# Patient Record
Sex: Female | Born: 1945
Health system: Southern US, Community
[De-identification: ages and names within clinical notes are randomized; demographics above are authoritative.]

## PROBLEM LIST (undated history)

## (undated) DIAGNOSIS — M1990 Unspecified osteoarthritis, unspecified site: Secondary | ICD-10-CM

## (undated) DIAGNOSIS — R7302 Impaired glucose tolerance (oral): Secondary | ICD-10-CM

## (undated) DIAGNOSIS — I10 Essential (primary) hypertension: Secondary | ICD-10-CM

## (undated) DIAGNOSIS — Z800 Family history of malignant neoplasm of digestive organs: Secondary | ICD-10-CM

## (undated) DIAGNOSIS — M5416 Radiculopathy, lumbar region: Secondary | ICD-10-CM

## (undated) DIAGNOSIS — M533 Sacrococcygeal disorders, not elsewhere classified: Secondary | ICD-10-CM

## (undated) DIAGNOSIS — K8020 Calculus of gallbladder without cholecystitis without obstruction: Secondary | ICD-10-CM

## (undated) DIAGNOSIS — I6529 Occlusion and stenosis of unspecified carotid artery: Secondary | ICD-10-CM

## (undated) DIAGNOSIS — E785 Hyperlipidemia, unspecified: Secondary | ICD-10-CM

## (undated) DIAGNOSIS — E039 Hypothyroidism, unspecified: Secondary | ICD-10-CM

## (undated) DIAGNOSIS — K219 Gastro-esophageal reflux disease without esophagitis: Secondary | ICD-10-CM

## (undated) DIAGNOSIS — J309 Allergic rhinitis, unspecified: Secondary | ICD-10-CM

## (undated) HISTORY — DX: Hypothyroidism, unspecified: E03.9

## (undated) HISTORY — PX: CHOLECYSTECTOMY OPEN: SUR202

## (undated) HISTORY — DX: Essential (primary) hypertension: I10

## (undated) HISTORY — DX: Occlusion and stenosis of unspecified carotid artery: I65.29

## (undated) HISTORY — DX: Calculus of gallbladder without cholecystitis without obstruction: K80.20

## (undated) HISTORY — DX: Gastro-esophageal reflux disease without esophagitis: K21.9

## (undated) HISTORY — DX: Allergic rhinitis, unspecified: J30.9

## (undated) HISTORY — DX: Family history of malignant neoplasm of digestive organs: Z80.0

## (undated) HISTORY — DX: Radiculopathy, lumbar region: M54.16

## (undated) HISTORY — DX: Impaired glucose tolerance (oral): R73.02

## (undated) HISTORY — DX: Sacrococcygeal disorders, not elsewhere classified: M53.3

## (undated) HISTORY — PX: OTHER SURGICAL HISTORY: SHX169

## (undated) HISTORY — DX: Hyperlipidemia, unspecified: E78.5

---
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
## 1999-05-07 ENCOUNTER — Encounter: Payer: Self-pay | Admitting: Emergency Medicine

## 1999-05-07 ENCOUNTER — Encounter: Admission: RE | Admit: 1999-05-07 | Discharge: 1999-05-07 | Payer: Self-pay | Admitting: Emergency Medicine

## 1999-11-27 DIAGNOSIS — N951 Menopausal and female climacteric states: Secondary | ICD-10-CM | POA: Insufficient documentation

## 1999-12-15 DIAGNOSIS — Q446 Cystic disease of liver: Secondary | ICD-10-CM | POA: Insufficient documentation

## 2000-02-25 HISTORY — PX: CHOLECYSTECTOMY: SHX55

## 2000-09-21 DIAGNOSIS — E669 Obesity, unspecified: Secondary | ICD-10-CM | POA: Insufficient documentation

## 2002-04-17 ENCOUNTER — Encounter: Payer: Self-pay | Admitting: Emergency Medicine

## 2002-04-17 ENCOUNTER — Emergency Department (HOSPITAL_COMMUNITY): Admission: EM | Admit: 2002-04-17 | Discharge: 2002-04-17 | Payer: Self-pay | Admitting: Emergency Medicine

## 2009-04-18 ENCOUNTER — Encounter: Admission: RE | Admit: 2009-04-18 | Discharge: 2009-04-18 | Payer: Self-pay | Admitting: Internal Medicine

## 2009-04-27 ENCOUNTER — Encounter: Admission: RE | Admit: 2009-04-27 | Discharge: 2009-04-27 | Payer: Self-pay | Admitting: Internal Medicine

## 2009-07-20 DIAGNOSIS — Z9049 Acquired absence of other specified parts of digestive tract: Secondary | ICD-10-CM | POA: Insufficient documentation

## 2010-06-14 DIAGNOSIS — Z79899 Other long term (current) drug therapy: Secondary | ICD-10-CM | POA: Insufficient documentation

## 2010-08-03 ENCOUNTER — Emergency Department (HOSPITAL_COMMUNITY)
Admission: EM | Admit: 2010-08-03 | Discharge: 2010-08-03 | Disposition: A | Discharge status: Home / Self Care | Payer: Medicare Other | Attending: Emergency Medicine | Admitting: Emergency Medicine

## 2010-08-03 ENCOUNTER — Emergency Department (HOSPITAL_COMMUNITY): Payer: Medicare Other

## 2010-08-03 DIAGNOSIS — F329 Major depressive disorder, single episode, unspecified: Secondary | ICD-10-CM | POA: Insufficient documentation

## 2010-08-03 DIAGNOSIS — I10 Essential (primary) hypertension: Secondary | ICD-10-CM | POA: Insufficient documentation

## 2010-08-03 DIAGNOSIS — R109 Unspecified abdominal pain: Secondary | ICD-10-CM | POA: Insufficient documentation

## 2010-08-03 DIAGNOSIS — R110 Nausea: Secondary | ICD-10-CM | POA: Insufficient documentation

## 2010-08-03 DIAGNOSIS — F3289 Other specified depressive episodes: Secondary | ICD-10-CM | POA: Insufficient documentation

## 2010-08-03 DIAGNOSIS — T148XXA Other injury of unspecified body region, initial encounter: Secondary | ICD-10-CM | POA: Insufficient documentation

## 2010-08-03 DIAGNOSIS — E039 Hypothyroidism, unspecified: Secondary | ICD-10-CM | POA: Insufficient documentation

## 2010-08-03 DIAGNOSIS — X58XXXA Exposure to other specified factors, initial encounter: Secondary | ICD-10-CM | POA: Insufficient documentation

## 2010-08-03 DIAGNOSIS — R509 Fever, unspecified: Secondary | ICD-10-CM | POA: Insufficient documentation

## 2010-08-03 LAB — CBC
HCT: 40.6 % (ref 36.0–46.0)
Hemoglobin: 13.6 g/dL (ref 12.0–15.0)
MCH: 28.4 pg (ref 26.0–34.0)
MCHC: 33.5 g/dL (ref 30.0–36.0)
MCV: 84.8 fL (ref 78.0–100.0)
Platelets: 254 10*3/uL (ref 150–400)
RBC: 4.79 MIL/uL (ref 3.87–5.11)
RDW: 14.4 % (ref 11.5–15.5)
WBC: 9.6 10*3/uL (ref 4.0–10.5)

## 2010-08-03 LAB — BASIC METABOLIC PANEL
BUN: 12 mg/dL (ref 6–23)
CO2: 25 mEq/L (ref 19–32)
Calcium: 9.9 mg/dL (ref 8.4–10.5)
Chloride: 99 mEq/L (ref 96–112)
Creatinine, Ser: 0.72 mg/dL (ref 0.4–1.2)
GFR calc Af Amer: >60 mL/min (ref >60)
GFR calc non Af Amer: >60 mL/min (ref >60)
Glucose, Bld: 122 mg/dL — ABNORMAL HIGH (ref 70–99)
Potassium: 3.8 mEq/L (ref 3.5–5.1)
Sodium: 135 mEq/L (ref 135–145)

## 2010-08-03 LAB — URINE MICROSCOPIC-ADD ON

## 2010-08-03 LAB — DIFFERENTIAL
Basophils Absolute: 0 10*3/uL (ref 0.0–0.1)
Basophils Relative: 0 % (ref 0–1)
Eosinophils Absolute: 0.1 10*3/uL (ref 0.0–0.7)
Eosinophils Relative: 1 % (ref 0–5)
Lymphocytes Relative: 28 % (ref 12–46)
Lymphs Abs: 2.7 10*3/uL (ref 0.7–4.0)
Monocytes Absolute: 1.1 10*3/uL — ABNORMAL HIGH (ref 0.1–1.0)
Monocytes Relative: 12 % (ref 3–12)
Neutro Abs: 5.7 10*3/uL (ref 1.7–7.7)
Neutrophils Relative %: 59 % (ref 43–77)

## 2010-08-03 LAB — URINALYSIS, ROUTINE W REFLEX MICROSCOPIC
Bilirubin Urine: NEGATIVE
Glucose, UA: NEGATIVE mg/dL
Hgb urine dipstick: NEGATIVE
Ketones, ur: NEGATIVE mg/dL
Nitrite: NEGATIVE
Protein, ur: NEGATIVE mg/dL
Specific Gravity, Urine: 1.013 (ref 1.005–1.030)
Urobilinogen, UA: 0.2 mg/dL (ref 0.0–1.0)
pH: 6 (ref 5.0–8.0)

## 2010-08-03 MED ORDER — IOHEXOL 300 MG/ML  SOLN
100.0000 mL | Freq: Once | INTRAMUSCULAR | Status: AC | PRN
  Administered 2010-08-03: 100 mL via INTRAVENOUS

## 2010-08-05 LAB — URINE CULTURE
Colony Count: NO GROWTH
Culture  Setup Time: 201206100139
Culture: NO GROWTH

## 2010-08-09 ENCOUNTER — Inpatient Hospital Stay (INDEPENDENT_AMBULATORY_CARE_PROVIDER_SITE_OTHER)
Admission: RE | Admit: 2010-08-09 | Discharge: 2010-08-09 | Disposition: A | Discharge status: Home / Self Care | Payer: Medicare Other | Source: Ambulatory Visit | Attending: Family Medicine | Admitting: Family Medicine

## 2010-08-09 ENCOUNTER — Emergency Department (HOSPITAL_COMMUNITY)
Admission: EM | Admit: 2010-08-09 | Discharge: 2010-08-09 | Disposition: A | Discharge status: Home / Self Care | Payer: Medicare Other | Attending: Emergency Medicine | Admitting: Emergency Medicine

## 2010-08-09 DIAGNOSIS — R609 Edema, unspecified: Secondary | ICD-10-CM | POA: Insufficient documentation

## 2010-08-09 DIAGNOSIS — I10 Essential (primary) hypertension: Secondary | ICD-10-CM | POA: Insufficient documentation

## 2010-08-09 DIAGNOSIS — R509 Fever, unspecified: Secondary | ICD-10-CM | POA: Insufficient documentation

## 2010-08-09 DIAGNOSIS — R109 Unspecified abdominal pain: Secondary | ICD-10-CM

## 2010-08-09 DIAGNOSIS — E039 Hypothyroidism, unspecified: Secondary | ICD-10-CM | POA: Insufficient documentation

## 2010-08-09 DIAGNOSIS — R209 Unspecified disturbances of skin sensation: Secondary | ICD-10-CM | POA: Insufficient documentation

## 2010-08-09 LAB — URINALYSIS, ROUTINE W REFLEX MICROSCOPIC
Bilirubin Urine: NEGATIVE
Glucose, UA: NEGATIVE mg/dL
Hgb urine dipstick: NEGATIVE
Ketones, ur: 15 mg/dL — AB
Nitrite: NEGATIVE
Protein, ur: NEGATIVE mg/dL
Specific Gravity, Urine: 1.027 (ref 1.005–1.030)
Urobilinogen, UA: 0.2 mg/dL (ref 0.0–1.0)
pH: 6 (ref 5.0–8.0)

## 2010-08-09 LAB — URINE MICROSCOPIC-ADD ON

## 2010-08-10 ENCOUNTER — Emergency Department (HOSPITAL_COMMUNITY)
Admission: EM | Admit: 2010-08-10 | Disposition: A | Discharge status: Home / Self Care | Payer: Medicare Other | Source: Home / Self Care

## 2010-08-13 ENCOUNTER — Ambulatory Visit (HOSPITAL_COMMUNITY)
Admission: RE | Admit: 2010-08-13 | Discharge: 2010-08-13 | Disposition: A | Discharge status: Home / Self Care | Payer: Medicare Other | Source: Ambulatory Visit | Attending: Emergency Medicine | Admitting: Emergency Medicine

## 2010-08-13 DIAGNOSIS — M7989 Other specified soft tissue disorders: Secondary | ICD-10-CM | POA: Insufficient documentation

## 2010-08-13 DIAGNOSIS — M79609 Pain in unspecified limb: Secondary | ICD-10-CM

## 2010-08-30 ENCOUNTER — Other Ambulatory Visit: Payer: Self-pay | Admitting: Internal Medicine

## 2010-08-30 ENCOUNTER — Other Ambulatory Visit (INDEPENDENT_AMBULATORY_CARE_PROVIDER_SITE_OTHER): Payer: Medicare Other

## 2010-08-30 ENCOUNTER — Ambulatory Visit (INDEPENDENT_AMBULATORY_CARE_PROVIDER_SITE_OTHER): Payer: Medicare Other | Admitting: Internal Medicine

## 2010-08-30 ENCOUNTER — Encounter: Payer: Self-pay | Admitting: Internal Medicine

## 2010-08-30 VITALS — BP 110/80 | HR 92 | Temp 97.2°F | Ht 65.0 in | Wt 226.0 lb

## 2010-08-30 DIAGNOSIS — Z0000 Encounter for general adult medical examination without abnormal findings: Secondary | ICD-10-CM

## 2010-08-30 DIAGNOSIS — K219 Gastro-esophageal reflux disease without esophagitis: Secondary | ICD-10-CM | POA: Insufficient documentation

## 2010-08-30 DIAGNOSIS — I10 Essential (primary) hypertension: Secondary | ICD-10-CM | POA: Insufficient documentation

## 2010-08-30 DIAGNOSIS — Z0001 Encounter for general adult medical examination with abnormal findings: Secondary | ICD-10-CM | POA: Insufficient documentation

## 2010-08-30 DIAGNOSIS — E785 Hyperlipidemia, unspecified: Secondary | ICD-10-CM

## 2010-08-30 DIAGNOSIS — K8020 Calculus of gallbladder without cholecystitis without obstruction: Secondary | ICD-10-CM | POA: Insufficient documentation

## 2010-08-30 DIAGNOSIS — Z23 Encounter for immunization: Secondary | ICD-10-CM

## 2010-08-30 DIAGNOSIS — M5416 Radiculopathy, lumbar region: Secondary | ICD-10-CM

## 2010-08-30 DIAGNOSIS — R7309 Other abnormal glucose: Secondary | ICD-10-CM

## 2010-08-30 DIAGNOSIS — I6529 Occlusion and stenosis of unspecified carotid artery: Secondary | ICD-10-CM | POA: Insufficient documentation

## 2010-08-30 DIAGNOSIS — E079 Disorder of thyroid, unspecified: Secondary | ICD-10-CM | POA: Insufficient documentation

## 2010-08-30 DIAGNOSIS — F419 Anxiety disorder, unspecified: Secondary | ICD-10-CM | POA: Insufficient documentation

## 2010-08-30 DIAGNOSIS — R5381 Other malaise: Secondary | ICD-10-CM

## 2010-08-30 DIAGNOSIS — IMO0002 Reserved for concepts with insufficient information to code with codable children: Secondary | ICD-10-CM

## 2010-08-30 DIAGNOSIS — R531 Weakness: Secondary | ICD-10-CM | POA: Insufficient documentation

## 2010-08-30 DIAGNOSIS — R5383 Other fatigue: Secondary | ICD-10-CM | POA: Insufficient documentation

## 2010-08-30 DIAGNOSIS — E039 Hypothyroidism, unspecified: Secondary | ICD-10-CM | POA: Insufficient documentation

## 2010-08-30 DIAGNOSIS — T7840XA Allergy, unspecified, initial encounter: Secondary | ICD-10-CM | POA: Insufficient documentation

## 2010-08-30 DIAGNOSIS — J309 Allergic rhinitis, unspecified: Secondary | ICD-10-CM | POA: Insufficient documentation

## 2010-08-30 DIAGNOSIS — G43109 Migraine with aura, not intractable, without status migrainosus: Secondary | ICD-10-CM | POA: Insufficient documentation

## 2010-08-30 DIAGNOSIS — R7302 Impaired glucose tolerance (oral): Secondary | ICD-10-CM | POA: Insufficient documentation

## 2010-08-30 HISTORY — DX: Hyperlipidemia, unspecified: E78.5

## 2010-08-30 HISTORY — DX: Radiculopathy, lumbar region: M54.16

## 2010-08-30 LAB — CBC WITH DIFFERENTIAL/PLATELET
Basophils Absolute: 0 10*3/uL (ref 0.0–0.1)
Basophils Relative: 0.3 % (ref 0.0–3.0)
Eosinophils Absolute: 0.2 10*3/uL (ref 0.0–0.7)
Eosinophils Relative: 2.2 % (ref 0.0–5.0)
HCT: 40 % (ref 36.0–46.0)
Hemoglobin: 13.7 g/dL (ref 12.0–15.0)
Lymphocytes Relative: 34.9 % (ref 12.0–46.0)
Lymphs Abs: 2.7 10*3/uL (ref 0.7–4.0)
MCHC: 34.2 g/dL (ref 30.0–36.0)
MCV: 85 fl (ref 78.0–100.0)
Monocytes Absolute: 0.4 10*3/uL (ref 0.1–1.0)
Monocytes Relative: 5.7 % (ref 3.0–12.0)
Neutro Abs: 4.4 10*3/uL (ref 1.4–7.7)
Neutrophils Relative %: 56.9 % (ref 43.0–77.0)
Platelets: 259 10*3/uL (ref 150.0–400.0)
RBC: 4.71 Mil/uL (ref 3.87–5.11)
RDW: 14.6 % (ref 11.5–14.6)
WBC: 7.7 10*3/uL (ref 4.5–10.5)

## 2010-08-30 LAB — HEPATIC FUNCTION PANEL
ALT: 18 U/L (ref 0–35)
AST: 17 U/L (ref 0–37)
Albumin: 4.4 g/dL (ref 3.5–5.2)
Alkaline Phosphatase: 104 U/L (ref 39–117)
Bilirubin, Direct: 0.1 mg/dL (ref 0.0–0.3)
Total Bilirubin: 0.4 mg/dL (ref 0.3–1.2)
Total Protein: 7.7 g/dL (ref 6.0–8.3)

## 2010-08-30 LAB — BASIC METABOLIC PANEL
BUN: 13 mg/dL (ref 6–23)
CO2: 29 mEq/L (ref 19–32)
Calcium: 9.1 mg/dL (ref 8.4–10.5)
Chloride: 108 mEq/L (ref 96–112)
Creatinine, Ser: 0.7 mg/dL (ref 0.4–1.2)
GFR: 92.17 mL/min (ref >60.00)
Glucose, Bld: 105 mg/dL — ABNORMAL HIGH (ref 70–99)
Potassium: 4.4 mEq/L (ref 3.5–5.1)
Sodium: 143 mEq/L (ref 135–145)

## 2010-08-30 LAB — LIPID PANEL
Cholesterol: 264 mg/dL — ABNORMAL HIGH (ref 0–200)
HDL: 51.8 mg/dL (ref >39.00)
Total CHOL/HDL Ratio: 5
Triglycerides: 275 mg/dL — ABNORMAL HIGH (ref 0.0–149.0)
VLDL: 55 mg/dL — ABNORMAL HIGH (ref 0.0–40.0)

## 2010-08-30 LAB — LDL CHOLESTEROL, DIRECT: Direct LDL: 189.9 mg/dL

## 2010-08-30 MED ORDER — PNEUMOCOCCAL VAC POLYVALENT 25 MCG/0.5ML IJ INJ
0.5000 mL | INJECTION | Freq: Once | INTRAMUSCULAR | Status: AC
  Administered 2010-08-30: 0.5 mL via INTRAMUSCULAR

## 2010-08-30 MED ORDER — TRAMADOL HCL 50 MG PO TABS: 50.0000 mg | ORAL_TABLET | Freq: Four times a day (QID) | ORAL | Status: AC | PRN

## 2010-08-30 NOTE — Assessment & Plan Note (Signed)
asympt - for a1c check today,  to f/u any worsening symptoms or concerns , cont diet and wt loss efforts

## 2010-08-30 NOTE — Assessment & Plan Note (Signed)
Not charged today, but due for pneumovax, and colonscopy - will order

## 2010-08-30 NOTE — Progress Notes (Signed)
Subjective:    Patient ID: Jenny Acosta, female    DOB: 03-21-1945, 65 y.o.   MRN: [id number]  HPI  Here to establish, was seen recently at the ER with constipation, now improved, as she is back on her thyroid med.  Has gained 75 lbs on paxil over the yrs.  Pt denies chest pain, increased sob or doe, wheezing, orthopnea, PND, increased LE swelling, palpitations, dizziness or syncope.  Pt denies new neurological symptoms such as new headache, or facial or extremity weakness or numbness   Pt denies polydipsia, polyuria. Pt continues to have recurring mild for 4 yrs now worse mod to severe right LBP/coccyx pain without recnet change in severity, bowel or bladder change, fever, wt loss,  worsening LE pain/numbness/weakness except for mild RLE numb, but no gait change or falls.  Does have some ? Gastric irritation with the ibuprofen (but no hx of PUD).  Has not seen her prev PCP recently, robaxin, valium and percocet taken in the past, but robaxin caused hallucinations and valium not really helpful except for anxiety.  Does have sense of ongoing fatigue, but denies signficant hypersomnolence.  Past Medical History  Diagnosis Date  . Pain, coccyx   . GERD (gastroesophageal reflux disease)   . Hypertension   . Hypothyroidism   . Gallstone   . Carotid stenosis   . Impaired glucose tolerance   . Allergic rhinitis, cause unspecified    Past Surgical History  Procedure Date  . Cholecystectomy 2002  . Appendectomy 1953  . Tonsillectomy 1959  . Scarlet fever     age 84    reports that she has never smoked. She does not have any smokeless tobacco history on file. She reports that she does not drink alcohol or use illicit drugs. family history includes Arthritis in her other; Cancer in her other; Diabetes in her others; Hyperlipidemia in her other; and Stroke in her other. Allergies  Allergen Reactions  . Robaxin Other (See Comments)    hallucinations  . Sulfa Antibiotics    No current outpatient  prescriptions on file prior to visit.   Review of Systems Review of Systems  Constitutional: Negative for diaphoresis and unexpected weight change.  HENT: Negative for drooling and tinnitus.   Eyes: Negative for photophobia and visual disturbance.  Respiratory: Negative for choking and stridor.   Gastrointestinal: Negative for vomiting and blood in stool.  Genitourinary: Negative for hematuria and decreased urine volume.  Musculoskeletal: Negative for gait problem.  Skin: Negative for color change and wound.  Neurological: Negative for tremors and numbness.  Psychiatric/Behavioral: Negative for decreased concentration. The patient is not hyperactive.       Objective:   Physical Exam BP 110/80  Pulse 92  Temp(Src) 97.2 F (36.2 C) (Oral)  Ht 5\' 5"  (1.651 m)  Wt 226 lb (102.513 kg)  BMI 37.61 kg/m2  SpO2 94% Physical Exam  VS noted Constitutional: Pt is oriented to person, place, and time. Appears well-developed and well-nourished.  HENT:  Head: Normocephalic and atraumatic.  Right Ear: External ear normal.  Left Ear: External ear normal.  Nose: Nose normal.  Mouth/Throat: Oropharynx is clear and moist.  Eyes: Conjunctivae and EOM are normal. Pupils are equal, round, and reactive to light.  Neck: Normal range of motion. Neck supple. No JVD present. No tracheal deviation present.  Cardiovascular: Normal rate, regular rhythm, normal heart sounds and intact distal pulses.   Pulmonary/Chest: Effort normal and breath sounds normal.  Abdominal: Soft. Bowel sounds are normal. There  is no tenderness.  Musculoskeletal: Normal range of motion. Exhibits no edema.  Lymphadenopathy:  Has no cervical adenopathy.  Neurological: Pt is alert and oriented to person, place, and time. Pt has normal reflexes. No cranial nerve deficit. Motor/dtr/gait intact; sens decreased to anterior right thigh and medial calf Skin: Skin is warm and dry. No rash noted.  Psychiatric:  Has  normal mood and affect.  Behavior is normal.  2+ nervous       Assessment & Plan:

## 2010-08-30 NOTE — Assessment & Plan Note (Signed)
stable overall by hx and exam, most recent data reviewed with pt, and pt to continue medical treatment as before  BP Readings from Last 3 Encounters:  08/30/10 110/80

## 2010-08-30 NOTE — Patient Instructions (Addendum)
Please stop the ibuprofen Take all new medications as prescribed  - the ultram You will be contacted regarding the referral for: MRI for the lower back, and orthopedic Please remember to followup with your GYN for the yearly pap smear and/or mammogram (consider calling [facility]) Please go to LAB in the Basement for the blood and/or urine tests to be done today Please call the phone number [phone] (the PhoneTree System) for results of testing in 2-3 days;  When calling, simply dial the number, and when prompted enter the MRN number above (the Medical Record Number) and the # key, then the message should start. You will be contacted regarding the referral for: colonoscopy You had the pneumonia shot today Please return in 6 months, or sooner if needed

## 2010-08-30 NOTE — Assessment & Plan Note (Signed)
stable overall by hx and exam, most recent data reviewed with pt, and pt to continue medical treatment as before, to check labs

## 2010-08-30 NOTE — Assessment & Plan Note (Signed)
Etiology unclear, Exam otherwise benign, to check labs as documented, follow with expectant management  

## 2010-08-30 NOTE — Assessment & Plan Note (Signed)
Mild to mod, ongoing for 4 yrs, mild worse with RLE numbness now, for MRI, and ortho evaluation

## 2010-09-02 ENCOUNTER — Other Ambulatory Visit: Payer: Self-pay

## 2010-09-02 ENCOUNTER — Other Ambulatory Visit: Payer: Self-pay | Admitting: Internal Medicine

## 2010-09-02 LAB — TSH: TSH: 0.7 u[IU]/mL (ref 0.35–5.50)

## 2010-09-02 MED ORDER — ATORVASTATIN CALCIUM 20 MG PO TABS: 20.0000 mg | ORAL_TABLET | Freq: Every day | ORAL | Status: DC

## 2010-09-02 MED ORDER — ESCITALOPRAM OXALATE 10 MG PO TABS: 10.0000 mg | ORAL_TABLET | Freq: Every day | ORAL | Status: DC

## 2010-09-02 MED ORDER — LEVOTHYROXINE SODIUM 75 MCG PO TABS: 75.0000 ug | ORAL_TABLET | Freq: Every day | ORAL | Status: DC

## 2010-09-10 ENCOUNTER — Ambulatory Visit
Admission: RE | Admit: 2010-09-10 | Discharge: 2010-09-10 | Disposition: A | Discharge status: Home / Self Care | Payer: Medicare Other | Source: Ambulatory Visit | Attending: Internal Medicine | Admitting: Internal Medicine

## 2010-09-10 DIAGNOSIS — M5416 Radiculopathy, lumbar region: Secondary | ICD-10-CM

## 2010-10-03 ENCOUNTER — Ambulatory Visit: Payer: Medicare Other | Admitting: Internal Medicine

## 2010-11-08 ENCOUNTER — Encounter: Payer: Self-pay | Admitting: Gastroenterology

## 2010-12-05 ENCOUNTER — Other Ambulatory Visit: Payer: Self-pay | Admitting: Internal Medicine

## 2010-12-05 NOTE — Telephone Encounter (Signed)
Done per emr 

## 2010-12-16 ENCOUNTER — Telehealth: Payer: Self-pay

## 2010-12-16 MED ORDER — FLUOXETINE HCL 20 MG PO CAPS: 20.0000 mg | ORAL_CAPSULE | Freq: Every day | ORAL | Status: DC

## 2010-12-16 NOTE — Telephone Encounter (Signed)
Change to prozac ok - done per emr

## 2010-12-16 NOTE — Telephone Encounter (Signed)
Called the patient left message that prescription request had been taken care of and sent to pharmacy.

## 2010-12-16 NOTE — Telephone Encounter (Signed)
The patient called requesting an alternative to Lexapro as is too expensive. She stated that prozac she could afford and would it be a good alternative for lexapro. Please advise, phone number [phone]

## 2011-02-27 ENCOUNTER — Other Ambulatory Visit: Payer: Self-pay | Admitting: Internal Medicine

## 2011-02-27 ENCOUNTER — Other Ambulatory Visit (INDEPENDENT_AMBULATORY_CARE_PROVIDER_SITE_OTHER): Payer: Medicare Other

## 2011-02-27 ENCOUNTER — Encounter: Payer: Self-pay | Admitting: Internal Medicine

## 2011-02-27 ENCOUNTER — Ambulatory Visit (INDEPENDENT_AMBULATORY_CARE_PROVIDER_SITE_OTHER): Payer: Medicare Other | Admitting: Internal Medicine

## 2011-02-27 VITALS — BP 108/72 | HR 90 | Temp 98.9°F | Ht 66.0 in | Wt 222.1 lb

## 2011-02-27 DIAGNOSIS — K219 Gastro-esophageal reflux disease without esophagitis: Secondary | ICD-10-CM

## 2011-02-27 DIAGNOSIS — M549 Dorsalgia, unspecified: Secondary | ICD-10-CM

## 2011-02-27 DIAGNOSIS — F411 Generalized anxiety disorder: Secondary | ICD-10-CM | POA: Diagnosis not present

## 2011-02-27 DIAGNOSIS — Z23 Encounter for immunization: Secondary | ICD-10-CM | POA: Diagnosis not present

## 2011-02-27 DIAGNOSIS — E785 Hyperlipidemia, unspecified: Secondary | ICD-10-CM | POA: Diagnosis not present

## 2011-02-27 DIAGNOSIS — F419 Anxiety disorder, unspecified: Secondary | ICD-10-CM

## 2011-02-27 MED ORDER — TRAMADOL HCL 50 MG PO TABS: 100.0000 mg | ORAL_TABLET | Freq: Four times a day (QID) | ORAL | Status: DC | PRN

## 2011-02-27 MED ORDER — CYCLOBENZAPRINE HCL 5 MG PO TABS: 10.0000 mg | ORAL_TABLET | Freq: Every evening | ORAL | Status: AC | PRN

## 2011-02-27 MED ORDER — OMEPRAZOLE 20 MG PO CPDR: 20.0000 mg | DELAYED_RELEASE_CAPSULE | Freq: Every day | ORAL | Status: DC

## 2011-02-27 MED ORDER — PAROXETINE HCL 20 MG PO TABS: 20.0000 mg | ORAL_TABLET | ORAL | Status: DC

## 2011-02-27 NOTE — Assessment & Plan Note (Signed)
stable overall by hx and exam, most recent data reviewed with pt, not taking statin currently - for lipids, consider statin for ldl > 70

## 2011-02-27 NOTE — Assessment & Plan Note (Signed)
Not controlled with zantac, for PPI tx,  to f/u any worsening symptoms or concerns, consider GI for worsening or persistent s/s such as dysphagia, decliens GI for now

## 2011-02-27 NOTE — Assessment & Plan Note (Signed)
stable overall by hx and exam, most recent data reviewed with pt, and pt to continue medical treatment as before  Lab Results  Component Value Date   WBC 7.7 08/30/2010   HGB 13.7 08/30/2010   HCT 40.0 08/30/2010   PLT 259.0 08/30/2010   GLUCOSE 105* 08/30/2010   CHOL 264* 08/30/2010   TRIG 275.0* 08/30/2010   HDL 51.80 08/30/2010   LDLDIRECT 189.9 08/30/2010   ALT 18 08/30/2010   AST 17 08/30/2010   NA 143 08/30/2010   K 4.4 08/30/2010   CL 108 08/30/2010   CREATININE 0.7 08/30/2010   BUN 13 08/30/2010   CO2 29 08/30/2010   TSH 0.70 08/30/2010   To cont the paxil for now

## 2011-02-27 NOTE — Patient Instructions (Addendum)
You had the flu shot today OK to increase the ultram to 100 mg as needed (new prescription sent to the pharmacy) Ok to stop the lipitor and the lexapro as you have Take all new medications as prescribed - the flexeril muscle relaxer as needed, and the prilosec Please go to LAB in the Basement for the blood and/or urine tests to be done today - the cholesterol only Please call the phone number [phone] (the PhoneTree System) for results of testing in 2-3 days;  When calling, simply dial the number, and when prompted enter the MRN number above (the Medical Record Number) and the # key, then the message should start. Please return in 6 months, or sooner if needed

## 2011-02-27 NOTE — Assessment & Plan Note (Signed)
No neuro change apprec today - for tylenol prn, cont tramadol - consider incr to 100 mg, add flexeril 10 qhs

## 2011-02-27 NOTE — Progress Notes (Signed)
Subjective:    Patient ID: Jenny Acosta, female    DOB: [birth year], 66 y.o.   MRN: [id number]  HPI  Here to f/u; overall doing ok, does have some ongoing left rotater cuff pain, and lately with left post neck stiffness as well, without radiation or increased pain, worse in the am, as well as recurring lower back stiffness as well, worse in the am, but also overnight, hard to find a good position to sleep at night.  Has worsening reflux with very mild sense of trouble swallowing in the last month, where zantac no longer seems to be controlling.  No vomiting, wt loss, abd pain, blood.  Did not start the statin after last visit - trying to work on diet first but admits she has not been able to make signficiant change so far.  Would like to check lipids though.  Also was not able to tolerate wean off paxil and change to prozac as it seemed to make her anxiety worse, so is back on the paxil, though she worries about wt gain, and feels more anxious even to miss the paxil dosing daily by even a few hours.  Pt denies chest pain, increased sob or doe, wheezing, orthopnea, PND, increased LE swelling, palpitations, dizziness or syncope. Pt denies new neurological symptoms such as new headache, or facial or extremity weakness or numbness  Pt denies polydipsia, polyuria Past Medical History  Diagnosis Date  . Pain, coccyx   . GERD (gastroesophageal reflux disease)   . Hypertension   . Hypothyroidism   . Gallstone   . Carotid stenosis   . Impaired glucose tolerance   . Allergic rhinitis, cause unspecified   . Right lumbar radiculopathy 08/30/2010  . Hyperlipidemia 08/30/2010   Past Surgical History  Procedure Date  . Cholecystectomy 2002  . Appendectomy 1953  . Tonsillectomy 1959  . Scarlet fever     age 57    reports that she has never smoked. She does not have any smokeless tobacco history on file. She reports that she does not drink alcohol or use illicit drugs. family history includes Arthritis in her  other; Cancer in her other; Diabetes in her others; Hyperlipidemia in her other; and Stroke in her other. Allergies  Allergen Reactions  . Robaxin Other (See Comments)    hallucinations  . Sulfa Antibiotics    Current Outpatient Prescriptions on File Prior to Visit  Medication Sig Dispense Refill  . Glucosamine-Chondroitin (MOVE FREE PO) Take by mouth daily.        Marland Kitchen levothyroxine (SYNTHROID) 75 MCG tablet Take 1 tablet (75 mcg total) by mouth daily.  30 tablet  11  . Lutein 20 MG CAPS Take by mouth daily.        . ranitidine (ACID REDUCER MAXIMUM STRENGTH) 150 MG tablet Take 150 mg by mouth daily.         aReview of Systems Review of Systems  Constitutional: Negative for diaphoresis and unexpected weight change.  HENT: Negative for drooling and tinnitus.   Eyes: Negative for photophobia and visual disturbance.  Respiratory: Negative for choking and stridor.   Gastrointestinal: Negative for vomiting and blood in stool.  Genitourinary: Negative for hematuria and decreased urine volume.    Objective:   Physical Exam BP 108/72  Pulse 90  Temp(Src) 98.9 F (37.2 C) (Oral)  Ht 5\' 6"  (1.676 m)  Wt 222 lb 2 oz (100.755 kg)  BMI 35.85 kg/m2  SpO2 97% Physical Exam  VS noted Constitutional: Pt appears well-developed  and well-nourished.  HENT: Head: Normocephalic.  Right Ear: External ear normal.  Left Ear: External ear normal.  Eyes: Conjunctivae and EOM are normal. Pupils are equal, round, and reactive to light.  Neck: Normal range of motion. Neck supple.  Cardiovascular: Normal rate and regular rhythm.   Pulmonary/Chest: Effort normal and breath sounds normal.  Abd:  Soft, NT, non-distended, + BS Neurological: Pt is alert. No cranial nerve deficit. motor/sens/dtr intact, spine nontender Skin: Skin is warm. No erythema.  Psychiatric: Pt behavior is normal. Thought content normal. 2+ nervous    Assessment & Plan:

## 2011-02-28 LAB — LIPID PANEL
Cholesterol: 283 mg/dL — ABNORMAL HIGH (ref 0–200)
HDL: 52.6 mg/dL (ref >39.00)
Total CHOL/HDL Ratio: 5
Triglycerides: 225 mg/dL — ABNORMAL HIGH (ref 0.0–149.0)
VLDL: 45 mg/dL — ABNORMAL HIGH (ref 0.0–40.0)

## 2011-02-28 LAB — LDL CHOLESTEROL, DIRECT: Direct LDL: 191.5 mg/dL

## 2011-03-01 ENCOUNTER — Encounter: Payer: Self-pay | Admitting: Internal Medicine

## 2011-03-01 ENCOUNTER — Other Ambulatory Visit: Payer: Self-pay | Admitting: Internal Medicine

## 2011-03-01 MED ORDER — ATORVASTATIN CALCIUM 20 MG PO TABS: 20.0000 mg | ORAL_TABLET | Freq: Every day | ORAL | Status: DC

## 2011-03-06 ENCOUNTER — Telehealth: Payer: Self-pay

## 2011-03-06 MED ORDER — LOVASTATIN 40 MG PO TABS: 40.0000 mg | ORAL_TABLET | Freq: Every day | ORAL | Status: DC

## 2011-03-06 NOTE — Telephone Encounter (Signed)
Ok for lovastatin 40

## 2011-03-06 NOTE — Telephone Encounter (Signed)
Called the patient no answer 

## 2011-03-06 NOTE — Telephone Encounter (Signed)
Patient called requesting less expensive alternative to Lipitor to start on, please advise

## 2011-03-07 NOTE — Telephone Encounter (Signed)
Called the patient no answer and no voice mail 

## 2011-05-15 ENCOUNTER — Telehealth: Payer: Self-pay

## 2011-05-15 NOTE — Telephone Encounter (Signed)
I think ok to cont both meds at this point, as the only medications stronger than tramadol are controlled substances like vicodin and percocet, which I dont think are approp for her case, and I would very much doubt the flexeril is causing the facial swelling

## 2011-05-15 NOTE — Telephone Encounter (Signed)
Patient has taken flexeril and is having facial swelling, please advise on alternative. Also stated the Tramadol 100 mg has helped with the pain,  But needs something stronger. Please advisement on both medications.

## 2011-05-16 NOTE — Telephone Encounter (Signed)
Called left message to call back 

## 2011-05-16 NOTE — Telephone Encounter (Signed)
Patient informed of MD's instructions

## 2011-06-16 ENCOUNTER — Ambulatory Visit: Payer: Medicare Other | Admitting: Internal Medicine

## 2011-07-24 ENCOUNTER — Encounter: Payer: Self-pay | Admitting: Internal Medicine

## 2011-07-24 ENCOUNTER — Ambulatory Visit (INDEPENDENT_AMBULATORY_CARE_PROVIDER_SITE_OTHER): Payer: Medicare Other | Admitting: Internal Medicine

## 2011-07-24 VITALS — BP 124/82 | HR 96 | Temp 99.1°F | Ht 65.0 in | Wt 216.5 lb

## 2011-07-24 DIAGNOSIS — R42 Dizziness and giddiness: Secondary | ICD-10-CM | POA: Diagnosis not present

## 2011-07-24 DIAGNOSIS — E785 Hyperlipidemia, unspecified: Secondary | ICD-10-CM | POA: Diagnosis not present

## 2011-07-24 DIAGNOSIS — J019 Acute sinusitis, unspecified: Secondary | ICD-10-CM | POA: Diagnosis not present

## 2011-07-24 DIAGNOSIS — J069 Acute upper respiratory infection, unspecified: Secondary | ICD-10-CM | POA: Diagnosis not present

## 2011-07-24 DIAGNOSIS — W19XXXA Unspecified fall, initial encounter: Secondary | ICD-10-CM

## 2011-07-24 MED ORDER — MECLIZINE HCL 12.5 MG PO TABS: 12.5000 mg | ORAL_TABLET | Freq: Three times a day (TID) | ORAL | Status: DC | PRN

## 2011-07-24 MED ORDER — TRAMADOL HCL 50 MG PO TABS: 100.0000 mg | ORAL_TABLET | Freq: Four times a day (QID) | ORAL | Status: DC | PRN

## 2011-07-24 MED ORDER — LEVOFLOXACIN 250 MG PO TABS: 250.0000 mg | ORAL_TABLET | Freq: Every day | ORAL | Status: AC

## 2011-07-24 NOTE — Patient Instructions (Signed)
Take all new medications as prescribed  - the antibiotic, and the meclizine for dizziness if needed Continue all other medications as before, including the tramadol OK to stay off the lovastatin for now Please return in 6 months, or sooner if needed

## 2011-07-27 ENCOUNTER — Encounter: Payer: Self-pay | Admitting: Internal Medicine

## 2011-07-27 DIAGNOSIS — Y92009 Unspecified place in unspecified non-institutional (private) residence as the place of occurrence of the external cause: Secondary | ICD-10-CM | POA: Insufficient documentation

## 2011-07-27 DIAGNOSIS — J069 Acute upper respiratory infection, unspecified: Secondary | ICD-10-CM | POA: Insufficient documentation

## 2011-07-27 DIAGNOSIS — W19XXXA Unspecified fall, initial encounter: Secondary | ICD-10-CM | POA: Insufficient documentation

## 2011-07-27 NOTE — Assessment & Plan Note (Signed)
Mild to mod, for antibx course,  to f/u any worsening symptoms or concerns 

## 2011-07-27 NOTE — Assessment & Plan Note (Signed)
Mild to mod, for meclizien prn,  to f/u any worsening symptoms or concerns

## 2011-07-27 NOTE — Progress Notes (Signed)
Subjective:    Patient ID: Jenny Acosta, female    DOB: Jun 10, 1945, 66 y.o.   MRN: [id number]  HPI  Here to f/u;  Overall not taking the lovastatin as we asked after last visit as she was "wary" of potential side effect.  Has had vertigo recurrent intermittent for 3 wks and some fullness to the ears, left > right, but improving, and no vertigo for the last 3 days.  Did accidentrly trip yesterday with a fall forward, today mentions an abrasion to the right palm and contusion to the right knee, with stiffness and discomfort about the knee today.  Is trying to lose wt, now down from 222 to 216.  Pt denies chest pain, increased sob or doe, wheezing, orthopnea, PND, increased LE swelling, palpitations, dizziness or syncope.  Pt denies new neurological symptoms such as new headache, or facial or extremity weakness or numbness   Pt denies polydipsia, polyuria. Past Medical History  Diagnosis Date  . Pain, coccyx   . GERD (gastroesophageal reflux disease)   . Hypertension   . Hypothyroidism   . Gallstone   . Carotid stenosis   . Impaired glucose tolerance   . Allergic rhinitis, cause unspecified   . Right lumbar radiculopathy 08/30/2010  . Hyperlipidemia 08/30/2010   Past Surgical History  Procedure Date  . Cholecystectomy 2002  . Appendectomy 1953  . Tonsillectomy 1959  . Scarlet fever     age 46    reports that she has never smoked. She does not have any smokeless tobacco history on file. She reports that she does not drink alcohol or use illicit drugs. family history includes Arthritis in her other; Cancer in her other; Diabetes in her others; Hyperlipidemia in her other; and Stroke in her other. Allergies  Allergen Reactions  . Methocarbamol Other (See Comments)    hallucinations  . Sulfa Antibiotics    Current Outpatient Prescriptions on File Prior to Visit  Medication Sig Dispense Refill  . Glucosamine-Chondroitin (MOVE FREE PO) Take by mouth daily.        Marland Kitchen levothyroxine (SYNTHROID)  75 MCG tablet Take 1 tablet (75 mcg total) by mouth daily.  30 tablet  11  . Lutein 20 MG CAPS Take by mouth daily.        Marland Kitchen PARoxetine (PAXIL) 20 MG tablet Take 1 tablet (20 mg total) by mouth every morning.  90 tablet  3  . lovastatin (MEVACOR) 40 MG tablet Take 1 tablet (40 mg total) by mouth at bedtime.  90 tablet  3  . omeprazole (PRILOSEC) 20 MG capsule Take 1 capsule (20 mg total) by mouth daily.  30 capsule  11  . ranitidine (ACID REDUCER MAXIMUM STRENGTH) 150 MG tablet Take 150 mg by mouth daily.         Review of Systems Review of Systems  Constitutional: Negative for diaphoresis and unexpected weight change.  HENT: Negative for drooling and tinnitus.   Eyes: Negative for photophobia and visual disturbance.  Respiratory: Negative for choking and stridor.   Gastrointestinal: Negative for vomiting and blood in stool.  Genitourinary: Negative for hematuria and decreased urine volume.  Skin: Negative for color change and wound.  Neurological: Negative for tremors and numbness.     Objective:   Physical Exam BP 124/82  Pulse 96  Temp(Src) 99.1 F (37.3 C) (Oral)  Ht 5\' 5"  (1.651 m)  Wt 216 lb 8 oz (98.204 kg)  BMI 36.03 kg/m2  SpO2 97% Physical Exam  VS noted Constitutional: Pt  appears well-developed and well-nourished.  HENT: Head: Normocephalic.  Right Ear: External ear normal.  Left Ear: External ear normal.  Bilat tm's mild erythema left > right.  Sinus nontender.  Pharynx mild erythema Eyes: Conjunctivae and EOM are normal. Pupils are equal, round, and reactive to light.  Neck: Normal range of motion. Neck supple.  Cardiovascular: Normal rate and regular rhythm.   Pulmonary/Chest: Effort normal and breath sounds normal.  Neurological: Pt is alert. Not confused Skin: Skin is warm. No erythema. no rash but has 1-2 cm abrasion without cellulitis to the right wrist/palm, also 2 cm right knee contusion with mild tender, swelling Psychiatric: Pt behavior is normal. Thought  content normal.     Assessment & Plan:

## 2011-07-27 NOTE — Assessment & Plan Note (Signed)
Yesterday, accidental, with right wrist abrasion/right knee contusion, benign exam, no films needed, for tylenol prn, pt reassured,  to f/u any worsening symptoms or concerns

## 2011-07-27 NOTE — Assessment & Plan Note (Signed)
Given her carotid dz , goal LDL < 70, but declines statin at this time, cont low chol diet,  to f/u any worsening symptoms or concerns

## 2011-08-11 ENCOUNTER — Telehealth: Payer: Self-pay | Admitting: *Deleted

## 2011-08-11 MED ORDER — AZITHROMYCIN 250 MG PO TABS: ORAL_TABLET | ORAL | Status: AC

## 2011-08-11 NOTE — Telephone Encounter (Signed)
Done per emr 

## 2011-08-11 NOTE — Telephone Encounter (Signed)
Pt left vm stating she finished her abx last week. She still c/o earache and sinus issues. She is requesting a different abx.

## 2011-08-12 NOTE — Telephone Encounter (Signed)
Called left detailed message antibiotic sent to pharmacy

## 2011-09-22 ENCOUNTER — Other Ambulatory Visit: Payer: Self-pay | Admitting: Internal Medicine

## 2011-10-02 ENCOUNTER — Other Ambulatory Visit: Payer: Self-pay | Admitting: Internal Medicine

## 2011-10-10 ENCOUNTER — Telehealth: Payer: Self-pay

## 2011-10-10 MED ORDER — TIZANIDINE HCL 4 MG PO TABS: 4.0000 mg | ORAL_TABLET | Freq: Four times a day (QID) | ORAL | Status: AC | PRN

## 2011-10-10 NOTE — Telephone Encounter (Signed)
Patient is taking tramadol (100mg  ) and tylenol every 6 hours for arthritis pain. Her pain has increased and having muscle spasms.  Please advise on muscle relaxer or more meds. Please advise.  Call back number is [phone], [facility].

## 2011-10-10 NOTE — Telephone Encounter (Signed)
Pt has had rxn to robaxin, but should do ok with tizanidine prn - done erx

## 2011-10-13 ENCOUNTER — Telehealth: Payer: Self-pay

## 2011-10-13 NOTE — Telephone Encounter (Signed)
Called the patient left detailed message that prescription requested has been sent in.

## 2011-10-13 NOTE — Telephone Encounter (Signed)
Received PA approval for cyclobenzaprine through February 24, 2012. ID# [id number].

## 2011-11-27 ENCOUNTER — Encounter (HOSPITAL_COMMUNITY): Payer: Self-pay | Admitting: Emergency Medicine

## 2011-11-27 ENCOUNTER — Emergency Department (HOSPITAL_COMMUNITY): Payer: Medicare Other

## 2011-11-27 ENCOUNTER — Emergency Department (HOSPITAL_COMMUNITY)
Admission: EM | Admit: 2011-11-27 | Discharge: 2011-11-27 | Disposition: A | Discharge status: Home / Self Care | Payer: Medicare Other | Attending: Emergency Medicine | Admitting: Emergency Medicine

## 2011-11-27 DIAGNOSIS — I10 Essential (primary) hypertension: Secondary | ICD-10-CM | POA: Diagnosis not present

## 2011-11-27 DIAGNOSIS — M542 Cervicalgia: Secondary | ICD-10-CM | POA: Insufficient documentation

## 2011-11-27 DIAGNOSIS — R112 Nausea with vomiting, unspecified: Secondary | ICD-10-CM | POA: Diagnosis not present

## 2011-11-27 DIAGNOSIS — M129 Arthropathy, unspecified: Secondary | ICD-10-CM | POA: Diagnosis not present

## 2011-11-27 DIAGNOSIS — K219 Gastro-esophageal reflux disease without esophagitis: Secondary | ICD-10-CM | POA: Insufficient documentation

## 2011-11-27 DIAGNOSIS — R0602 Shortness of breath: Secondary | ICD-10-CM | POA: Insufficient documentation

## 2011-11-27 DIAGNOSIS — M79609 Pain in unspecified limb: Secondary | ICD-10-CM | POA: Insufficient documentation

## 2011-11-27 DIAGNOSIS — G319 Degenerative disease of nervous system, unspecified: Secondary | ICD-10-CM | POA: Diagnosis not present

## 2011-11-27 DIAGNOSIS — E785 Hyperlipidemia, unspecified: Secondary | ICD-10-CM | POA: Insufficient documentation

## 2011-11-27 DIAGNOSIS — I6789 Other cerebrovascular disease: Secondary | ICD-10-CM | POA: Diagnosis not present

## 2011-11-27 DIAGNOSIS — Z79899 Other long term (current) drug therapy: Secondary | ICD-10-CM | POA: Insufficient documentation

## 2011-11-27 DIAGNOSIS — R079 Chest pain, unspecified: Secondary | ICD-10-CM | POA: Insufficient documentation

## 2011-11-27 DIAGNOSIS — R42 Dizziness and giddiness: Secondary | ICD-10-CM | POA: Diagnosis not present

## 2011-11-27 DIAGNOSIS — E039 Hypothyroidism, unspecified: Secondary | ICD-10-CM | POA: Diagnosis not present

## 2011-11-27 HISTORY — DX: Unspecified osteoarthritis, unspecified site: M19.90

## 2011-11-27 LAB — COMPREHENSIVE METABOLIC PANEL
ALT: 18 U/L (ref 0–35)
AST: 17 U/L (ref 0–37)
Albumin: 3.7 g/dL (ref 3.5–5.2)
Alkaline Phosphatase: 134 U/L — ABNORMAL HIGH (ref 39–117)
BUN: 7 mg/dL (ref 6–23)
CO2: 24 mEq/L (ref 19–32)
Calcium: 9.7 mg/dL (ref 8.4–10.5)
Chloride: 102 mEq/L (ref 96–112)
Creatinine, Ser: 0.57 mg/dL (ref 0.50–1.10)
GFR calc Af Amer: >90 mL/min (ref >90)
GFR calc non Af Amer: >90 mL/min (ref >90)
Glucose, Bld: 136 mg/dL — ABNORMAL HIGH (ref 70–99)
Potassium: 3.5 mEq/L (ref 3.5–5.1)
Sodium: 138 mEq/L (ref 135–145)
Total Bilirubin: 0.2 mg/dL — ABNORMAL LOW (ref 0.3–1.2)
Total Protein: 7.3 g/dL (ref 6.0–8.3)

## 2011-11-27 LAB — CBC WITH DIFFERENTIAL/PLATELET
Basophils Absolute: 0 10*3/uL (ref 0.0–0.1)
Basophils Relative: 0 % (ref 0–1)
Eosinophils Absolute: 0.1 10*3/uL (ref 0.0–0.7)
Eosinophils Relative: 2 % (ref 0–5)
HCT: 43.5 % (ref 36.0–46.0)
Hemoglobin: 14.3 g/dL (ref 12.0–15.0)
Lymphocytes Relative: 44 % (ref 12–46)
Lymphs Abs: 2.7 10*3/uL (ref 0.7–4.0)
MCH: 27.9 pg (ref 26.0–34.0)
MCHC: 32.9 g/dL (ref 30.0–36.0)
MCV: 85 fL (ref 78.0–100.0)
Monocytes Absolute: 0.5 10*3/uL (ref 0.1–1.0)
Monocytes Relative: 8 % (ref 3–12)
Neutro Abs: 2.8 10*3/uL (ref 1.7–7.7)
Neutrophils Relative %: 46 % (ref 43–77)
Platelets: 241 10*3/uL (ref 150–400)
RBC: 5.12 MIL/uL — ABNORMAL HIGH (ref 3.87–5.11)
RDW: 15 % (ref 11.5–15.5)
WBC: 6.1 10*3/uL (ref 4.0–10.5)

## 2011-11-27 LAB — POCT I-STAT TROPONIN I: Troponin i, poc: 0 ng/mL (ref 0.00–0.08)

## 2011-11-27 MED ORDER — MECLIZINE HCL 25 MG PO TABS
25.0000 mg | ORAL_TABLET | Freq: Once | ORAL | Status: AC
  Administered 2011-11-27: 25 mg via ORAL
  Filled 2011-11-27: qty 1

## 2011-11-27 MED ORDER — MECLIZINE HCL 25 MG PO TABS: 25.0000 mg | ORAL_TABLET | Freq: Four times a day (QID) | ORAL | Status: DC

## 2011-11-27 NOTE — ED Notes (Signed)
Pt reports that she has been having dizziness, L arm pain, cp since yesterday; but reports long standing hx of same with tendonitis in L shoulder, and has been on meclizine in the past for dizziness; pt a&ox4; neuro intact

## 2011-11-27 NOTE — ED Provider Notes (Addendum)
History     CSN: [id number]  Arrival date & time 11/27/11  1630   First MD Initiated Contact with Patient 11/27/11 1813      Chief Complaint  Patient presents with  . Dizziness  . Shortness of Breath  . Chest Pain    (Consider location/radiation/quality/duration/timing/severity/associated sxs/prior treatment) Patient is a 66 y.o. female presenting with shortness of breath and chest pain. The history is provided by the patient and a relative.  Shortness of Breath  Associated symptoms include chest pain and shortness of breath. Pertinent negatives include no fever.  Chest Pain Primary symptoms include shortness of breath, nausea, vomiting and dizziness. Pertinent negatives for primary symptoms include no fever and no abdominal pain.  Dizziness also occurs with nausea and vomiting. Dizziness does not occur with weakness.   Pertinent negatives for associated symptoms include no weakness.    patient's main complaint is room spinning and vertigo that started yesterday she is also had some left arm pain for the past 2 days she's had some pain in that arm for a long period time but worse in the past 2 days. No real anterior chest pain. With the dizziness there has been nausea and vomiting. Patient's had a history dizziness in the past treated by her primary care Dr. with Lezlie Octave but never really had true room spinning. Also 4 months ago had a left-sided ear infection that required several treatments to clear up. The left arm pain is more of an ache he just in the left arm. Scribed at about 2/10. Nursing note noted some middle portion chest pain now patient didn't relate any of that to [place].  Past Medical History  Diagnosis Date  . Pain, coccyx   . GERD (gastroesophageal reflux disease)   . Hypertension   . Hypothyroidism   . Gallstone   . Carotid stenosis   . Impaired glucose tolerance   . Allergic rhinitis, cause unspecified   . Right lumbar radiculopathy 08/30/2010  . Hyperlipidemia  08/30/2010  . Arthritis     Past Surgical History  Procedure Date  . Cholecystectomy 2002  . Appendectomy 1953  . Tonsillectomy 1959  . Scarlet fever     age 45  . Cholecystectomy open 10 years ago    Family History  Problem Relation Age of Onset  . Cancer Other     colon cancer  . Stroke Other   . Diabetes Other   . Arthritis Other   . Hyperlipidemia Other   . Diabetes Other     History  Substance Use Topics  . Smoking status: Never Smoker   . Smokeless tobacco: Not on file  . Alcohol Use: No    OB History    Grav Para Term Preterm Abortions TAB SAB Ect Mult Living                  Review of Systems  Constitutional: Negative for fever.  HENT: Positive for neck pain. Negative for ear pain and congestion.   Eyes: Negative for visual disturbance.  Respiratory: Positive for shortness of breath.   Cardiovascular: Positive for chest pain.  Gastrointestinal: Positive for nausea and vomiting. Negative for abdominal pain.  Genitourinary: Negative for dysuria.  Musculoskeletal: Negative for back pain.  Skin: Negative for rash.  Neurological: Positive for dizziness. Negative for speech difficulty, weakness and headaches.  Hematological: Does not bruise/bleed easily.    Allergies  Methocarbamol and Sulfa antibiotics  Home Medications   Current Outpatient Rx  Name Route Sig Dispense  Refill  . ACETAMINOPHEN 500 MG PO TABS Oral Take 500 mg by mouth every 6 (six) hours as needed. For pain    . LEVOTHYROXINE SODIUM 75 MCG PO TABS  TAKE 1 TABLET (75 MCG TOTAL) BY MOUTH DAILY. 30 tablet 10  . LORATADINE 10 MG PO TABS Oral Take 10 mg by mouth daily.    Marland Kitchen LOVASTATIN 40 MG PO TABS Oral Take 1 tablet (40 mg total) by mouth at bedtime. 90 tablet 3  . PAROXETINE HCL 20 MG PO TABS Oral Take 1 tablet (20 mg total) by mouth every morning. 90 tablet 3  . TRAMADOL HCL 50 MG PO TABS Oral Take 100 mg by mouth every 6 (six) hours as needed.    Marland Kitchen MOVE FREE PO Oral Take by mouth daily.       Marland Kitchen LEVOTHYROXINE SODIUM 75 MCG PO TABS Oral Take 1 tablet (75 mcg total) by mouth daily. 30 tablet 11  . LUTEIN 20 MG PO CAPS Oral Take by mouth daily.      Marland Kitchen MECLIZINE HCL 25 MG PO TABS Oral Take 1 tablet (25 mg total) by mouth 4 (four) times daily. 28 tablet 0  . OMEPRAZOLE 20 MG PO CPDR Oral Take 1 capsule (20 mg total) by mouth daily. 30 capsule 11  . RANITIDINE HCL 150 MG PO TABS Oral Take 150 mg by mouth daily.        BP 126/80  Pulse 79  Temp 98.1 F (36.7 C) (Oral)  Resp 15  SpO2 98%  Physical Exam  Nursing note and vitals reviewed. Constitutional: She is oriented to person, place, and time. She appears well-developed and well-nourished.  HENT:  Head: Normocephalic and atraumatic.  Right Ear: External ear normal.  Left Ear: External ear normal.  Mouth/Throat: Oropharynx is clear and moist.  Eyes: Conjunctivae normal and EOM are normal. Pupils are equal, round, and reactive to light.  Neck: Normal range of motion. Neck supple.  Cardiovascular: Normal rate, regular rhythm and normal heart sounds.   Pulmonary/Chest: Effort normal and breath sounds normal. No respiratory distress.  Abdominal: Soft. Bowel sounds are normal. There is no tenderness.  Musculoskeletal: Normal range of motion. She exhibits no edema.  Neurological: She is alert and oriented to person, place, and time. No cranial nerve deficit. She exhibits normal muscle tone. Coordination normal.  Skin: Skin is warm. No rash noted.    ED Course  Procedures (including critical care time)  Labs Reviewed  CBC WITH DIFFERENTIAL - Abnormal; Notable for the following:    RBC 5.12 (*)     All other components within normal limits  COMPREHENSIVE METABOLIC PANEL - Abnormal; Notable for the following:    Glucose, Bld 136 (*)     Alkaline Phosphatase 134 (*)     Total Bilirubin 0.2 (*)     All other components within normal limits  POCT I-STAT TROPONIN I   Dg Chest 2 View  11/27/2011  *RADIOLOGY REPORT*  Clinical  Data: Dizziness and shortness of breath.  CHEST - 2 VIEW  Comparison: None  Findings: The cardiac silhouette, mediastinal and hilar contours are normal.  Slightly prominent paratracheal soft tissue density is likely ectatic vasculature or thyroid gland.  No infiltrates, edema or effusions.  There is a button artifact noted over the left upper chest.  The bony thorax is intact.  IMPRESSION: No acute cardiopulmonary findings.   Original Report Authenticated By: P. Loralie Champagne, M.D.    Ct Head Wo Contrast  11/27/2011  *  RADIOLOGY REPORT*  Clinical Data: Dizziness and short of breath  CT HEAD WITHOUT CONTRAST  Technique:  Contiguous axial images were obtained from the base of the skull through the vertex without contrast.  Comparison: MRI 04/27/2009  Findings: Mild to moderate cerebral atrophy.  Negative for hydrocephalus.  Patchy hypodensity in the cerebral white matter bilaterally is unchanged.  No acute infarct, hemorrhage, or mass lesion.  Calvarium intact.  Mild   sinusitis.  IMPRESSION: Atrophy and chronic microvascular ischemic changes in the white matter.  No acute abnormality.   Original Report Authenticated By: Camelia Phenes, M.D.    Results for orders placed during the hospital encounter of 11/27/11  CBC WITH DIFFERENTIAL      Component Value Range   WBC 6.1  4.0 - 10.5 K/uL   RBC 5.12 (*) 3.87 - 5.11 MIL/uL   Hemoglobin 14.3  12.0 - 15.0 g/dL   HCT 40.9  81.1 - 91.4 %   MCV 85.0  78.0 - 100.0 fL   MCH 27.9  26.0 - 34.0 pg   MCHC 32.9  30.0 - 36.0 g/dL   RDW 78.2  95.6 - 21.3 %   Platelets 241  150 - 400 K/uL   Neutrophils Relative 46  43 - 77 %   Neutro Abs 2.8  1.7 - 7.7 K/uL   Lymphocytes Relative 44  12 - 46 %   Lymphs Abs 2.7  0.7 - 4.0 K/uL   Monocytes Relative 8  3 - 12 %   Monocytes Absolute 0.5  0.1 - 1.0 K/uL   Eosinophils Relative 2  0 - 5 %   Eosinophils Absolute 0.1  0.0 - 0.7 K/uL   Basophils Relative 0  0 - 1 %   Basophils Absolute 0.0  0.0 - 0.1 K/uL    COMPREHENSIVE METABOLIC PANEL      Component Value Range   Sodium 138  135 - 145 mEq/L   Potassium 3.5  3.5 - 5.1 mEq/L   Chloride 102  96 - 112 mEq/L   CO2 24  19 - 32 mEq/L   Glucose, Bld 136 (*) 70 - 99 mg/dL   BUN 7  6 - 23 mg/dL   Creatinine, Ser 0.86  0.50 - 1.10 mg/dL   Calcium 9.7  8.4 - 57.8 mg/dL   Total Protein 7.3  6.0 - 8.3 g/dL   Albumin 3.7  3.5 - 5.2 g/dL   AST 17  0 - 37 U/L   ALT 18  0 - 35 U/L   Alkaline Phosphatase 134 (*) 39 - 117 U/L   Total Bilirubin 0.2 (*) 0.3 - 1.2 mg/dL   GFR calc non Af Amer >90  >90 mL/min   GFR calc Af Amer >90  >90 mL/min  POCT I-STAT TROPONIN I      Component Value Range   Troponin i, poc 0.00  0.00 - 0.08 ng/mL   Comment 3             Date: 11/27/2011  Rate: 89  Rhythm: normal sinus rhythm  QRS Axis: normal  Intervals: normal  ST/T Wave abnormalities: nonspecific ST/T changes  Conduction Disutrbances:none  Narrative Interpretation:   Old EKG Reviewed: none available     1. Vertigo       MDM  Patient clinically seems to be having a positional vertigo certainly reproducible by moving her head left to right. CT without significant findings patient also had some left arm pain that's been ongoing for a number  of days the troponin is negative EKG had no acute changes chest x-ray is negative. Patient improved with Antivert here in the emergency department we'll continue that at home and followup with her primary care Dr. No evidence of acute or tightness at this point in time.        Shelda Jakes, MD 11/27/11  Shelda Jakes, MD 11/27/11 2141

## 2011-11-27 NOTE — ED Notes (Signed)
Pt. States that this episode began yesterday.  She has has dizziness, SOB and pain in the middle portion of her chest and toward her left arm; however, she states that she has had rotator cuff issues in her left arm so she is not sure if that left arm pain may be that.  Patient appears stable at this time.;

## 2011-12-05 ENCOUNTER — Telehealth: Payer: Self-pay

## 2011-12-05 MED ORDER — TRAMADOL HCL 50 MG PO TABS: 50.0000 mg | ORAL_TABLET | Freq: Four times a day (QID) | ORAL | Status: DC | PRN

## 2011-12-05 NOTE — Telephone Encounter (Signed)
The patient called requesting her tramadol to be filled as is almost out.  Please send to [facility].

## 2011-12-05 NOTE — Telephone Encounter (Signed)
Done erx 

## 2011-12-05 NOTE — Telephone Encounter (Signed)
Called the pt. Left detailed msg. That rx requested has been sent in.

## 2011-12-08 ENCOUNTER — Ambulatory Visit (INDEPENDENT_AMBULATORY_CARE_PROVIDER_SITE_OTHER): Payer: Medicare Other | Admitting: Internal Medicine

## 2011-12-08 ENCOUNTER — Encounter: Payer: Self-pay | Admitting: Internal Medicine

## 2011-12-08 VITALS — BP 114/80 | HR 94 | Temp 96.6°F | Ht 65.5 in | Wt 202.4 lb

## 2011-12-08 DIAGNOSIS — R221 Localized swelling, mass and lump, neck: Secondary | ICD-10-CM

## 2011-12-08 DIAGNOSIS — Z0000 Encounter for general adult medical examination without abnormal findings: Secondary | ICD-10-CM | POA: Diagnosis not present

## 2011-12-08 DIAGNOSIS — H9319 Tinnitus, unspecified ear: Secondary | ICD-10-CM | POA: Diagnosis not present

## 2011-12-08 DIAGNOSIS — R1011 Right upper quadrant pain: Secondary | ICD-10-CM | POA: Insufficient documentation

## 2011-12-08 DIAGNOSIS — F411 Generalized anxiety disorder: Secondary | ICD-10-CM

## 2011-12-08 DIAGNOSIS — R220 Localized swelling, mass and lump, head: Secondary | ICD-10-CM

## 2011-12-08 DIAGNOSIS — H9190 Unspecified hearing loss, unspecified ear: Secondary | ICD-10-CM | POA: Diagnosis not present

## 2011-12-08 DIAGNOSIS — R42 Dizziness and giddiness: Secondary | ICD-10-CM

## 2011-12-08 DIAGNOSIS — F419 Anxiety disorder, unspecified: Secondary | ICD-10-CM

## 2011-12-08 DIAGNOSIS — H9192 Unspecified hearing loss, left ear: Secondary | ICD-10-CM

## 2011-12-08 MED ORDER — PAROXETINE HCL 20 MG PO TABS: 30.0000 mg | ORAL_TABLET | ORAL | Status: DC

## 2011-12-08 NOTE — Patient Instructions (Addendum)
Continue all other medications as before You will be contacted regarding the referral for: ENT for the left ear hearing, tinnitus, vertigo and the ? Of lump of the right neck You will be contacted regarding the referral for: abdomen ultrasound due to the ongoing pain and weight loss You will be contacted regarding the referral for: colonoscopy OK to increase the Paxil to 30 mg per day No need for further blood work today Please return in 3 months, or sooner if needed

## 2011-12-08 NOTE — Assessment & Plan Note (Addendum)
?   MSK , but with recent wt loss and persistent pain will need abd u/s; also for screening colonoscopy Lab Results  Component Value Date   WBC 6.1 11/27/2011   HGB 14.3 11/27/2011   HCT 43.5 11/27/2011   PLT 241 11/27/2011   GLUCOSE 136* 11/27/2011   CHOL 283* 02/27/2011   TRIG 225.0* 02/27/2011   HDL 52.60 02/27/2011   LDLDIRECT 191.5 02/27/2011   ALT 18 11/27/2011   AST 17 11/27/2011   NA 138 11/27/2011   K 3.5 11/27/2011   CL 102 11/27/2011   CREATININE 0.57 11/27/2011   BUN 7 11/27/2011   CO2 24 11/27/2011   TSH 0.70 08/30/2010

## 2011-12-08 NOTE — Assessment & Plan Note (Signed)
With hearing loss, tinnitus - for ENT referral - ? menieres

## 2011-12-08 NOTE — Assessment & Plan Note (Signed)
Not charged, for colonscopy as she is due, now willing to do

## 2011-12-08 NOTE — Assessment & Plan Note (Signed)
Ok for increased paxil to 30 mg,  to f/u any worsening symptoms or concerns

## 2011-12-08 NOTE — Progress Notes (Signed)
Subjective:    Patient ID: Jenny Acosta, female    DOB: 1946/02/06, 66 y.o.   MRN: [id number]  HPI  Here to f/u, has ongoing depression with low mood, decreased appetite and noted 14 lbs wt loss seeming unintentional since may 2013, and still persistent RUQ pain as per that visit as well, no real change.  Has not yet had her colonoscopy for screening, but now willing to have done.  Also has several months left hearing loss, tinnitus like odd sounds and recurrent vertigo without HA, sinus symtpoms, ST, cough and Pt denies chest pain, increased sob or doe, wheezing, orthopnea, PND, increased LE swelling, palpitations, dizziness or syncope.   Pt denies polydipsia, polyuria.  Pt denies new neurological symptoms such as new headache, or facial or extremity weakness or numbness. Also with c/o lump to right neck near mid jaw line, some improved in size last few days Past Medical History  Diagnosis Date  . Pain, coccyx   . GERD (gastroesophageal reflux disease)   . Hypertension   . Hypothyroidism   . Gallstone   . Carotid stenosis   . Impaired glucose tolerance   . Allergic rhinitis, cause unspecified   . Right lumbar radiculopathy 08/30/2010  . Hyperlipidemia 08/30/2010  . Arthritis    Past Surgical History  Procedure Date  . Cholecystectomy 2002  . Appendectomy 1953  . Tonsillectomy 1959  . Scarlet fever     age 57  . Cholecystectomy open 10 years ago    reports that she has never smoked. She does not have any smokeless tobacco history on file. She reports that she does not drink alcohol or use illicit drugs. family history includes Arthritis in her other; Cancer in her other; Diabetes in her others; Hyperlipidemia in her other; and Stroke in her other. Allergies  Allergen Reactions  . Methocarbamol Other (See Comments)    hallucinations  . Sulfa Antibiotics     unknown   Current Outpatient Prescriptions on File Prior to Visit  Medication Sig Dispense Refill  . acetaminophen (TYLENOL)  500 MG tablet Take 500 mg by mouth every 6 (six) hours as needed. For pain      . levothyroxine (SYNTHROID, LEVOTHROID) 75 MCG tablet TAKE 1 TABLET (75 MCG TOTAL) BY MOUTH DAILY.  30 tablet  10  . loratadine (CLARITIN) 10 MG tablet Take 10 mg by mouth daily.      . meclizine (ANTIVERT) 25 MG tablet Take 1 tablet (25 mg total) by mouth 4 (four) times daily.  28 tablet  0  . traMADol (ULTRAM) 50 MG tablet Take 1 tablet (50 mg total) by mouth every 6 (six) hours as needed.  120 tablet  1  . DISCONTD: PARoxetine (PAXIL) 20 MG tablet Take 1 tablet (20 mg total) by mouth every morning.  90 tablet  3  . Glucosamine-Chondroitin (MOVE FREE PO) Take by mouth daily.        Marland Kitchen levothyroxine (SYNTHROID) 75 MCG tablet Take 1 tablet (75 mcg total) by mouth daily.  30 tablet  11  . lovastatin (MEVACOR) 40 MG tablet Take 1 tablet (40 mg total) by mouth at bedtime.  90 tablet  3  . Lutein 20 MG CAPS Take by mouth daily.        Marland Kitchen omeprazole (PRILOSEC) 20 MG capsule Take 1 capsule (20 mg total) by mouth daily.  30 capsule  11  . ranitidine (ACID REDUCER MAXIMUM STRENGTH) 150 MG tablet Take 150 mg by mouth daily.  Review of Systems  Constitutional: Negative for diaphoresis and unexpected weight change.  Eyes: Negative for photophobia and visual disturbance.  Respiratory: Negative for choking and stridor.   Gastrointestinal: Negative for vomiting and blood in stool.  Genitourinary: Negative for hematuria and decreased urine volume.  Musculoskeletal: Negative for gait problem.  Skin: Negative for color change and wound.  Neurological: Negative for tremors and numbness.  Psychiatric/Behavioral: Negative for decreased concentration. The patient is not hyperactive.       Objective:   Physical Exam BP 114/80  Pulse 94  Temp 96.6 F (35.9 C) (Oral)  Ht 5' 5.5" (1.664 m)  Wt 202 lb 6 oz (91.797 kg)  BMI 33.16 kg/m2  SpO2 95% Physical Exam  VS noted Constitutional: Pt appears well-developed and  well-nourished.  HENT: Head: Normocephalic. With < 1 cm subq nodular mass to right neck near mid jaw line Right Ear: External ear normal.  Left Ear: External ear normal.  Bilat TM's clear, canals clear without red, tender Eyes: Conjunctivae and EOM are normal. Pupils are equal, round, and reactive to light.  Neck: Normal range of motion. Neck supple.  Cardiovascular: Normal rate and regular rhythm.   Pulmonary/Chest: Effort normal and breath sounds normal.  Abd:  Soft, NT, non-distended, + BS except for persistent right costal margin tender without guarding or rebound Neurological: Pt is alert. Not confused  Skin: Skin is warm. No erythema.  Psychiatric: Pt behavior is normal. Thought content normal.     Assessment & Plan:

## 2011-12-08 NOTE — Assessment & Plan Note (Signed)
Right side, small, also for ent opinion

## 2011-12-09 ENCOUNTER — Telehealth: Payer: Self-pay

## 2011-12-09 NOTE — Telephone Encounter (Signed)
Message copied by Pincus Sanes on Tue Dec 09, 2011  3:14 PM ------      Message from: [name]      Created: Mon Dec 08, 2011  5:22 PM       Sorry, this would not be appropriate            I would say though the radiation involved is about what you get with a CXR, not like what you would get with a CT scan;  I would encourage her to call for a yearly mammogram, such as [facility]      ----- Message -----         From: Vladimir Crofts Ewing         Sent: 12/08/2011   4:41 PM           To: [name], MD            This patient stated she has never had a mammogram due to the radiation.  She wondered if while they do the abdominal US could they as well check her Breast??

## 2011-12-09 NOTE — Telephone Encounter (Signed)
Called the patient informed of MD instructions.  She stated she would schedule her mammogram

## 2011-12-12 ENCOUNTER — Other Ambulatory Visit: Payer: Self-pay | Admitting: *Deleted

## 2011-12-12 MED ORDER — MECLIZINE HCL 25 MG PO TABS: 25.0000 mg | ORAL_TABLET | Freq: Four times a day (QID) | ORAL | Status: DC

## 2011-12-12 MED ORDER — TIZANIDINE HCL 2 MG PO CAPS: 2.0000 mg | ORAL_CAPSULE | Freq: Three times a day (TID) | ORAL | Status: DC

## 2011-12-12 NOTE — Telephone Encounter (Signed)
medication refilled and sent to [facility] as patient requested.

## 2011-12-15 ENCOUNTER — Encounter: Payer: Self-pay | Admitting: Gastroenterology

## 2011-12-15 ENCOUNTER — Other Ambulatory Visit: Payer: Self-pay | Admitting: Internal Medicine

## 2011-12-15 DIAGNOSIS — Z1231 Encounter for screening mammogram for malignant neoplasm of breast: Secondary | ICD-10-CM

## 2011-12-16 DIAGNOSIS — H814 Vertigo of central origin: Secondary | ICD-10-CM | POA: Diagnosis not present

## 2011-12-16 DIAGNOSIS — H9190 Unspecified hearing loss, unspecified ear: Secondary | ICD-10-CM | POA: Diagnosis not present

## 2011-12-16 DIAGNOSIS — H9319 Tinnitus, unspecified ear: Secondary | ICD-10-CM | POA: Diagnosis not present

## 2011-12-17 ENCOUNTER — Other Ambulatory Visit: Payer: Medicare Other

## 2012-01-14 ENCOUNTER — Ambulatory Visit
Admission: RE | Admit: 2012-01-14 | Discharge: 2012-01-14 | Disposition: A | Discharge status: Home / Self Care | Payer: Medicare Other | Source: Ambulatory Visit | Attending: Internal Medicine | Admitting: Internal Medicine

## 2012-01-14 DIAGNOSIS — R1011 Right upper quadrant pain: Secondary | ICD-10-CM | POA: Diagnosis not present

## 2012-01-14 DIAGNOSIS — R634 Abnormal weight loss: Secondary | ICD-10-CM | POA: Diagnosis not present

## 2012-01-16 ENCOUNTER — Ambulatory Visit: Payer: Medicare Other

## 2012-01-26 ENCOUNTER — Other Ambulatory Visit: Payer: Self-pay | Admitting: Internal Medicine

## 2012-02-11 ENCOUNTER — Other Ambulatory Visit: Payer: Medicare Other | Admitting: Gastroenterology

## 2012-02-16 ENCOUNTER — Other Ambulatory Visit: Payer: Self-pay | Admitting: Internal Medicine

## 2012-02-16 NOTE — Telephone Encounter (Signed)
Refill tramadol done erx  Robin to let pt know since the rx is early;  Try not to use more than 4 per day

## 2012-02-17 ENCOUNTER — Telehealth: Payer: Self-pay

## 2012-02-17 NOTE — Telephone Encounter (Signed)
Patient called to inform she has been taking tramadol 2 every 6 hours and her rx filled on 01/27/12 lasted only 20 days.  Per MD ok to fill tramadol sent in on 02/16/12 early (informed pharmacist who stated she would have to pay cash as insurance will not pay). Please advise

## 2012-02-17 NOTE — Telephone Encounter (Signed)
Pharmacy informed.

## 2012-02-17 NOTE — Telephone Encounter (Signed)
Ok to fill early  I dont have any other suggestions at this time

## 2012-03-16 ENCOUNTER — Telehealth: Payer: Self-pay

## 2012-03-16 ENCOUNTER — Other Ambulatory Visit: Payer: Self-pay | Admitting: Internal Medicine

## 2012-03-16 NOTE — Telephone Encounter (Signed)
The patient called to inform Tramadol is not helping with her pain. She does need advise as to increase the medication, do an injection or refer to pain clinic?  She is having problems sleeping as well.  Call back number for this patient is [phone]

## 2012-03-16 NOTE — Telephone Encounter (Signed)
Her pain is from her broken tail bone (from many yrs. Ago), severe arthritis in spine and pain radiates to hip and legs.

## 2012-03-16 NOTE — Telephone Encounter (Signed)
Does she mean her right side abd pain, or back pain or other pain?  If abdominal, we should consider refer to GI

## 2012-03-16 NOTE — Telephone Encounter (Signed)
i think would be good to see orthopedic for the back pain - is this OK?

## 2012-03-16 NOTE — Telephone Encounter (Signed)
The patient has seen an orthopedic in the past.  She will call them to schedule appointment as they stated they could do a cortisone injection.

## 2012-03-16 NOTE — Telephone Encounter (Signed)
Ok   I will close note

## 2012-03-17 NOTE — Telephone Encounter (Signed)
Done erx 

## 2012-03-30 ENCOUNTER — Other Ambulatory Visit: Payer: Self-pay | Admitting: Internal Medicine

## 2012-04-05 ENCOUNTER — Telehealth: Payer: Self-pay

## 2012-04-05 DIAGNOSIS — M5416 Radiculopathy, lumbar region: Secondary | ICD-10-CM

## 2012-04-05 NOTE — Telephone Encounter (Signed)
Done per emr 

## 2012-04-05 NOTE — Telephone Encounter (Signed)
The patient would like a referral to Dr. Aliene Beams.  She has transportation only M/W/F please schedule one of those days.

## 2012-04-10 ENCOUNTER — Other Ambulatory Visit: Payer: Self-pay

## 2012-04-13 DIAGNOSIS — M545 Low back pain, unspecified: Secondary | ICD-10-CM | POA: Diagnosis not present

## 2012-04-13 DIAGNOSIS — IMO0002 Reserved for concepts with insufficient information to code with codable children: Secondary | ICD-10-CM | POA: Diagnosis not present

## 2012-04-14 ENCOUNTER — Other Ambulatory Visit: Payer: Self-pay | Admitting: Neurosurgery

## 2012-04-14 DIAGNOSIS — M545 Low back pain, unspecified: Secondary | ICD-10-CM

## 2012-04-15 ENCOUNTER — Other Ambulatory Visit: Payer: Self-pay | Admitting: Internal Medicine

## 2012-04-15 NOTE — Telephone Encounter (Signed)
Done erx 

## 2012-04-20 ENCOUNTER — Other Ambulatory Visit: Payer: Medicare Other

## 2012-05-03 ENCOUNTER — Other Ambulatory Visit: Payer: Self-pay | Admitting: Internal Medicine

## 2012-06-11 ENCOUNTER — Other Ambulatory Visit: Payer: Self-pay | Admitting: Internal Medicine

## 2012-07-21 ENCOUNTER — Other Ambulatory Visit: Payer: Self-pay | Admitting: Internal Medicine

## 2012-07-26 ENCOUNTER — Other Ambulatory Visit: Payer: Self-pay | Admitting: Internal Medicine

## 2012-08-10 ENCOUNTER — Other Ambulatory Visit: Payer: Self-pay | Admitting: Internal Medicine

## 2012-08-24 ENCOUNTER — Other Ambulatory Visit: Payer: Self-pay | Admitting: Internal Medicine

## 2012-09-16 ENCOUNTER — Other Ambulatory Visit: Payer: Self-pay | Admitting: Internal Medicine

## 2012-09-23 ENCOUNTER — Other Ambulatory Visit: Payer: Self-pay | Admitting: Internal Medicine

## 2012-09-24 ENCOUNTER — Other Ambulatory Visit: Payer: Self-pay | Admitting: Internal Medicine

## 2012-09-27 NOTE — Telephone Encounter (Signed)
paxil done erx  Due for yearly visit oct 2014

## 2012-10-01 ENCOUNTER — Other Ambulatory Visit: Payer: Self-pay | Admitting: Internal Medicine

## 2012-10-06 ENCOUNTER — Other Ambulatory Visit: Payer: Self-pay | Admitting: Internal Medicine

## 2012-10-07 NOTE — Telephone Encounter (Signed)
Faxed hardcopy to [facility] [place] [place]

## 2012-10-07 NOTE — Telephone Encounter (Signed)
Done hardcopy to robin  

## 2012-10-11 ENCOUNTER — Other Ambulatory Visit: Payer: Self-pay | Admitting: Internal Medicine

## 2012-10-11 NOTE — Telephone Encounter (Signed)
Faxed hardcopy to [facility] 

## 2012-10-21 ENCOUNTER — Other Ambulatory Visit: Payer: Self-pay | Admitting: Internal Medicine

## 2012-10-22 ENCOUNTER — Other Ambulatory Visit: Payer: Self-pay | Admitting: Internal Medicine

## 2012-11-23 ENCOUNTER — Other Ambulatory Visit: Payer: Self-pay | Admitting: Internal Medicine

## 2012-11-26 ENCOUNTER — Ambulatory Visit: Payer: Medicare Other | Admitting: Internal Medicine

## 2012-12-01 ENCOUNTER — Ambulatory Visit: Payer: Medicare Other | Admitting: Internal Medicine

## 2012-12-08 ENCOUNTER — Other Ambulatory Visit: Payer: Self-pay | Admitting: Internal Medicine

## 2012-12-08 ENCOUNTER — Ambulatory Visit: Payer: Medicare Other | Admitting: Internal Medicine

## 2012-12-08 DIAGNOSIS — Z0289 Encounter for other administrative examinations: Secondary | ICD-10-CM

## 2012-12-08 NOTE — Telephone Encounter (Signed)
Done hardcopy to robin  

## 2012-12-09 NOTE — Telephone Encounter (Signed)
Faxed hardcopy to [facility] [place] 

## 2012-12-17 ENCOUNTER — Ambulatory Visit: Payer: Medicare Other | Admitting: Internal Medicine

## 2012-12-20 ENCOUNTER — Other Ambulatory Visit: Payer: Self-pay | Admitting: Internal Medicine

## 2012-12-21 ENCOUNTER — Other Ambulatory Visit: Payer: Self-pay | Admitting: Internal Medicine

## 2012-12-22 ENCOUNTER — Ambulatory Visit: Payer: Medicare Other | Admitting: Internal Medicine

## 2012-12-22 DIAGNOSIS — Z0289 Encounter for other administrative examinations: Secondary | ICD-10-CM

## 2012-12-30 ENCOUNTER — Other Ambulatory Visit: Payer: Self-pay

## 2013-01-13 DIAGNOSIS — IMO0002 Reserved for concepts with insufficient information to code with codable children: Secondary | ICD-10-CM | POA: Diagnosis not present

## 2013-01-13 DIAGNOSIS — M545 Low back pain, unspecified: Secondary | ICD-10-CM | POA: Diagnosis not present

## 2013-01-18 ENCOUNTER — Telehealth: Payer: Self-pay | Admitting: Internal Medicine

## 2013-01-18 ENCOUNTER — Ambulatory Visit (INDEPENDENT_AMBULATORY_CARE_PROVIDER_SITE_OTHER): Payer: Medicare Other

## 2013-01-18 ENCOUNTER — Encounter: Payer: Self-pay | Admitting: Internal Medicine

## 2013-01-18 ENCOUNTER — Ambulatory Visit (INDEPENDENT_AMBULATORY_CARE_PROVIDER_SITE_OTHER): Payer: Medicare Other | Admitting: Internal Medicine

## 2013-01-18 VITALS — BP 132/80 | HR 87 | Temp 98.1°F | Ht 65.0 in | Wt 193.2 lb

## 2013-01-18 DIAGNOSIS — M25519 Pain in unspecified shoulder: Secondary | ICD-10-CM

## 2013-01-18 DIAGNOSIS — I658 Occlusion and stenosis of other precerebral arteries: Secondary | ICD-10-CM | POA: Diagnosis not present

## 2013-01-18 DIAGNOSIS — R7302 Impaired glucose tolerance (oral): Secondary | ICD-10-CM

## 2013-01-18 DIAGNOSIS — I6529 Occlusion and stenosis of unspecified carotid artery: Secondary | ICD-10-CM

## 2013-01-18 DIAGNOSIS — M2550 Pain in unspecified joint: Secondary | ICD-10-CM

## 2013-01-18 DIAGNOSIS — M79641 Pain in right hand: Secondary | ICD-10-CM

## 2013-01-18 DIAGNOSIS — E785 Hyperlipidemia, unspecified: Secondary | ICD-10-CM

## 2013-01-18 DIAGNOSIS — F411 Generalized anxiety disorder: Secondary | ICD-10-CM

## 2013-01-18 DIAGNOSIS — M79642 Pain in left hand: Secondary | ICD-10-CM | POA: Insufficient documentation

## 2013-01-18 DIAGNOSIS — R7309 Other abnormal glucose: Secondary | ICD-10-CM | POA: Diagnosis not present

## 2013-01-18 DIAGNOSIS — M25511 Pain in right shoulder: Secondary | ICD-10-CM

## 2013-01-18 DIAGNOSIS — M79609 Pain in unspecified limb: Secondary | ICD-10-CM

## 2013-01-18 DIAGNOSIS — I6523 Occlusion and stenosis of bilateral carotid arteries: Secondary | ICD-10-CM

## 2013-01-18 DIAGNOSIS — I10 Essential (primary) hypertension: Secondary | ICD-10-CM

## 2013-01-18 DIAGNOSIS — F419 Anxiety disorder, unspecified: Secondary | ICD-10-CM

## 2013-01-18 DIAGNOSIS — H9190 Unspecified hearing loss, unspecified ear: Secondary | ICD-10-CM

## 2013-01-18 LAB — CBC WITH DIFFERENTIAL/PLATELET
Basophils Absolute: 0 10*3/uL (ref 0.0–0.1)
Basophils Relative: 0.3 % (ref 0.0–3.0)
Eosinophils Absolute: 0 10*3/uL (ref 0.0–0.7)
Eosinophils Relative: 0.2 % (ref 0.0–5.0)
HCT: 42.4 % (ref 36.0–46.0)
Hemoglobin: 14.2 g/dL (ref 12.0–15.0)
Lymphocytes Relative: 34.6 % (ref 12.0–46.0)
Lymphs Abs: 3.9 10*3/uL (ref 0.7–4.0)
MCHC: 33.5 g/dL (ref 30.0–36.0)
MCV: 84.5 fl (ref 78.0–100.0)
Monocytes Absolute: 1 10*3/uL (ref 0.1–1.0)
Monocytes Relative: 8.6 % (ref 3.0–12.0)
Neutro Abs: 6.3 10*3/uL (ref 1.4–7.7)
Neutrophils Relative %: 56.3 % (ref 43.0–77.0)
Platelets: 289 10*3/uL (ref 150.0–400.0)
RBC: 5.02 Mil/uL (ref 3.87–5.11)
RDW: 14.6 % (ref 11.5–14.6)
WBC: 11.3 10*3/uL — ABNORMAL HIGH (ref 4.5–10.5)

## 2013-01-18 LAB — HEMOGLOBIN A1C: Hgb A1c MFr Bld: 5.5 % (ref 4.6–6.5)

## 2013-01-18 MED ORDER — PAROXETINE HCL 30 MG PO TABS: 30.0000 mg | ORAL_TABLET | Freq: Every day | ORAL | Status: DC

## 2013-01-18 MED ORDER — TRAMADOL HCL 50 MG PO TABS: ORAL_TABLET | ORAL | Status: DC

## 2013-01-18 NOTE — Telephone Encounter (Signed)
Referral done per emr 

## 2013-01-18 NOTE — Progress Notes (Signed)
Pre-visit discussion using our clinic review tool. No additional management support is needed unless otherwise documented below in the visit note.  

## 2013-01-18 NOTE — Patient Instructions (Addendum)
Please take all new medication as prescribed - to finish the prednisone OK to increase the paxil to 30 mg per day Please continue all other medications as before, and refills have been done if requested Please have the pharmacy call with any other refills you may need.  Please keep your appointments with your specialists as you may have planned - orthopedic for the lower back., and the MRI as you mentioned for next wk  You will be contacted regarding the referral for: hand surgury, carotid dopplers, Dr Katrinka Blazing in this office for the shoulders, and Rheumatology for "polyarthralgia" to rule out inflammatory arthritis  Please go to the LAB in the Basement (turn left off the elevator) for the tests to be done today, but not the B12 and Vit D, as the computer will not allow this to be done (says has already been done this year)  You will be contacted by phone if any changes need to be made immediately.  Otherwise, you will receive a letter about your results with an explanation, but please check with MyChart first.  Please remember to sign up for My Chart if you have not done so, as this will be important to you in the future with finding out test results, communicating by private email, and scheduling acute appointments online when needed.

## 2013-01-18 NOTE — Assessment & Plan Note (Signed)
stable overall by history and exam, recent data reviewed with pt, and pt to continue medical treatment as before,  to f/u any worsening symptoms or concerns, for f/u labs today Lab Results  Component Value Date   WBC 6.1 11/27/2011   HGB 14.3 11/27/2011   HCT 43.5 11/27/2011   PLT 241 11/27/2011   GLUCOSE 136* 11/27/2011   CHOL 283* 02/27/2011   TRIG 225.0* 02/27/2011   HDL 52.60 02/27/2011   LDLDIRECT 191.5 02/27/2011   ALT 18 11/27/2011   AST 17 11/27/2011   NA 138 11/27/2011   K 3.5 11/27/2011   CL 102 11/27/2011   CREATININE 0.57 11/27/2011   BUN 7 11/27/2011   CO2 24 11/27/2011   TSH 0.70 08/30/2010

## 2013-01-18 NOTE — Telephone Encounter (Signed)
Message copied by [name] on Tue Jan 18, 2013  6:43 PM ------      Message from: Pincus Sanes      Created: Tue Jan 18, 2013  5:26 PM       The patient is due to have another hearing test and was not sure if a referral was necessary.   ------

## 2013-01-18 NOTE — Assessment & Plan Note (Signed)
With palmar tendonitis, for hand surgury referral

## 2013-01-18 NOTE — Progress Notes (Signed)
Subjective:    Patient ID: Jenny Acosta, female    DOB: [birth year], 67 y.o.   MRN: [id number]  HPI  Here to f/u after recently seen per DO physician with mult arthritic complaints including neck, shoulders, hips, knees, hands and feet with some swelling(?) overall improved with predpack.  Denies worsening depressive symptoms, suicidal ideation, or panic; has ongoing anxiety, not increased recently, needs paxil refilled.  Pt continues to have recurring LBP without change in severity, bowel or bladder change, fever, wt loss,  worsening LE pain/numbness/weakness, gait change or falls, has been seeing ortho - has MRI sched for next wk.  Also with specific right hand third finger tendon pain, nodularity.  Also recalls some plaque seen on  Results of carotid u/s on Mychart.  Had mild right shoudler rot cuff issue last yr but now both shoulders worse than before with pain Past Medical History  Diagnosis Date  . Pain, coccyx   . GERD (gastroesophageal reflux disease)   . Hypertension   . Hypothyroidism   . Gallstone   . Carotid stenosis   . Impaired glucose tolerance   . Allergic rhinitis, cause unspecified   . Right lumbar radiculopathy 08/30/2010  . Hyperlipidemia 08/30/2010  . Arthritis    Past Surgical History  Procedure Laterality Date  . Cholecystectomy  2002  . Appendectomy  1953  . Tonsillectomy  1959  . Scarlet fever      age 37  . Cholecystectomy open  10 years ago    reports that she has never smoked. She does not have any smokeless tobacco history on file. She reports that she does not drink alcohol or use illicit drugs. family history includes Arthritis in her other; Cancer in her other; Diabetes in her other and other; Hyperlipidemia in her other; Stroke in her other. Allergies  Allergen Reactions  . Methocarbamol Other (See Comments)    hallucinations  . Sulfa Antibiotics     unknown   Current Outpatient Prescriptions on File Prior to Visit  Medication Sig Dispense Refill   . acetaminophen (TYLENOL) 500 MG tablet Take 500 mg by mouth every 6 (six) hours as needed. For pain      . levothyroxine (SYNTHROID, LEVOTHROID) 75 MCG tablet TAKE 1 TABLET (75 MCG TOTAL) BY MOUTH DAILY.  30 tablet  10  . loratadine (CLARITIN) 10 MG tablet Take 10 mg by mouth daily.      . meclizine (ANTIVERT) 25 MG tablet TAKE 1 TABLET BY MOUTH 4 TIMES DAILY.  120 tablet  0  . tiZANidine (ZANAFLEX) 2 MG tablet TAKE 1 TABLET BY MOUTH 3 TIMES A DAY  90 tablet  0  . levothyroxine (SYNTHROID) 75 MCG tablet Take 1 tablet (75 mcg total) by mouth daily.  30 tablet  11  . lovastatin (MEVACOR) 40 MG tablet Take 1 tablet (40 mg total) by mouth at bedtime.  90 tablet  3  . omeprazole (PRILOSEC) 20 MG capsule Take 1 capsule (20 mg total) by mouth daily.  30 capsule  11   No current facility-administered medications on file prior to visit.    Review of Systems  Constitutional: Negative for unexpected weight change, or unusual diaphoresis  HENT: Negative for tinnitus.   Eyes: Negative for photophobia and visual disturbance.  Respiratory: Negative for choking and stridor.   Gastrointestinal: Negative for vomiting and blood in stool.  Genitourinary: Negative for hematuria and decreased urine volume.  Musculoskeletal: Negative for acute joint swelling Skin: Negative for color change and wound.  Neurological: Negative for tremors and numbness other than noted  Psychiatric/Behavioral: Negative for decreased concentration or  hyperactivity.       Objective:   Physical Exam BP 132/80  Pulse 87  Temp(Src) 98.1 F (36.7 C) (Oral)  Ht 5\' 5"  (1.651 m)  Wt 193 lb 4 oz (87.658 kg)  BMI 32.16 kg/m2  SpO2 96% VS noted, non toxic Constitutional: Pt appears well-developed and well-nourished.  HENT: Head: NCAT.  Right Ear: External ear normal.  Left Ear: External ear normal.  Eyes: Conjunctivae and EOM are normal. Pupils are equal, round, and reactive to light.  Neck: Normal range of motion. Neck  supple.  Cardiovascular: Normal rate and regular rhythm.   Pulmonary/Chest: Effort normal and breath sounds normal.  Abd:  Soft, NT, non-distended, + BS No acute synovitis Right palmar tendinosis noted, tender Bilat shoudlers with reduced ROM, tender No carotid bruit noted bilat Neurological: Pt is alert. Not confused  Skin: Skin is warm. No erythema.  Psychiatric: Pt behavior is normal. Thought content normal.     Assessment & Plan:

## 2013-01-18 NOTE — Assessment & Plan Note (Signed)
stable overall by history and exam, recent data reviewed with pt, and pt to continue medical treatment as before,  to f/u any worsening symptoms or concerns Lab Results  Component Value Date   CHOL 283* 02/27/2011   HDL 52.60 02/27/2011   LDLDIRECT 191.5 02/27/2011   TRIG 225.0* 02/27/2011   CHOLHDL 5 02/27/2011   For f/u lab, goal ldl < 100

## 2013-01-18 NOTE — Assessment & Plan Note (Signed)
stable overall by history and exam, recent data reviewed with pt, and pt to continue medical treatment as before,  to f/u any worsening symptoms or concerns BP Readings from Last 3 Encounters:  01/18/13 132/80  12/08/11 114/80  11/27/11 133/43

## 2013-01-18 NOTE — Assessment & Plan Note (Signed)
Asympt, cont asa, for f/u carotid dopplers

## 2013-01-18 NOTE — Assessment & Plan Note (Signed)
Ok for paxil refill at 30 mg per day, has done with this recently

## 2013-01-18 NOTE — Assessment & Plan Note (Addendum)
?   Inflammatory arthrisi, for labs, finish predpack, refer rheumatology  Note:  Total time for pt hx, exam, review of record with pt in the room, determination of diagnoses and plan for further eval and tx is > 40 min, with over 50% spent in coordination and counseling of patient

## 2013-01-18 NOTE — Assessment & Plan Note (Signed)
prob recurrent rot cuff dz - for sport med referral

## 2013-01-19 ENCOUNTER — Other Ambulatory Visit: Payer: Self-pay | Admitting: Internal Medicine

## 2013-01-19 LAB — BASIC METABOLIC PANEL
BUN: 13 mg/dL (ref 6–23)
CO2: 25 mEq/L (ref 19–32)
Calcium: 9.1 mg/dL (ref 8.4–10.5)
Chloride: 105 mEq/L (ref 96–112)
Creatinine, Ser: 0.6 mg/dL (ref 0.4–1.2)
GFR: 98.13 mL/min (ref >60.00)
Glucose, Bld: 95 mg/dL (ref 70–99)
Potassium: 3.5 mEq/L (ref 3.5–5.1)
Sodium: 139 mEq/L (ref 135–145)

## 2013-01-19 LAB — URINALYSIS, ROUTINE W REFLEX MICROSCOPIC
Bilirubin Urine: NEGATIVE
Hgb urine dipstick: NEGATIVE
Leukocytes, UA: NEGATIVE
Nitrite: NEGATIVE
Specific Gravity, Urine: >=1.03 (ref 1.000–1.030)
Total Protein, Urine: NEGATIVE
Urine Glucose: NEGATIVE
Urobilinogen, UA: 0.2 (ref 0.0–1.0)
pH: 6 (ref 5.0–8.0)

## 2013-01-19 LAB — LIPID PANEL
Cholesterol: 306 mg/dL — ABNORMAL HIGH (ref 0–200)
HDL: 64.3 mg/dL (ref >39.00)
Total CHOL/HDL Ratio: 5
Triglycerides: 106 mg/dL (ref 0.0–149.0)
VLDL: 21.2 mg/dL (ref 0.0–40.0)

## 2013-01-19 LAB — RHEUMATOID FACTOR: Rhuematoid fact SerPl-aCnc: <10 IU/mL (ref <=14)

## 2013-01-19 LAB — HEPATIC FUNCTION PANEL
ALT: 14 U/L (ref 0–35)
AST: 15 U/L (ref 0–37)
Albumin: 4 g/dL (ref 3.5–5.2)
Alkaline Phosphatase: 86 U/L (ref 39–117)
Bilirubin, Direct: 0 mg/dL (ref 0.0–0.3)
Total Bilirubin: 0.5 mg/dL (ref 0.3–1.2)
Total Protein: 7.5 g/dL (ref 6.0–8.3)

## 2013-01-19 LAB — TSH: TSH: 1.5 u[IU]/mL (ref 0.35–5.50)

## 2013-01-19 LAB — SEDIMENTATION RATE: Sed Rate: 15 mm/hr (ref 0–22)

## 2013-01-19 LAB — LDL CHOLESTEROL, DIRECT: Direct LDL: 231.6 mg/dL

## 2013-01-19 MED ORDER — ATORVASTATIN CALCIUM 20 MG PO TABS: 20.0000 mg | ORAL_TABLET | Freq: Every day | ORAL | Status: DC

## 2013-01-19 NOTE — Telephone Encounter (Signed)
Called left message that referral is done.

## 2013-01-21 LAB — ANA: Anti Nuclear Antibody(ANA): NEGATIVE

## 2013-02-01 ENCOUNTER — Other Ambulatory Visit: Payer: Self-pay | Admitting: Internal Medicine

## 2013-02-01 ENCOUNTER — Ambulatory Visit: Payer: Medicare Other | Admitting: Family Medicine

## 2013-02-03 ENCOUNTER — Telehealth: Payer: Self-pay | Admitting: *Deleted

## 2013-02-03 NOTE — Telephone Encounter (Signed)
Patient informed of MD instructions.  The patient would like to know if she could have anything else for pain during the times the tramadol does not work.

## 2013-02-03 NOTE — Telephone Encounter (Signed)
Sorry, I no longer prescribe the hydrocodone or oxycodone meds due to recent change in [place] law and [facility] board regulations, she can take otc alleve with the tramadol if needed

## 2013-02-03 NOTE — Telephone Encounter (Signed)
Pt called requesting whether Dr Jonny Ruiz has made a Dx from the tests.  Please advise

## 2013-02-03 NOTE — Telephone Encounter (Signed)
Not specifically, although I still hope she sees Rheumatology as referred.

## 2013-02-03 NOTE — Telephone Encounter (Signed)
Called the patient left a detailed message on phone number on phone note as well as number listed in chart.

## 2013-02-04 ENCOUNTER — Ambulatory Visit: Payer: Medicare Other | Admitting: Family Medicine

## 2013-02-09 ENCOUNTER — Encounter: Payer: Self-pay | Admitting: Family Medicine

## 2013-02-09 ENCOUNTER — Ambulatory Visit (INDEPENDENT_AMBULATORY_CARE_PROVIDER_SITE_OTHER): Payer: Medicare Other | Admitting: Family Medicine

## 2013-02-09 ENCOUNTER — Other Ambulatory Visit (INDEPENDENT_AMBULATORY_CARE_PROVIDER_SITE_OTHER): Payer: Medicare Other

## 2013-02-09 ENCOUNTER — Telehealth: Payer: Self-pay | Admitting: Internal Medicine

## 2013-02-09 VITALS — BP 134/82 | HR 103 | Wt 195.0 lb

## 2013-02-09 DIAGNOSIS — M7550 Bursitis of unspecified shoulder: Secondary | ICD-10-CM

## 2013-02-09 DIAGNOSIS — M25512 Pain in left shoulder: Secondary | ICD-10-CM

## 2013-02-09 DIAGNOSIS — M75100 Unspecified rotator cuff tear or rupture of unspecified shoulder, not specified as traumatic: Secondary | ICD-10-CM | POA: Diagnosis not present

## 2013-02-09 DIAGNOSIS — M25519 Pain in unspecified shoulder: Secondary | ICD-10-CM | POA: Diagnosis not present

## 2013-02-09 DIAGNOSIS — M5416 Radiculopathy, lumbar region: Secondary | ICD-10-CM

## 2013-02-09 DIAGNOSIS — IMO0002 Reserved for concepts with insufficient information to code with codable children: Secondary | ICD-10-CM

## 2013-02-09 DIAGNOSIS — M25511 Pain in right shoulder: Secondary | ICD-10-CM

## 2013-02-09 NOTE — Assessment & Plan Note (Signed)
Patient did give history of back pain. Patient was given home exercise program try first. This was not fully evaluated today.

## 2013-02-09 NOTE — Assessment & Plan Note (Signed)
Patient had injections as described above. Patient tolerated the procedures well. Patient and home exercise program to try to do a regular basis. Patient also given different over-the-counter medications that can be beneficial. We did discuss icing protocol. If patient continues to have pain over to get x-rays of the shoulder as well as neck at followup. Patient does have some mild atrophy of the infraspinatus bilaterally but does not have any weakness. That does make one concern for a quadrilateral space first herniated disc. I think this is unlikely but we'll evaluate if necessary.

## 2013-02-09 NOTE — Progress Notes (Signed)
Pre-visit discussion using our clinic review tool. No additional management support is needed unless otherwise documented below in the visit note.  

## 2013-02-09 NOTE — Telephone Encounter (Signed)
Recd records from [facility].,Forwarding 3pgs to Dr.John

## 2013-02-09 NOTE — Progress Notes (Signed)
I'm seeing this patient by the request  of:  Oliver Barre, MD  CC: Bilateral shoulder pain  HPI: Patient is a pleasant 67 year old female coming in with bilateral shoulder pain. Patient states that she's had intermittent left shoulder pain for multiple years. Patient states this time and seems to be lasting longer in because she is compensating her right arm is starting to hurt. Patient describes the pain in both shoulders is a dull aching sensation with mild radiation down the arm. Patient denies any neck pain no. Patient states that she does not have weakness but feels weak when she is doing repetitive motions. Denies any numbness. Patient states that she can have pain at night as well. He does respond to some anti-inflammatories. Patient rates the pain approximately 7/10.  Past medical, surgical, family and social history reviewed. Medications reviewed all in the electronic medical record.   Review of Systems: No headache, visual changes, nausea, vomiting, diarrhea, constipation, dizziness, abdominal pain, skin rash, fevers, chills, night sweats, weight loss, swollen lymph nodes, body aches, joint swelling, muscle aches, chest pain, shortness of breath, mood changes.   Objective:    Blood pressure 134/82, pulse 103, weight 195 lb (88.451 kg), SpO2 97.00%.   General: No apparent distress alert and oriented x3 mood and affect normal, dressed appropriately.  HEENT: Pupils equal, extraocular movements intact Respiratory: Patient's speak in full sentences and does not appear short of breath Cardiovascular: No lower extremity edema, non tender, no erythema Skin: Warm dry intact with no signs of infection or rash on extremities or on axial skeleton. Abdomen: Soft nontender Neuro: Cranial nerves II through XII are intact, neurovascularly intact in all extremities with 2+ DTRs and 2+ pulses. Lymph: No lymphadenopathy of posterior or anterior cervical chain or axillae bilaterally.  Gait normal with  good balance and coordination.  MSK: Non tender with full range of motion and good stability and symmetric strength and tone of elbows, wrist, hip, knee and ankles bilaterally.  Shoulder: Bilateral Inspection reveals no abnormalities, patient does have mild atrophy of the infraspinatus bilaterally Palpation is normal with no tenderness over AC joint or bicipital groove. ROM is full in all planes. Rotator cuff strength normal throughout. Positive signs of impingement with negative Neer and Hawkin's tests, negative empty can sign. Speeds and Yergason's tests normal. No labral pathology noted with negative Obrien's, negative clunk and good stability. Normal scapular function observed. No painful arc and no drop arm sign. No apprehension sign  MSK US performed of: Bilateral shoulder This study was ordered, performed, and interpreted by Terrilee Files D.O.  Shoulder:   Supraspinatus:  Appears normal on long and transverse views, no bursal bulge seen with shoulder abduction on impingement view. Patient does have a subacromial bursitis Infraspinatus:  Appears normal on long and transverse views. Subscapularis:  Appears normal on long and transverse views. Teres Minor:  Appears normal on long and transverse views. AC joint:  Capsule undistended, no geyser sign. Mild osteoarthritic changes Glenohumeral Joint:  Appears normal without effusion. Glenoid Labrum:  Intact without visualized tears. Biceps Tendon:  Appears normal on long and transverse views, no fraying of tendon, tendon located in intertubercular groove, no subluxation with shoulder internal or external rotation. No increased power doppler signal.  Impression: Subacromial bursitis bilaterally  Procedure: Real-time Ultrasound Guided Injection of right glenohumeral joint Device: GE Logiq E  Ultrasound guided injection is preferred based studies that show increased duration, increased effect, greater accuracy, decreased procedural pain,  increased response rate with ultrasound guided  versus blind injection.  Verbal informed consent obtained.  Time-out conducted.  Noted no overlying erythema, induration, or other signs of local infection.  Skin prepped in a sterile fashion.  Local anesthesia: Topical Ethyl chloride.  With sterile technique and under real time ultrasound guidance:  Joint visualized.  23g 1  inch needle inserted posterior approach. Pictures taken for needle placement. Patient did have injection of 2 cc of 1% lidocaine, 2 cc of 0.5% Marcaine, and 1.0 cc of Kenalog 40 mg/dL. Completed without difficulty  Pain immediately resolved suggesting accurate placement of the medication.  Advised to call if fevers/chills, erythema, induration, drainage, or persistent bleeding.  Images permanently stored and available for review in the ultrasound unit.  Impression: Technically successful ultrasound guided injection.  Procedure: Real-time Ultrasound Guided Injection of left glenohumeral joint Device: GE Logiq E  Ultrasound guided injection is preferred based studies that show increased duration, increased effect, greater accuracy, decreased procedural pain, increased response rate with ultrasound guided versus blind injection.  Verbal informed consent obtained.  Time-out conducted.  Noted no overlying erythema, induration, or other signs of local infection.  Skin prepped in a sterile fashion.  Local anesthesia: Topical Ethyl chloride.  With sterile technique and under real time ultrasound guidance:  Joint visualized.  23g 1  inch needle inserted posterior approach. Pictures taken for needle placement. Patient did have injection of 2 cc of 1% lidocaine, 2 cc of 0.5% Marcaine, and 1cc of Kenalog 40 mg/dL. Completed without difficulty  Pain immediately resolved suggesting accurate placement of the medication.  Advised to call if fevers/chills, erythema, induration, drainage, or persistent bleeding.  Images permanently stored  and available for review in the ultrasound unit.  Impression: Technically successful ultrasound guided injection.    Impression and Recommendations:     This case required medical decision making of moderate complexity.

## 2013-02-09 NOTE — Patient Instructions (Signed)
Very nice to meet you Try the exercises I am giving you for your back and shoulder most days of the week.  Posture exercise.  Stand on wall 5 minutes a day.  Ice 20 minutes 2 times daily can help Take tylenol 650 mg three times a day is the best evidence based medicine we have for arthritis.  Glucosamine sulfate 750mg  twice a day is a supplement that has been shown to help moderate to severe arthritis. Vitamin D 1000 IU daily Tumeric 500mg  twice daily.  Capsaicin topically up to four times a day may also help with pain. Come back in 4 weeks.

## 2013-02-21 ENCOUNTER — Telehealth: Payer: Self-pay | Admitting: *Deleted

## 2013-02-21 DIAGNOSIS — M25519 Pain in unspecified shoulder: Secondary | ICD-10-CM

## 2013-02-21 NOTE — Telephone Encounter (Signed)
Done per emr 

## 2013-02-21 NOTE — Telephone Encounter (Signed)
Pt called requesting a referral to Dr Lurena Nida.  Pt has appointment scheduled for 1.12.15 @ 9am however she needs a new referral.  The current referral is for a PA-C not the MD.  Please advise

## 2013-03-01 ENCOUNTER — Other Ambulatory Visit: Payer: Self-pay | Admitting: Internal Medicine

## 2013-03-07 DIAGNOSIS — M479 Spondylosis, unspecified: Secondary | ICD-10-CM | POA: Diagnosis not present

## 2013-03-07 DIAGNOSIS — IMO0001 Reserved for inherently not codable concepts without codable children: Secondary | ICD-10-CM | POA: Diagnosis not present

## 2013-03-09 ENCOUNTER — Ambulatory Visit: Payer: Medicare Other | Admitting: Family Medicine

## 2013-03-15 ENCOUNTER — Ambulatory Visit (INDEPENDENT_AMBULATORY_CARE_PROVIDER_SITE_OTHER): Payer: Medicare Other | Admitting: Family Medicine

## 2013-03-15 ENCOUNTER — Encounter: Payer: Self-pay | Admitting: Family Medicine

## 2013-03-15 VITALS — BP 136/70 | HR 108 | Temp 97.0°F | Resp 16 | Wt 191.1 lb

## 2013-03-15 DIAGNOSIS — M7550 Bursitis of unspecified shoulder: Secondary | ICD-10-CM

## 2013-03-15 DIAGNOSIS — IMO0002 Reserved for concepts with insufficient information to code with codable children: Secondary | ICD-10-CM | POA: Diagnosis not present

## 2013-03-15 DIAGNOSIS — M75100 Unspecified rotator cuff tear or rupture of unspecified shoulder, not specified as traumatic: Secondary | ICD-10-CM | POA: Diagnosis not present

## 2013-03-15 DIAGNOSIS — M533 Sacrococcygeal disorders, not elsewhere classified: Secondary | ICD-10-CM | POA: Diagnosis not present

## 2013-03-15 NOTE — Progress Notes (Signed)
Pre-visit discussion using our clinic review tool. No additional management support is needed unless otherwise documented below in the visit note.  

## 2013-03-15 NOTE — Patient Instructions (Signed)
It is wonderful to see you doing better  Try exercises most days of the week.  Sacroiliac Joint Mobilization and Rehab 1. Work on pretzel stretching, shoulder back and leg draped in front. 3-5 sets, 30 sec.. 2. hip abductor rotations. standing, hip flexion and rotation outward then inward. 3 sets, 15 reps. when can do comfortably, add ankle weights starting at 2 pounds.  3. cross over stretching - shoulder back to ground, same side leg crossover. 3-5 sets for 30 min..  4. rolling up and back knees to chest and rocking. 5. sacral tilt - 5 sets, hold for 5-10 seconds Look at exercises for your knee as well Come back again in 3 weeks.

## 2013-03-15 NOTE — Assessment & Plan Note (Signed)
Patient did have injection as described above. Patient tolerated the procedure well and was given home exercise program to try. The discussed course strength. Patient will followup again in 3 weeks for further evaluation.

## 2013-03-15 NOTE — Progress Notes (Signed)
CC: Bilateral shoulder pain follow up.   HPI: Patient is a pleasant 68 year old female coming in with bilateral shoulder pain. As exam did have some bilateral subacromial bursitis and did have ultrasound-guided injections. Patient was given home exercise program and states she is 100% better. Patient is having no pain with her shoulders and states that her activities of daily living and quality of life has improved tremendously.  Patient like to discuss more about pain of a problem. Patient is having more lower back pain and left-sided. She's had this pain for quite some time and has been diagnosed with fibromyalgia and a bad back. Patient has had an MRI back in 2012 was found to have severe bilateral facet arthritis at L4-5 and minimal degenerative disc disease at L5-S1.  There was a concern for potential surgical neck disease.  Past medical, surgical, family and social history reviewed. Medications reviewed all in the electronic medical record.   Review of Systems: No headache, visual changes, nausea, vomiting, diarrhea, constipation, dizziness, abdominal pain, skin rash, fevers, chills, night sweats, weight loss, swollen lymph nodes, body aches, joint swelling, muscle aches, chest pain, shortness of breath, mood changes.   Objective:    Blood pressure 136/70, pulse 108, temperature 97 F (36.1 C), temperature source Oral, resp. rate 16, weight 191 lb 1.9 oz (86.691 kg), SpO2 97.00%.   General: No apparent distress alert and oriented x3 mood and affect normal, dressed appropriately.  HEENT: Pupils equal, extraocular movements intact Respiratory: Patient's speak in full sentences and does not appear short of breath Cardiovascular: No lower extremity edema, non tender, no erythema Skin: Warm dry intact with no signs of infection or rash on extremities or on axial skeleton. Abdomen: Soft nontender Neuro: Cranial nerves II through XII are intact, neurovascularly intact in all extremities with  2+ DTRs and 2+ pulses. Lymph: No lymphadenopathy of posterior or anterior cervical chain or axillae bilaterally.  Gait normal with good balance and coordination.  MSK: Non tender with full range of motion and good stability and symmetric strength and tone of elbows, wrist, hip, knee and ankles bilaterally.  Shoulder: Bilateral Inspection reveals no abnormalities, patient does have mild atrophy of the infraspinatus bilaterally Palpation is normal with no tenderness over AC joint or bicipital groove. ROM is full in all planes. Rotator cuff strength normal throughout. Negative signs of impingement Speeds and Yergason's tests normal. No labral pathology noted with negative Obrien's, negative clunk and good stability. Normal scapular function observed. No painful arc and no drop arm sign. No apprehension sign  Back Exam:  Inspection: Unremarkable poor course strength Motion: Flexion 35 deg, Extension 45 deg, Side Bending to 45 deg bilaterally,  Rotation to 45 deg bilaterally  SLR laying: Negative  XSLR laying: Negative  Palpable tenderness: Tenderness over the left SI joint FABER: Positive on left. Sensory change: Gross sensation intact to all lumbar and sacral dermatomes.  Reflexes: 2+ at both patellar tendons, 2+ at achilles tendons, Babinski's downgoing.  Strength at foot  Plantar-flexion: 5/5 Dorsi-flexion: 5/5 Eversion: 5/5 Inversion: 5/5  Leg strength  Quad: 5/5 Hamstring: 5/5 Hip flexor: 5/5 Hip abductors: 5/5  Gait unremarkable.  After verbal consent patient was prepped with alcohol pads and then with a 27-gauge 1-1/2 inch needle was injected with 1 cc of 0.5% Marcaine and 1 cc of 40 mg/dL Kenalog without any bleeding. Patient did have improvement immediately. Post injection instructions given.   Impression and Recommendations:     This case required medical decision making of moderate  complexity.

## 2013-03-15 NOTE — Assessment & Plan Note (Signed)
Patient did have extreme benefit from the injections previously. Patient will continue the home exercises 3 times a week. Patient will come back on an as-needed basis for this problem.

## 2013-04-06 ENCOUNTER — Ambulatory Visit: Payer: Medicare Other | Admitting: Family Medicine

## 2013-04-12 ENCOUNTER — Ambulatory Visit: Payer: Medicare Other | Admitting: Family Medicine

## 2013-04-16 ENCOUNTER — Other Ambulatory Visit: Payer: Self-pay | Admitting: Internal Medicine

## 2013-04-18 NOTE — Telephone Encounter (Signed)
Patient phoned requesting refill on tramadol.  Last OV with PCP 03/15/13 and last ordered 01/18/13.  Please advise.  CB# [phone]

## 2013-04-19 NOTE — Telephone Encounter (Signed)
Faxed hardcopy to [facility] [address] [place]

## 2013-04-19 NOTE — Telephone Encounter (Signed)
Done hardcopy to robin  

## 2013-04-24 ENCOUNTER — Other Ambulatory Visit: Payer: Self-pay | Admitting: Internal Medicine

## 2013-06-09 ENCOUNTER — Other Ambulatory Visit: Payer: Self-pay | Admitting: Internal Medicine

## 2013-06-28 ENCOUNTER — Telehealth: Payer: Self-pay | Admitting: Internal Medicine

## 2013-06-28 NOTE — Telephone Encounter (Signed)
Pt has had a migraine for nine days.  She wants something called in for it.  No appts with Dr. Jonny RuizJohn until Friday.  What could she do.  She made an appt for Friday.

## 2013-06-28 NOTE — Telephone Encounter (Signed)
I would go to UC to assess for any other cause of pain now. I cant really prescribe without evaluation

## 2013-06-28 NOTE — Telephone Encounter (Signed)
Patient informed and agreed to do so.

## 2013-06-29 ENCOUNTER — Other Ambulatory Visit: Payer: Self-pay | Admitting: Internal Medicine

## 2013-07-01 ENCOUNTER — Ambulatory Visit: Payer: Medicare Other | Admitting: Internal Medicine

## 2013-07-06 ENCOUNTER — Other Ambulatory Visit: Payer: Self-pay | Admitting: Internal Medicine

## 2013-07-07 ENCOUNTER — Ambulatory Visit: Payer: Medicare Other | Admitting: Internal Medicine

## 2013-07-07 ENCOUNTER — Other Ambulatory Visit: Payer: Self-pay | Admitting: Internal Medicine

## 2013-07-07 DIAGNOSIS — Z0289 Encounter for other administrative examinations: Secondary | ICD-10-CM

## 2013-07-21 ENCOUNTER — Ambulatory Visit (INDEPENDENT_AMBULATORY_CARE_PROVIDER_SITE_OTHER): Payer: Medicare Other | Admitting: Internal Medicine

## 2013-07-21 ENCOUNTER — Other Ambulatory Visit (INDEPENDENT_AMBULATORY_CARE_PROVIDER_SITE_OTHER): Payer: Medicare Other

## 2013-07-21 ENCOUNTER — Encounter: Payer: Self-pay | Admitting: Internal Medicine

## 2013-07-21 VITALS — BP 134/90 | HR 108 | Temp 97.9°F | Ht 64.0 in | Wt 192.0 lb

## 2013-07-21 DIAGNOSIS — M797 Fibromyalgia: Secondary | ICD-10-CM

## 2013-07-21 DIAGNOSIS — Z23 Encounter for immunization: Secondary | ICD-10-CM | POA: Diagnosis not present

## 2013-07-21 DIAGNOSIS — R7302 Impaired glucose tolerance (oral): Secondary | ICD-10-CM

## 2013-07-21 DIAGNOSIS — E785 Hyperlipidemia, unspecified: Secondary | ICD-10-CM | POA: Diagnosis not present

## 2013-07-21 DIAGNOSIS — Z79899 Other long term (current) drug therapy: Secondary | ICD-10-CM | POA: Diagnosis not present

## 2013-07-21 DIAGNOSIS — R7309 Other abnormal glucose: Secondary | ICD-10-CM

## 2013-07-21 DIAGNOSIS — R51 Headache: Secondary | ICD-10-CM | POA: Diagnosis not present

## 2013-07-21 DIAGNOSIS — Z800 Family history of malignant neoplasm of digestive organs: Secondary | ICD-10-CM | POA: Insufficient documentation

## 2013-07-21 DIAGNOSIS — J019 Acute sinusitis, unspecified: Secondary | ICD-10-CM

## 2013-07-21 DIAGNOSIS — IMO0001 Reserved for inherently not codable concepts without codable children: Secondary | ICD-10-CM

## 2013-07-21 DIAGNOSIS — I6529 Occlusion and stenosis of unspecified carotid artery: Secondary | ICD-10-CM | POA: Diagnosis not present

## 2013-07-21 DIAGNOSIS — R519 Headache, unspecified: Secondary | ICD-10-CM | POA: Insufficient documentation

## 2013-07-21 HISTORY — DX: Family history of malignant neoplasm of digestive organs: Z80.0

## 2013-07-21 LAB — BASIC METABOLIC PANEL
BUN: 12 mg/dL (ref 6–23)
CO2: 25 mEq/L (ref 19–32)
Calcium: 9.4 mg/dL (ref 8.4–10.5)
Chloride: 104 mEq/L (ref 96–112)
Creatinine, Ser: 0.8 mg/dL (ref 0.4–1.2)
GFR: 80.36 mL/min (ref >60.00)
Glucose, Bld: 104 mg/dL — ABNORMAL HIGH (ref 70–99)
Potassium: 3.6 mEq/L (ref 3.5–5.1)
Sodium: 140 mEq/L (ref 135–145)

## 2013-07-21 LAB — SEDIMENTATION RATE: Sed Rate: 25 mm/hr — ABNORMAL HIGH (ref 0–22)

## 2013-07-21 LAB — CBC WITH DIFFERENTIAL/PLATELET
Basophils Absolute: 0 10*3/uL (ref 0.0–0.1)
Basophils Relative: 0.2 % (ref 0.0–3.0)
Eosinophils Absolute: 0.1 10*3/uL (ref 0.0–0.7)
Eosinophils Relative: 1 % (ref 0.0–5.0)
HCT: 43.5 % (ref 36.0–46.0)
Hemoglobin: 14.8 g/dL (ref 12.0–15.0)
Lymphocytes Relative: 44 % (ref 12.0–46.0)
Lymphs Abs: 5.7 10*3/uL — ABNORMAL HIGH (ref 0.7–4.0)
MCHC: 33.9 g/dL (ref 30.0–36.0)
MCV: 85.6 fl (ref 78.0–100.0)
Monocytes Absolute: 0.9 10*3/uL (ref 0.1–1.0)
Monocytes Relative: 6.8 % (ref 3.0–12.0)
Neutro Abs: 6.2 10*3/uL (ref 1.4–7.7)
Neutrophils Relative %: 48 % (ref 43.0–77.0)
Platelets: 312 10*3/uL (ref 150.0–400.0)
RBC: 5.09 Mil/uL (ref 3.87–5.11)
RDW: 13.7 % (ref 11.5–15.5)
WBC: 12.9 10*3/uL — ABNORMAL HIGH (ref 4.0–10.5)

## 2013-07-21 LAB — LIPID PANEL
Cholesterol: 299 mg/dL — ABNORMAL HIGH (ref 0–200)
HDL: 52.8 mg/dL (ref >39.00)
LDL Cholesterol: 193 mg/dL — ABNORMAL HIGH (ref 0–99)
Total CHOL/HDL Ratio: 6
Triglycerides: 267 mg/dL — ABNORMAL HIGH (ref 0.0–149.0)
VLDL: 53.4 mg/dL — ABNORMAL HIGH (ref 0.0–40.0)

## 2013-07-21 LAB — HEPATIC FUNCTION PANEL
ALT: 15 U/L (ref 0–35)
AST: 20 U/L (ref 0–37)
Albumin: 4.1 g/dL (ref 3.5–5.2)
Alkaline Phosphatase: 89 U/L (ref 39–117)
Bilirubin, Direct: 0.1 mg/dL (ref 0.0–0.3)
Total Bilirubin: 0.5 mg/dL (ref 0.2–1.2)
Total Protein: 7.4 g/dL (ref 6.0–8.3)

## 2013-07-21 LAB — TSH: TSH: 2.18 u[IU]/mL (ref 0.35–4.50)

## 2013-07-21 LAB — HEMOGLOBIN A1C: Hgb A1c MFr Bld: 5.4 % (ref 4.6–6.5)

## 2013-07-21 MED ORDER — PREDNISONE 10 MG PO TABS: 10.0000 mg | ORAL_TABLET | Freq: Every day | ORAL | Status: DC

## 2013-07-21 MED ORDER — METHYLPREDNISOLONE ACETATE 80 MG/ML IJ SUSP
80.0000 mg | Freq: Once | INTRAMUSCULAR | Status: AC
  Administered 2013-07-21: 80 mg via INTRAMUSCULAR

## 2013-07-21 MED ORDER — PREDNISONE 10 MG PO TABS: ORAL_TABLET | ORAL | Status: DC

## 2013-07-21 MED ORDER — AZITHROMYCIN 250 MG PO TABS: ORAL_TABLET | ORAL | Status: DC

## 2013-07-21 NOTE — Progress Notes (Signed)
Pre visit review using our clinic review tool, if applicable. No additional management support is needed unless otherwise documented below in the visit note. 

## 2013-07-21 NOTE — Progress Notes (Signed)
Subjective:    Patient ID: Jenny Acosta, female    DOB: 11-11-1945, 68 y.o.   MRN: [id number]  HPI  Here with 4 wks persistent left sided HA, unusual for her, only occas throbbing, mostly dull and constant, spends more than 18 hrs per day in bed, brought by son today very concerned.   Has had somewhat fever, facial pain, pressure, headache, general weakness and malaise, and greenish d/c, with mild ST and cough, but pt denies chest pain, wheezing, increased sob or doe, orthopnea, PND, increased LE swelling, palpitations, dizziness or syncope. Does have several wks ongoing nasal allergy symptoms with clearish congestion, itch and sneezing, without fever, pain, ST, cough, swelling or wheezing.  Pt denies new neurological symptoms such as new headache, or facial or extremity weakness or numbness   Pt denies polydipsia, polyuria.  Has ongoing depressive symptoms, but no suicidal ideation, or panic; has ongoing anxiety, declines other tx today,  + FMS per rheum in jan 2015. Due for carotid stenosis f/u, lipid check (not taking the lipitor due to muscle pain), due for prevnar, labs Past Medical History  Diagnosis Date  . Pain, coccyx   . GERD (gastroesophageal reflux disease)   . Hypertension   . Hypothyroidism   . Gallstone   . Carotid stenosis   . Impaired glucose tolerance   . Allergic rhinitis, cause unspecified   . Right lumbar radiculopathy 08/30/2010  . Hyperlipidemia 08/30/2010  . Arthritis    Past Surgical History  Procedure Laterality Date  . Cholecystectomy  2002  . Appendectomy  1953  . Tonsillectomy  1959  . Scarlet fever      age 68  . Cholecystectomy open  10 years ago    reports that she has never smoked. She does not have any smokeless tobacco history on file. She reports that she does not drink alcohol or use illicit drugs. family history includes Arthritis in her other; Cancer in her other; Diabetes in her other and other; Hyperlipidemia in her other; Stroke in her  other. Allergies  Allergen Reactions  . Methocarbamol Other (See Comments)    hallucinations  . Sulfa Antibiotics     unknown   Current Outpatient Prescriptions on File Prior to Visit  Medication Sig Dispense Refill  . acetaminophen (TYLENOL) 500 MG tablet Take 500 mg by mouth every 6 (six) hours as needed. For pain      . levothyroxine (SYNTHROID, LEVOTHROID) 75 MCG tablet TAKE 1 TABLET (75 MCG TOTAL) BY MOUTH DAILY.  30 tablet  10  . levothyroxine (SYNTHROID, LEVOTHROID) 75 MCG tablet TAKE 1 TABLET BY MOUTH EVERY DAY  30 tablet  11  . levothyroxine (SYNTHROID, LEVOTHROID) 75 MCG tablet TAKE 1 TABLET BY MOUTH EVERY DAY  90 tablet  1  . meclizine (ANTIVERT) 25 MG tablet TAKE 1 TABLET BY MOUTH 4 TIMES A DAY  120 tablet  0  . PARoxetine (PAXIL) 20 MG tablet TAKE 1 TABLET BY MOUTH EVERY MORNING  90 tablet  1  . PARoxetine (PAXIL) 30 MG tablet Take 1 tablet (30 mg total) by mouth daily.  90 tablet  3  . tiZANidine (ZANAFLEX) 2 MG tablet TAKE 1 TABLET BY MOUTH 3 TIMES A DAY  90 tablet  0  . tiZANidine (ZANAFLEX) 2 MG tablet TAKE 1 TABLET BY MOUTH 3 TIMES A DAY  90 tablet  0  . tiZANidine (ZANAFLEX) 2 MG tablet TAKE 1 TABLET BY MOUTH 3 TIMES A DAY  90 tablet  0  .  tiZANidine (ZANAFLEX) 2 MG tablet TAKE 1 TABLET BY MOUTH 3 TIMES A DAY  90 tablet  0  . traMADol (ULTRAM) 50 MG tablet TAKE ONE TABLET BY MOUTH EVERY 6 HOURS AS NEEDED FOR PAIN  120 tablet  2  . atorvastatin (LIPITOR) 20 MG tablet Take 1 tablet (20 mg total) by mouth daily.  90 tablet  3  . levothyroxine (SYNTHROID) 75 MCG tablet Take 1 tablet (75 mcg total) by mouth daily.  30 tablet  11   No current facility-administered medications on file prior to visit.   Review of Systems  Constitutional: Negative for unusual diaphoresis or other sweats  HENT: Negative for ringing in ear Eyes: Negative for double vision or worsening visual disturbance.  Respiratory: Negative for choking and stridor.   Gastrointestinal: Negative for vomiting  or other signifcant bowel change Genitourinary: Negative for hematuria or decreased urine volume.  Musculoskeletal: Negative for other MSK pain or swelling Skin: Negative for color change and worsening wound.  Neurological: Negative for tremors and numbness other than noted  Psychiatric/Behavioral: Negative for decreased concentration or agitation other than above       Objective:   Physical Exam BP 134/90  Pulse 108  Temp(Src) 97.9 F (36.6 C) (Oral)  Ht 5\' 4"  (1.626 m)  Wt 192 lb (87.091 kg)  BMI 32.94 kg/m2  SpO2 97% VS noted, mild ill Constitutional: Pt appears well-developed, well-nourished.  HENT: Head: NCAT.  Right Ear: External ear normal.  Left Ear: External ear normal.  Bilat tm's with mild erythema.  Max sinus areas mild tender.  Pharynx with mild erythema, no exudate Eyes: . Pupils are equal, round, and reactive to light. Conjunctivae and EOM are normal Neck: Normal range of motion. Neck supple.  Cardiovascular: Normal rate and regular rhythm.   Pulmonary/Chest: Effort normal and breath sounds normal.  Abd:  Soft, NT, ND, + BS Neurological: Pt is alert. Not confused , motor grossly intact Skin: Skin is warm. No rash Psychiatric: Pt behavior is normal. No agitation. 1-2+ nervous    Assessment & Plan:

## 2013-07-21 NOTE — Patient Instructions (Signed)
You had the new Prevnar pneumonia shot, and the steroid shot today  Please take all new medication as prescribed - the antibiotic, and low dose prednisone course  Please continue all other medications as before, and refills have been done if requested. Please have the pharmacy call with any other refills you may need.  Please continue your efforts at being more active, low cholesterol diet, and weight control.  You are otherwise up to date with prevention measures today.  You will be contacted regarding the referral for: MRI head, carotid test, and colonoscopy  Please go to the LAB in the Basement (turn left off the elevator) for the tests to be done today  You will be contacted by phone if any changes need to be made immediately.  Otherwise, you will receive a letter about your results with an explanation, but please check with MyChart first.  Please remember to sign up for MyChart if you have not done so, as this will be important to you in the future with finding out test results, communicating by private email, and scheduling acute appointments online when needed.  Please return in 6 months, or sooner if needed.

## 2013-07-22 ENCOUNTER — Other Ambulatory Visit: Payer: Self-pay | Admitting: Internal Medicine

## 2013-07-22 MED ORDER — SIMVASTATIN 40 MG PO TABS
40.0000 mg | ORAL_TABLET | Freq: Every day | ORAL | Status: DC
Start: 2013-07-22 — End: 2015-03-27

## 2013-07-23 DIAGNOSIS — J019 Acute sinusitis, unspecified: Secondary | ICD-10-CM | POA: Insufficient documentation

## 2013-07-23 NOTE — Assessment & Plan Note (Signed)
Mild to mod, for antibx course,  to f/u any worsening symptoms or concerns 

## 2013-07-23 NOTE — Assessment & Plan Note (Addendum)
stable overall by history and exam, recent data reviewed with pt, and pt to continue medical treatment as before,  to f/u any worsening symptoms or concerns Lab Results  Component Value Date   LDLCALC 193* 07/21/2013   For lower chol diet, has been statin intol to liptior, consider other trial statin

## 2013-07-23 NOTE — Assessment & Plan Note (Addendum)
Atypical , for head MRI, r/o mass, exam o/w benign;   Note:  Total time for pt hx, exam, review of record with pt in the room, determination of diagnoses and plan for further eval and tx is > 40 min, with over 50% spent in coordination and counseling of patient

## 2013-07-23 NOTE — Assessment & Plan Note (Signed)
Asympt,  to f/u any worsening symptoms or concerns Lab Results  Component Value Date   HGBA1C 5.4 07/21/2013

## 2013-07-23 NOTE — Assessment & Plan Note (Signed)
Due for f/u carotid dopplers, cont asa

## 2013-07-23 NOTE — Assessment & Plan Note (Signed)
With some joint pain as well, for esr

## 2013-07-23 NOTE — Assessment & Plan Note (Signed)
Also due for f/u colonoscopy 

## 2013-07-27 ENCOUNTER — Encounter: Payer: Self-pay | Admitting: Internal Medicine

## 2013-08-01 ENCOUNTER — Inpatient Hospital Stay: Admission: RE | Admit: 2013-08-01 | Payer: Medicare Other | Source: Ambulatory Visit

## 2013-08-01 ENCOUNTER — Encounter (HOSPITAL_COMMUNITY): Payer: Medicare Other

## 2013-08-05 ENCOUNTER — Other Ambulatory Visit: Payer: Self-pay | Admitting: Internal Medicine

## 2013-08-05 NOTE — Telephone Encounter (Signed)
Done hardcopy to robin  

## 2013-08-07 ENCOUNTER — Other Ambulatory Visit: Payer: Self-pay | Admitting: Internal Medicine

## 2013-08-09 NOTE — Telephone Encounter (Signed)
[facility]

## 2013-08-09 NOTE — Telephone Encounter (Signed)
tramaol already done June 12

## 2013-08-19 ENCOUNTER — Encounter (HOSPITAL_COMMUNITY): Payer: Medicare Other

## 2013-08-23 ENCOUNTER — Ambulatory Visit
Admission: RE | Admit: 2013-08-23 | Discharge: 2013-08-23 | Disposition: A | Discharge status: Home / Self Care | Payer: Medicare Other | Source: Ambulatory Visit | Attending: Internal Medicine | Admitting: Internal Medicine

## 2013-08-23 DIAGNOSIS — G9389 Other specified disorders of brain: Secondary | ICD-10-CM | POA: Diagnosis not present

## 2013-08-23 DIAGNOSIS — R51 Headache: Secondary | ICD-10-CM

## 2013-09-01 ENCOUNTER — Encounter (HOSPITAL_COMMUNITY): Payer: Medicare Other

## 2013-09-01 DIAGNOSIS — R0989 Other specified symptoms and signs involving the circulatory and respiratory systems: Secondary | ICD-10-CM

## 2013-09-06 ENCOUNTER — Encounter (HOSPITAL_COMMUNITY): Payer: Self-pay | Admitting: Cardiology

## 2013-09-08 ENCOUNTER — Telehealth: Payer: Self-pay

## 2013-09-08 MED ORDER — TIZANIDINE HCL 2 MG PO TABS: 2.0000 mg | ORAL_TABLET | Freq: Three times a day (TID) | ORAL | Status: DC

## 2013-09-08 NOTE — Telephone Encounter (Signed)
Refill done.  

## 2013-09-13 ENCOUNTER — Ambulatory Visit (HOSPITAL_COMMUNITY): Payer: Medicare Other | Attending: Cardiovascular Disease | Admitting: Cardiology

## 2013-09-13 DIAGNOSIS — R51 Headache: Secondary | ICD-10-CM | POA: Insufficient documentation

## 2013-09-13 DIAGNOSIS — I10 Essential (primary) hypertension: Secondary | ICD-10-CM | POA: Diagnosis not present

## 2013-09-13 DIAGNOSIS — I6529 Occlusion and stenosis of unspecified carotid artery: Secondary | ICD-10-CM

## 2013-09-13 DIAGNOSIS — E785 Hyperlipidemia, unspecified: Secondary | ICD-10-CM | POA: Diagnosis not present

## 2013-09-13 NOTE — Progress Notes (Signed)
Carotid duplex performed 

## 2013-09-21 ENCOUNTER — Encounter: Payer: Self-pay | Admitting: Internal Medicine

## 2013-09-24 ENCOUNTER — Encounter: Payer: Self-pay | Admitting: Internal Medicine

## 2013-09-26 ENCOUNTER — Other Ambulatory Visit: Payer: Self-pay | Admitting: Internal Medicine

## 2013-09-26 DIAGNOSIS — G8929 Other chronic pain: Secondary | ICD-10-CM

## 2013-09-26 DIAGNOSIS — R519 Headache, unspecified: Secondary | ICD-10-CM

## 2013-09-26 DIAGNOSIS — R51 Headache: Principal | ICD-10-CM

## 2013-10-04 ENCOUNTER — Encounter: Payer: Medicare Other | Admitting: Cardiology

## 2013-10-10 ENCOUNTER — Encounter: Payer: Medicare Other | Admitting: Internal Medicine

## 2013-10-14 ENCOUNTER — Ambulatory Visit: Payer: Medicare Other | Admitting: Family Medicine

## 2013-10-14 ENCOUNTER — Telehealth: Payer: Self-pay | Admitting: Family Medicine

## 2013-10-14 DIAGNOSIS — Z0289 Encounter for other administrative examinations: Secondary | ICD-10-CM

## 2013-10-14 NOTE — Telephone Encounter (Signed)
Noted  

## 2013-10-14 NOTE — Telephone Encounter (Signed)
Patient no showed for acute.  Please advise.

## 2013-10-18 DIAGNOSIS — Z049 Encounter for examination and observation for unspecified reason: Secondary | ICD-10-CM | POA: Diagnosis not present

## 2013-10-18 DIAGNOSIS — R51 Headache: Secondary | ICD-10-CM | POA: Diagnosis not present

## 2013-11-04 ENCOUNTER — Other Ambulatory Visit: Payer: Self-pay | Admitting: Internal Medicine

## 2013-11-05 ENCOUNTER — Other Ambulatory Visit: Payer: Self-pay | Admitting: Internal Medicine

## 2013-11-08 NOTE — Telephone Encounter (Signed)
Ultram  - Done hardcopy to [facility]bin  zanaflex done erx

## 2013-11-09 NOTE — Telephone Encounter (Signed)
Hardcopy has been faxed by covering [facility] 

## 2013-11-16 DIAGNOSIS — M542 Cervicalgia: Secondary | ICD-10-CM | POA: Diagnosis not present

## 2013-11-16 DIAGNOSIS — IMO0001 Reserved for inherently not codable concepts without codable children: Secondary | ICD-10-CM | POA: Diagnosis not present

## 2013-11-16 DIAGNOSIS — R51 Headache: Secondary | ICD-10-CM | POA: Diagnosis not present

## 2013-11-16 DIAGNOSIS — G518 Other disorders of facial nerve: Secondary | ICD-10-CM | POA: Diagnosis not present

## 2013-12-03 ENCOUNTER — Emergency Department: Payer: Self-pay | Admitting: Emergency Medicine

## 2013-12-03 DIAGNOSIS — G510 Bell's palsy: Secondary | ICD-10-CM | POA: Diagnosis not present

## 2013-12-03 DIAGNOSIS — R9431 Abnormal electrocardiogram [ECG] [EKG]: Secondary | ICD-10-CM | POA: Diagnosis not present

## 2013-12-03 DIAGNOSIS — R29810 Facial weakness: Secondary | ICD-10-CM | POA: Diagnosis not present

## 2013-12-03 DIAGNOSIS — R0602 Shortness of breath: Secondary | ICD-10-CM | POA: Diagnosis not present

## 2013-12-03 DIAGNOSIS — I639 Cerebral infarction, unspecified: Secondary | ICD-10-CM | POA: Diagnosis not present

## 2013-12-03 LAB — CBC
HCT: 46.7 % (ref 35.0–47.0)
HGB: 14.8 g/dL (ref 12.0–16.0)
MCH: 28.1 pg (ref 26.0–34.0)
MCHC: 31.7 g/dL — ABNORMAL LOW (ref 32.0–36.0)
MCV: 89 fL (ref 80–100)
Platelet: 275 10*3/uL (ref 150–440)
RBC: 5.27 10*6/uL — ABNORMAL HIGH (ref 3.80–5.20)
RDW: 14.1 % (ref 11.5–14.5)
WBC: 9.3 10*3/uL (ref 3.6–11.0)

## 2013-12-03 LAB — COMPREHENSIVE METABOLIC PANEL
Albumin: 3.6 g/dL (ref 3.4–5.0)
Alkaline Phosphatase: 110 U/L
Anion Gap: 8 (ref 7–16)
BUN: 10 mg/dL (ref 7–18)
Bilirubin,Total: 0.3 mg/dL (ref 0.2–1.0)
Calcium, Total: 8.7 mg/dL (ref 8.5–10.1)
Chloride: 109 mmol/L — ABNORMAL HIGH (ref 98–107)
Co2: 25 mmol/L (ref 21–32)
Creatinine: 0.71 mg/dL (ref 0.60–1.30)
EGFR (African American): >60
EGFR (Non-African Amer.): >60
Glucose: 106 mg/dL — ABNORMAL HIGH (ref 65–99)
Osmolality: 283 (ref 275–301)
Potassium: 3.7 mmol/L (ref 3.5–5.1)
SGOT(AST): 19 U/L (ref 15–37)
SGPT (ALT): 18 U/L
Sodium: 142 mmol/L (ref 136–145)
Total Protein: 7.5 g/dL (ref 6.4–8.2)

## 2013-12-15 ENCOUNTER — Encounter: Payer: Self-pay | Admitting: Internal Medicine

## 2013-12-15 ENCOUNTER — Ambulatory Visit: Payer: Medicare Other | Admitting: Internal Medicine

## 2013-12-15 ENCOUNTER — Ambulatory Visit (INDEPENDENT_AMBULATORY_CARE_PROVIDER_SITE_OTHER): Payer: Medicare Other | Admitting: Internal Medicine

## 2013-12-15 VITALS — BP 128/82 | HR 103 | Temp 98.0°F | Wt 196.2 lb

## 2013-12-15 DIAGNOSIS — I10 Essential (primary) hypertension: Secondary | ICD-10-CM

## 2013-12-15 DIAGNOSIS — I6529 Occlusion and stenosis of unspecified carotid artery: Secondary | ICD-10-CM | POA: Diagnosis not present

## 2013-12-15 DIAGNOSIS — E785 Hyperlipidemia, unspecified: Secondary | ICD-10-CM

## 2013-12-15 DIAGNOSIS — H6091 Unspecified otitis externa, right ear: Secondary | ICD-10-CM

## 2013-12-15 DIAGNOSIS — G510 Bell's palsy: Secondary | ICD-10-CM

## 2013-12-15 DIAGNOSIS — Z23 Encounter for immunization: Secondary | ICD-10-CM | POA: Diagnosis not present

## 2013-12-15 MED ORDER — LEVOFLOXACIN 250 MG PO TABS: 250.0000 mg | ORAL_TABLET | Freq: Every day | ORAL | Status: DC

## 2013-12-15 MED ORDER — ROSUVASTATIN CALCIUM 20 MG PO TABS: 20.0000 mg | ORAL_TABLET | Freq: Every day | ORAL | Status: DC

## 2013-12-15 MED ORDER — NEOMYCIN-COLIST-HC-THONZONIUM 3.3-3-10-0.5 MG/ML OT SUSP: 3.0000 [drp] | Freq: Four times a day (QID) | OTIC | Status: DC

## 2013-12-15 NOTE — Progress Notes (Signed)
Subjective:    Patient ID: Jenny Acosta, female    DOB: 12/28/1945, 68 y.o.   MRN: [id number]  HPI  Here to f/u right ear pain- finishing amoxil today for right ear ext otitis,sever, still hurts however, despite good med compliance. No fever, but has HA, malaise and general weakness,  But no ST, cough and Pt denies chest pain, increased sob or doe, wheezing, orthopnea, PND, increased LE swelling, palpitations, dizziness or syncope.  Had noted mild right bells palsy as well, now resolved.   Has been trying to follow lower chol diet, but unable to reduce LDL.  Also mentions migraine HA better with lidocaine shots. For flu shot today Past Medical History  Diagnosis Date  . Pain, coccyx   . GERD (gastroesophageal reflux disease)   . Hypertension   . Hypothyroidism   . Gallstone   . Carotid stenosis   . Impaired glucose tolerance   . Allergic rhinitis, cause unspecified   . Right lumbar radiculopathy 08/30/2010  . Hyperlipidemia 08/30/2010  . Arthritis   . Family history of colon cancer 07/21/2013    mother   Past Surgical History  Procedure Laterality Date  . Cholecystectomy  2002  . Appendectomy  1953  . Tonsillectomy  1959  . Scarlet fever      age 68  . Cholecystectomy open  10 years ago    reports that she has never smoked. She does not have any smokeless tobacco history on file. She reports that she does not drink alcohol or use illicit drugs. family history includes Arthritis in her other; Cancer in her other; Diabetes in her other and other; Hyperlipidemia in her other; Stroke in her other. Allergies  Allergen Reactions  . Lipitor [Atorvastatin]     myalgia  . Methocarbamol Other (See Comments)    hallucinations  . Simvastatin     Hair loss  . Sulfa Antibiotics     unknown   Current Outpatient Prescriptions on File Prior to Visit  Medication Sig Dispense Refill  . acetaminophen (TYLENOL) 500 MG tablet Take 500 mg by mouth every 6 (six) hours as needed. For pain      .  levothyroxine (SYNTHROID, LEVOTHROID) 75 MCG tablet TAKE 1 TABLET (75 MCG TOTAL) BY MOUTH DAILY.  30 tablet  10  . meclizine (ANTIVERT) 25 MG tablet TAKE 1 TABLET BY MOUTH 4 TIMES A DAY  120 tablet  0  . predniSONE (DELTASONE) 10 MG tablet 3 tabs by mouth per day for 3 days,2tabs per day for 3 days,1tab per day for 3 days  18 tablet  0  . simvastatin (ZOCOR) 40 MG tablet Take 1 tablet (40 mg total) by mouth at bedtime.  90 tablet  3  . tiZANidine (ZANAFLEX) 2 MG tablet TAKE 1 TABLET BY MOUTH 3 TIMES A DAY  90 tablet  0  . tiZANidine (ZANAFLEX) 2 MG tablet TAKE 1 TABLET BY MOUTH 3 TIMES A DAY  90 tablet  0  . tiZANidine (ZANAFLEX) 2 MG tablet TAKE 1 TABLET BY MOUTH 3 TIMES A DAY  90 tablet  0  . traMADol (ULTRAM) 50 MG tablet TAKE 1 TABLET BY MOUTH EVERY 6 HOURS AS NEEDED FOR PAIN  120 tablet  2  . levothyroxine (SYNTHROID) 75 MCG tablet Take 1 tablet (75 mcg total) by mouth daily.  30 tablet  11  . PARoxetine (PAXIL) 20 MG tablet TAKE 1 TABLET BY MOUTH EVERY MORNING  90 tablet  1  . PARoxetine (PAXIL) 30 MG  tablet Take 1 tablet (30 mg total) by mouth daily.  90 tablet  3   No current facility-administered medications on file prior to visit.    Review of Systems  Constitutional: Negative for unusual diaphoresis or other sweats  HENT: Negative for ringing in ear Eyes: Negative for double vision or worsening visual disturbance.  Respiratory: Negative for choking and stridor.   Gastrointestinal: Negative for vomiting or other signifcant bowel change Genitourinary: Negative for hematuria or decreased urine volume.  Musculoskeletal: Negative for other MSK pain or swelling Skin: Negative for color change and worsening wound.  Neurological: Negative for tremors and numbness other than noted  Psychiatric/Behavioral: Negative for decreased concentration or agitation other than above       Objective:   Physical Exam BP 128/82  Pulse 103  Temp(Src) 98 F (36.7 C) (Oral)  Wt 196 lb 4 oz (89.018  kg)  SpO2 95% VS noted, mild ill Constitutional: Pt appears well-developed, well-nourished.  HENT: Head: NCAT.  Right Ear: External ear normal. Right canal with 1-2+ red/sweling/tender with slight d/c Left Ear: External ear normal., left canal clear  Eyes: . Pupils are equal, round, and reactive to light. Conjunctivae and EOM are normal Neck: Normal range of motion. Neck supple.  Cardiovascular: Normal rate and regular rhythm.   Pulmonary/Chest: Effort normal and breath sounds normal.  Neurological: Pt is alert. Not confused , motor grossly intact, cn 2-12 intact Skin: Skin is warm. No rash Psychiatric: Pt behavior is normal. No agitation.     Assessment & Plan:

## 2013-12-15 NOTE — Progress Notes (Signed)
Pre visit review using our clinic review tool, if applicable. No additional management support is needed unless otherwise documented below in the visit note. 

## 2013-12-15 NOTE — Patient Instructions (Addendum)
Please take all new medication as prescribed - the crestor 20 mg per day, the levaquin antibiotic, and the ear drops  Please continue all other medications as before, and refills have been done if requested.  Please have the pharmacy call with any other refills you may need.  Please keep your appointments with your specialists as you may have planned  Please return in 6 months, or sooner if needed

## 2013-12-16 ENCOUNTER — Telehealth: Payer: Self-pay | Admitting: *Deleted

## 2013-12-16 NOTE — Telephone Encounter (Signed)
[facility] spoke [facility] she stated it was taking care of yesterday & pt has already pick drops up...Raechel Chute/lmb

## 2013-12-16 NOTE — Telephone Encounter (Signed)
Left msg on triage Thurs afternoon @ 5:17 pm. Requesting call back concerning ear drop rx that was sent tin...Raechel Chute/lmb

## 2013-12-18 DIAGNOSIS — G510 Bell's palsy: Secondary | ICD-10-CM | POA: Insufficient documentation

## 2013-12-18 DIAGNOSIS — H6091 Unspecified otitis externa, right ear: Secondary | ICD-10-CM | POA: Insufficient documentation

## 2013-12-18 NOTE — Assessment & Plan Note (Signed)
stable overall by history and exam, recent data reviewed with pt, and pt to continue medical treatment as before,  to f/u any worsening symptoms or concerns BP Readings from Last 3 Encounters:  12/15/13 128/82  07/21/13 134/90  03/15/13 136/70

## 2013-12-18 NOTE — Assessment & Plan Note (Signed)
stable overall by history and exam, recent data reviewed with pt, and pt to start crestor 10 qd,  to f/u any worsening symptoms or concerns Lab Results  Component Value Date   LDLCALC 193* 07/21/2013

## 2013-12-18 NOTE — Assessment & Plan Note (Signed)
Mild to mod, for antibx course,  to f/u any worsening symptoms or concerns 

## 2013-12-18 NOTE — Assessment & Plan Note (Signed)
By hx, now resolved after antiviral tx, exam benign , pt reassured

## 2013-12-27 ENCOUNTER — Telehealth: Payer: Self-pay | Admitting: Internal Medicine

## 2013-12-27 NOTE — Telephone Encounter (Signed)
Pt called said that she has finished meds, she is feeling better but her ear still has a lot of pain.  She wants to know if she needs another round or what she needs to do.

## 2013-12-27 NOTE — Telephone Encounter (Signed)
I could refer her to ENT if needed, o/w tylenol for pain for now, and consider Mucinex for congestion involving the inner ear may help as well

## 2013-12-28 NOTE — Telephone Encounter (Signed)
Called left a detailed message of MD instructions. 

## 2014-01-26 ENCOUNTER — Ambulatory Visit: Payer: Medicare Other | Admitting: Internal Medicine

## 2014-02-09 ENCOUNTER — Other Ambulatory Visit: Payer: Self-pay | Admitting: Internal Medicine

## 2014-02-10 NOTE — Telephone Encounter (Signed)
[facility] [place]

## 2014-02-10 NOTE — Telephone Encounter (Signed)
Done hardcopy to robin  

## 2014-02-21 ENCOUNTER — Other Ambulatory Visit: Payer: Self-pay | Admitting: Internal Medicine

## 2014-03-07 ENCOUNTER — Encounter: Payer: Self-pay | Admitting: Internal Medicine

## 2014-03-07 DIAGNOSIS — G894 Chronic pain syndrome: Secondary | ICD-10-CM

## 2014-03-23 ENCOUNTER — Other Ambulatory Visit: Payer: Self-pay | Admitting: Internal Medicine

## 2014-03-24 MED ORDER — PAROXETINE HCL 20 MG PO TABS: 20.0000 mg | ORAL_TABLET | Freq: Every morning | ORAL | Status: DC

## 2014-03-24 NOTE — Telephone Encounter (Signed)
Pt most recently on 20 mg paxil, not 30 mg by refill dates

## 2014-04-13 DIAGNOSIS — H2511 Age-related nuclear cataract, right eye: Secondary | ICD-10-CM | POA: Diagnosis not present

## 2014-04-13 DIAGNOSIS — H40033 Anatomical narrow angle, bilateral: Secondary | ICD-10-CM | POA: Diagnosis not present

## 2014-04-16 ENCOUNTER — Other Ambulatory Visit: Payer: Self-pay | Admitting: Internal Medicine

## 2014-04-19 ENCOUNTER — Ambulatory Visit: Payer: Self-pay | Admitting: Pain Medicine

## 2014-04-19 DIAGNOSIS — G43009 Migraine without aura, not intractable, without status migrainosus: Secondary | ICD-10-CM | POA: Diagnosis not present

## 2014-04-19 DIAGNOSIS — M4316 Spondylolisthesis, lumbar region: Secondary | ICD-10-CM | POA: Diagnosis not present

## 2014-04-19 DIAGNOSIS — M545 Low back pain: Secondary | ICD-10-CM | POA: Diagnosis not present

## 2014-04-19 DIAGNOSIS — M47817 Spondylosis without myelopathy or radiculopathy, lumbosacral region: Secondary | ICD-10-CM | POA: Diagnosis not present

## 2014-04-19 DIAGNOSIS — M542 Cervicalgia: Secondary | ICD-10-CM | POA: Diagnosis not present

## 2014-04-19 DIAGNOSIS — R51 Headache: Secondary | ICD-10-CM | POA: Diagnosis not present

## 2014-04-19 DIAGNOSIS — M797 Fibromyalgia: Secondary | ICD-10-CM | POA: Diagnosis not present

## 2014-04-19 DIAGNOSIS — M47896 Other spondylosis, lumbar region: Secondary | ICD-10-CM | POA: Diagnosis not present

## 2014-04-19 DIAGNOSIS — M5416 Radiculopathy, lumbar region: Secondary | ICD-10-CM | POA: Diagnosis not present

## 2014-04-19 DIAGNOSIS — M792 Neuralgia and neuritis, unspecified: Secondary | ICD-10-CM | POA: Diagnosis not present

## 2014-04-19 DIAGNOSIS — M546 Pain in thoracic spine: Secondary | ICD-10-CM | POA: Diagnosis not present

## 2014-05-03 ENCOUNTER — Ambulatory Visit: Admit: 2014-05-03 | Disposition: A | Discharge status: Home / Self Care | Payer: Self-pay | Admitting: Pain Medicine

## 2014-05-05 DIAGNOSIS — H2511 Age-related nuclear cataract, right eye: Secondary | ICD-10-CM | POA: Diagnosis not present

## 2014-05-09 ENCOUNTER — Ambulatory Visit: Payer: Self-pay | Admitting: Ophthalmology

## 2014-05-09 DIAGNOSIS — F418 Other specified anxiety disorders: Secondary | ICD-10-CM | POA: Diagnosis not present

## 2014-05-09 DIAGNOSIS — H2511 Age-related nuclear cataract, right eye: Secondary | ICD-10-CM | POA: Diagnosis not present

## 2014-05-09 DIAGNOSIS — R05 Cough: Secondary | ICD-10-CM | POA: Diagnosis not present

## 2014-05-09 DIAGNOSIS — Z882 Allergy status to sulfonamides status: Secondary | ICD-10-CM | POA: Diagnosis not present

## 2014-05-09 DIAGNOSIS — M549 Dorsalgia, unspecified: Secondary | ICD-10-CM | POA: Diagnosis not present

## 2014-05-09 DIAGNOSIS — R42 Dizziness and giddiness: Secondary | ICD-10-CM | POA: Diagnosis not present

## 2014-05-09 DIAGNOSIS — K219 Gastro-esophageal reflux disease without esophagitis: Secondary | ICD-10-CM | POA: Diagnosis not present

## 2014-05-09 DIAGNOSIS — M797 Fibromyalgia: Secondary | ICD-10-CM | POA: Diagnosis not present

## 2014-05-09 DIAGNOSIS — G510 Bell's palsy: Secondary | ICD-10-CM | POA: Diagnosis not present

## 2014-05-09 DIAGNOSIS — E079 Disorder of thyroid, unspecified: Secondary | ICD-10-CM | POA: Diagnosis not present

## 2014-05-09 DIAGNOSIS — M1990 Unspecified osteoarthritis, unspecified site: Secondary | ICD-10-CM | POA: Diagnosis not present

## 2014-05-13 ENCOUNTER — Other Ambulatory Visit: Payer: Self-pay | Admitting: Internal Medicine

## 2014-05-16 NOTE — Telephone Encounter (Signed)
Rx sent 

## 2014-05-16 NOTE — Telephone Encounter (Signed)
Done hardcopy to [facility]  

## 2014-05-26 DIAGNOSIS — H2512 Age-related nuclear cataract, left eye: Secondary | ICD-10-CM | POA: Diagnosis not present

## 2014-05-29 ENCOUNTER — Telehealth: Payer: Self-pay | Admitting: Internal Medicine

## 2014-05-29 NOTE — Telephone Encounter (Signed)
Pt called in said that she was suppose to have 30mg  of the PARoxetine (PAXIL) 20 MG tablet [[id number]][[id number]] instead of the 20 mg .  She has been taking 1 1/2 tabs instead of 1 and now she is out.  She is need a refill on this med

## 2014-05-30 ENCOUNTER — Ambulatory Visit
Admit: 2014-05-30 | Disposition: A | Discharge status: Home / Self Care | Payer: Self-pay | Attending: Ophthalmology | Admitting: Ophthalmology

## 2014-05-30 DIAGNOSIS — Z882 Allergy status to sulfonamides status: Secondary | ICD-10-CM | POA: Diagnosis not present

## 2014-05-30 DIAGNOSIS — M1990 Unspecified osteoarthritis, unspecified site: Secondary | ICD-10-CM | POA: Diagnosis not present

## 2014-05-30 DIAGNOSIS — H2512 Age-related nuclear cataract, left eye: Secondary | ICD-10-CM | POA: Diagnosis not present

## 2014-05-30 DIAGNOSIS — Z9849 Cataract extraction status, unspecified eye: Secondary | ICD-10-CM | POA: Diagnosis not present

## 2014-05-30 DIAGNOSIS — E039 Hypothyroidism, unspecified: Secondary | ICD-10-CM | POA: Diagnosis not present

## 2014-05-30 DIAGNOSIS — Z9889 Other specified postprocedural states: Secondary | ICD-10-CM | POA: Diagnosis not present

## 2014-05-30 DIAGNOSIS — H9190 Unspecified hearing loss, unspecified ear: Secondary | ICD-10-CM | POA: Diagnosis not present

## 2014-05-30 DIAGNOSIS — M797 Fibromyalgia: Secondary | ICD-10-CM | POA: Diagnosis not present

## 2014-05-30 MED ORDER — PAROXETINE HCL 20 MG PO TABS: ORAL_TABLET | ORAL | Status: DC

## 2014-05-30 NOTE — Telephone Encounter (Signed)
Done erx 

## 2014-05-31 NOTE — Telephone Encounter (Signed)
Called pt no answer LMOM md [facility]

## 2014-06-13 ENCOUNTER — Other Ambulatory Visit: Payer: Self-pay | Admitting: Internal Medicine

## 2014-06-25 NOTE — Op Note (Signed)
PATIENT NAMAnson Acosta:  Chriscoe, Makenli MR#:  [id number] DATE OF BIRTH:  1945-08-04  DATE OF PROCEDURE:  05/30/2014  PREOPERATIVE DIAGNOSIS:  Nuclear sclerotic cataract of the left eye.   POSTOPERATIVE DIAGNOSIS:  Nuclear sclerotic cataract of the left eye.   OPERATIVE PROCEDURE:  Cataract extraction by phacoemulsification with implant of intraocular lens to left eye.   SURGEON:  Galen ManilaWilliam Makalyn Lennox, MD.   ANESTHESIA:  1. Managed anesthesia care.  2. Topical tetracaine drops followed by 2% Xylocaine jelly applied in the preoperative holding area.   COMPLICATIONS:  None.   TECHNIQUE:   Stop and chop.  DESCRIPTION OF PROCEDURE:  The patient was examined and consented in the preoperative holding area where the aforementioned topical anesthesia was applied to the left eye and then brought back to the Operating Room where the left eye was prepped and draped in the usual sterile ophthalmic fashion and a lid speculum was placed. A paracentesis was created with the side port blade and the anterior chamber was filled with viscoelastic. A near clear corneal incision was performed with the steel keratome. A continuous curvilinear capsulorrhexis was performed with a cystotome followed by the capsulorrhexis forceps. Hydrodissection and hydrodelineation were carried out with BSS on a blunt cannula. The lens was removed in a stop and chop technique and the remaining cortical material was removed with the irrigation-aspiration handpiece. The capsular bag was inflated with viscoelastic and the Tecnis ZCB00 21.5-diopter lens, serial number [id number] was placed in the capsular bag without complication. The remaining viscoelastic was removed from the eye with the irrigation-aspiration handpiece. The wounds were hydrated. The anterior chamber was flushed with Miostat and the eye was inflated to physiologic pressure. 0.1 mL of cefuroxime concentration 10 mg/mL was placed in the anterior chamber. The wounds were found to be water  tight. The eye was dressed with Vigamox. The patient was given protective glasses to wear throughout the day and a shield with which to sleep tonight. The patient was also given drops with which to begin a drop regimen today and will follow-up with me in one day.    ____________________________ Jerilee FieldWilliam L. Drexler Maland, MD wlp:sb D: 05/30/2014 12:31:55 ET T: 05/30/2014 12:52:52 ET JOB#: [id number]  cc: Riyan Haile L. Solace Wendorff, MD, <Dictator> Jerilee FieldWILLIAM L Haliegh Khurana MD ELECTRONICALLY SIGNED 05/31/2014 12:36

## 2014-06-25 NOTE — Op Note (Signed)
PATIENT NAMAnson [place]:  Acosta, Jenny Acosta MR#:  [id number] DATE OF BIRTH:  1945-03-16  DATE OF PROCEDURE:  05/09/2014  PREOPERATIVE DIAGNOSIS:  Nuclear sclerotic cataract of the right eye.   POSTOPERATIVE DIAGNOSIS:  Nuclear sclerotic cataract of the right eye.   OPERATIVE PROCEDURE:  Cataract extraction by phacoemulsification with implant of intraocular lens to right eye.   SURGEON:  Galen ManilaWilliam Cyani Kallstrom, MD.   ANESTHESIA:  1. Managed anesthesia care.  2. Topical tetracaine drops followed by 2% Xylocaine jelly applied in the preoperative holding area.   COMPLICATIONS:  None.   TECHNIQUE:   Stop and chop.  DESCRIPTION OF PROCEDURE:  The patient was examined and consented in the preoperative holding area where the aforementioned topical anesthesia was applied to the right eye and then brought back to the Operating Room where the right eye was prepped and draped in the usual sterile ophthalmic fashion and a lid speculum was placed. A paracentesis was created with the side port blade and the anterior chamber was filled with viscoelastic. A near clear corneal incision was performed with the steel keratome. A continuous curvilinear capsulorrhexis was performed with a cystotome followed by the capsulorrhexis forceps. Hydrodissection and hydrodelineation were carried out with BSS on a blunt cannula. The lens was removed in a stop and chop technique and the remaining cortical material was removed with the irrigation-aspiration handpiece. The capsular bag was inflated with viscoelastic and the Tecnis ZCB00 21.5-diopter lens, serial number [id number] was placed in the capsular bag without complication. The remaining viscoelastic was removed from the eye with the irrigation-aspiration handpiece. The wounds were hydrated. The anterior chamber was flushed with Miostat and the eye was inflated to physiologic pressure. 0.1 mL of cefuroxime concentration 10 mg/mL was placed in the anterior chamber. The wounds were found to be  water tight. The eye was dressed with Vigamox. The patient was given protective glasses to wear throughout the day and a shield with which to sleep tonight. The patient was also given drops with which to begin a drop regimen today and will follow-up with me in one day.    ____________________________ Jerilee FieldWilliam L. Estella Malatesta, MD wlp:bm D: 05/09/2014 21:45:41 ET T: 05/10/2014 06:20:49 ET JOB#: [id number]  cc: Dedric Ethington L. Zuhayr Deeney, MD, <Dictator> Jerilee FieldWILLIAM L Eytan Carrigan MD ELECTRONICALLY SIGNED 05/10/2014 11:00

## 2014-07-10 ENCOUNTER — Other Ambulatory Visit: Payer: Self-pay | Admitting: Internal Medicine

## 2014-08-04 ENCOUNTER — Other Ambulatory Visit: Payer: Self-pay | Admitting: Internal Medicine

## 2014-09-04 ENCOUNTER — Other Ambulatory Visit: Payer: Self-pay | Admitting: Internal Medicine

## 2014-09-14 ENCOUNTER — Other Ambulatory Visit: Payer: Self-pay

## 2014-09-14 MED ORDER — TRAMADOL HCL 50 MG PO TABS: 50.0000 mg | ORAL_TABLET | Freq: Four times a day (QID) | ORAL | Status: DC | PRN

## 2014-09-14 NOTE — Telephone Encounter (Signed)
Please advise on refill, last OV 12/15/2013

## 2014-09-14 NOTE — Telephone Encounter (Signed)
Done hardcopy to D[facility]  

## 2014-09-14 NOTE — Telephone Encounter (Signed)
Rx faxed to pharmacy  

## 2014-10-03 ENCOUNTER — Other Ambulatory Visit: Payer: Self-pay | Admitting: Internal Medicine

## 2014-10-04 ENCOUNTER — Other Ambulatory Visit: Payer: Self-pay | Admitting: Internal Medicine

## 2014-10-24 ENCOUNTER — Encounter: Payer: Self-pay | Admitting: Internal Medicine

## 2014-10-24 ENCOUNTER — Ambulatory Visit (INDEPENDENT_AMBULATORY_CARE_PROVIDER_SITE_OTHER): Payer: Medicare Other | Admitting: Internal Medicine

## 2014-10-24 ENCOUNTER — Other Ambulatory Visit (INDEPENDENT_AMBULATORY_CARE_PROVIDER_SITE_OTHER): Payer: Medicare Other

## 2014-10-24 VITALS — BP 130/84 | HR 102 | Temp 97.8°F | Ht 64.0 in | Wt 206.0 lb

## 2014-10-24 DIAGNOSIS — E785 Hyperlipidemia, unspecified: Secondary | ICD-10-CM

## 2014-10-24 DIAGNOSIS — M797 Fibromyalgia: Secondary | ICD-10-CM | POA: Diagnosis not present

## 2014-10-24 DIAGNOSIS — I10 Essential (primary) hypertension: Secondary | ICD-10-CM

## 2014-10-24 DIAGNOSIS — R739 Hyperglycemia, unspecified: Secondary | ICD-10-CM

## 2014-10-24 DIAGNOSIS — G4700 Insomnia, unspecified: Secondary | ICD-10-CM

## 2014-10-24 DIAGNOSIS — E039 Hypothyroidism, unspecified: Secondary | ICD-10-CM

## 2014-10-24 LAB — URINALYSIS, ROUTINE W REFLEX MICROSCOPIC
Bilirubin Urine: NEGATIVE
Hgb urine dipstick: NEGATIVE
Ketones, ur: NEGATIVE
Nitrite: NEGATIVE
Specific Gravity, Urine: 1.025 (ref 1.000–1.030)
Total Protein, Urine: NEGATIVE
Urine Glucose: NEGATIVE
Urobilinogen, UA: 0.2 (ref 0.0–1.0)
pH: 6 (ref 5.0–8.0)

## 2014-10-24 LAB — CBC WITH DIFFERENTIAL/PLATELET
Basophils Absolute: 0 10*3/uL (ref 0.0–0.1)
Basophils Relative: 0.3 % (ref 0.0–3.0)
Eosinophils Absolute: 0.2 10*3/uL (ref 0.0–0.7)
Eosinophils Relative: 1.8 % (ref 0.0–5.0)
HCT: 46.8 % — ABNORMAL HIGH (ref 36.0–46.0)
Hemoglobin: 15.7 g/dL — ABNORMAL HIGH (ref 12.0–15.0)
Lymphocytes Relative: 40 % (ref 12.0–46.0)
Lymphs Abs: 3.5 10*3/uL (ref 0.7–4.0)
MCHC: 33.5 g/dL (ref 30.0–36.0)
MCV: 84.8 fl (ref 78.0–100.0)
Monocytes Absolute: 0.6 10*3/uL (ref 0.1–1.0)
Monocytes Relative: 6.9 % (ref 3.0–12.0)
Neutro Abs: 4.4 10*3/uL (ref 1.4–7.7)
Neutrophils Relative %: 51 % (ref 43.0–77.0)
Platelets: 285 10*3/uL (ref 150.0–400.0)
RBC: 5.52 Mil/uL — ABNORMAL HIGH (ref 3.87–5.11)
RDW: 15 % (ref 11.5–15.5)
WBC: 8.7 10*3/uL (ref 4.0–10.5)

## 2014-10-24 LAB — HEMOGLOBIN A1C: Hgb A1c MFr Bld: 5.5 % (ref 4.6–6.5)

## 2014-10-24 MED ORDER — TRAZODONE HCL 50 MG PO TABS: 25.0000 mg | ORAL_TABLET | Freq: Every evening | ORAL | Status: DC | PRN

## 2014-10-24 NOTE — Progress Notes (Signed)
Pre visit review using our clinic review tool, if applicable. No additional management support is needed unless otherwise documented below in the visit note. 

## 2014-10-24 NOTE — Assessment & Plan Note (Signed)
stable overall by history and exam, recent data reviewed with pt, and pt to continue medical treatment as before,  to f/u any worsening symptoms or concerns BP Readings from Last 3 Encounters:  10/24/14 130/84  12/15/13 128/82  07/21/13 134/90

## 2014-10-24 NOTE — Progress Notes (Signed)
Subjective:    Patient ID: Jenny Acosta, female    DOB: 1945-05-08, 69 y.o.   MRN: [id number]  HPI Here for yearly f/u;  Overall doing ok;  Pt denies Chest pain, worsening SOB, DOE, wheezing, orthopnea, PND, worsening LE edema, palpitations, dizziness or syncope.  Pt denies neurological change such as new headache, facial or extremity weakness.  Pt denies polydipsia, polyuria, or low sugar symptoms. Pt states overall good compliance with treatment and medications, good tolerability, and has been trying to follow appropriate diet.  Pt denies worsening depressive symptoms, suicidal ideation or panic. No fever, night sweats, wt loss, loss of appetite, or other constitutional symptoms.  Pt states good ability with ADL's, has low fall risk, home safety reviewed and adequate, no other significant changes in hearing or vision, and only occasionally active with exercise.   Pt asks for cologuard instead of colonscopy for now. Decliens mammogram.  Asks for lyme disease check due to myalgais, and mult tick bites in past. Does c/o ongoing fatigue, but denies signficant daytime hypersomnolence. Just feels sluggish most days, no snoring, though does have ongoing nasal congestion and post nasal gtt, mucinex helped the drainage, but cuased her to vomit.  Still with tremors and FMS pain.  Asks for trazodone, has tried ambien 10 mg once and was "out of it" Has occas shooting pains to right hand and lateral right foot.  Past Medical History  Diagnosis Date  . Pain, coccyx   . GERD (gastroesophageal reflux disease)   . Hypertension   . Hypothyroidism   . Gallstone   . Carotid stenosis   . Impaired glucose tolerance   . Allergic rhinitis, cause unspecified   . Right lumbar radiculopathy 08/30/2010  . Hyperlipidemia 08/30/2010  . Arthritis   . Family history of colon cancer 07/21/2013    mother   Past Surgical History  Procedure Laterality Date  . Cholecystectomy  2002  . Appendectomy  1953  . Tonsillectomy  1959    . Scarlet fever      age 73  . Cholecystectomy open  10 years ago    reports that she has never smoked. She does not have any smokeless tobacco history on file. She reports that she does not drink alcohol or use illicit drugs. family history includes Arthritis in her other; Cancer in her other; Diabetes in her other and other; Hyperlipidemia in her other; Stroke in her other. Allergies  Allergen Reactions  . Lipitor [Atorvastatin]     myalgia  . Methocarbamol Other (See Comments)    hallucinations  . Simvastatin     Hair loss  . Sulfa Antibiotics     unknown   Current Outpatient Prescriptions on File Prior to Visit  Medication Sig Dispense Refill  . acetaminophen (TYLENOL) 500 MG tablet Take 500 mg by mouth every 6 (six) hours as needed. For pain    . levothyroxine (SYNTHROID, LEVOTHROID) 75 MCG tablet TAKE 1 TABLET (75 MCG TOTAL) BY MOUTH DAILY. 30 tablet 10  . neomycin-colistin-hydrocortisone-thonzonium (CORTISPORIN-TC) 3.04-26-08-0.5 MG/ML otic suspension Place 3 drops into the right ear 4 (four) times daily. 10 mL 0  . PARoxetine (PAXIL) 20 MG tablet 1 and 1/2 tab by mouth per day 135 tablet 3  . tiZANidine (ZANAFLEX) 2 MG tablet TAKE 1 TABLET BY MOUTH 3 TIMES A DAY 90 tablet 0  . traMADol (ULTRAM) 50 MG tablet Take 1 tablet (50 mg total) by mouth every 6 (six) hours as needed. for pain 120 tablet 2  .  levofloxacin (LEVAQUIN) 250 MG tablet Take 1 tablet (250 mg total) by mouth daily. (Patient not taking: Reported on 10/24/2014) 10 tablet 0  . levothyroxine (SYNTHROID) 75 MCG tablet Take 1 tablet (75 mcg total) by mouth daily. 30 tablet 11  . meclizine (ANTIVERT) 25 MG tablet TAKE 1 TABLET BY MOUTH 4 TIMES A DAY (Patient not taking: Reported on 10/24/2014) 120 tablet 0  . predniSONE (DELTASONE) 10 MG tablet 3 tabs by mouth per day for 3 days,2tabs per day for 3 days,1tab per day for 3 days (Patient not taking: Reported on 10/24/2014) 18 tablet 0  . rosuvastatin (CRESTOR) 20 MG tablet  Take 1 tablet (20 mg total) by mouth daily. (Patient not taking: Reported on 10/24/2014) 90 tablet 3  . simvastatin (ZOCOR) 40 MG tablet Take 1 tablet (40 mg total) by mouth at bedtime. (Patient not taking: Reported on 10/24/2014) 90 tablet 3   No current facility-administered medications on file prior to visit.     Review of Systems Constitutional: Negative for increased diaphoresis, other activity, appetite or siginficant weight change other than noted HENT: Negative for worsening hearing loss, ear pain, facial swelling, mouth sores and neck stiffness.   Eyes: Negative for other worsening pain, redness or visual disturbance.  Respiratory: Negative for shortness of breath and wheezing  Cardiovascular: Negative for chest pain and palpitations.  Gastrointestinal: Negative for diarrhea, blood in stool, abdominal distention or other pain Genitourinary: Negative for hematuria, flank pain or change in urine volume.  Musculoskeletal: Negative for myalgias or other joint complaints.  Skin: Negative for color change and wound or drainage.  Neurological: Negative for syncope and numbness. other than noted Hematological: Negative for adenopathy. or other swelling Psychiatric/Behavioral: Negative for hallucinations, SI, self-injury, decreased concentration or other worsening agitation.      Objective:   Physical Exam BP 130/84 mmHg  Pulse 102  Temp(Src) 97.8 F (36.6 C) (Oral)  Ht  (1.626 m)  Wt 206 lb (93.441 kg)  BMI 35.34 kg/m2  SpO2 96% VS noted,  Constitutional: Pt is oriented to person, place, and time. Appears well-developed and well-nourished, in no significant distress Head: Normocephalic and atraumatic.  Right Ear: External ear normal.  Left Ear: External ear normal.  Nose: Nose normal.  Mouth/Throat: Oropharynx is clear and moist.  Eyes: Conjunctivae and EOM are normal. Pupils are equal, round, and reactive to light.  Neck: Normal range of motion. Neck supple. No JVD  present. No tracheal deviation present or significant neck LA or mass Cardiovascular: Normal rate, regular rhythm, normal heart sounds and intact distal pulses.   Pulmonary/Chest: Effort normal and breath sounds without rales or wheezing  Abdominal: Soft. Bowel sounds are normal. NT. No HSM  Musculoskeletal: Normal range of motion. Exhibits no edema.  Lymphadenopathy:  Has no cervical adenopathy.  Neurological: Pt is alert and oriented to person, place, and time. Pt has normal reflexes. No cranial nerve deficit. Motor grossly intact Skin: Skin is warm and dry. No rash noted.  Psychiatric:  Has normal mood and affect. Behavior is normal.     Assessment & Plan:

## 2014-10-24 NOTE — Assessment & Plan Note (Signed)
Asympt, stable overall by history and exam, recent data reviewed with pt, and pt to continue medical treatment as before,  to f/u any worsening symptoms or concerns Lab Results  Component Value Date   TSH 2.18 07/21/2013   For f/u lab

## 2014-10-24 NOTE — Patient Instructions (Signed)
Please take all new medication as prescribed  - the trazodone  Please continue all other medications as before  Please have the pharmacy call with any other refills you may need.  Please continue your efforts at being more active, low cholesterol diet, and weight control.  You are otherwise up to date with prevention measures today.  You will be contacted regarding the referral for: Cologaurd  Please keep your appointments with your specialists as you may have planned   Please go to the LAB in the Basement (turn left off the elevator) for the tests to be done today, includin the lyme disease tests  You will be contacted by phone if any changes need to be made immediately.  Otherwise, you will receive a letter about your results with an explanation, but please check with MyChart first.  Please remember to sign up for MyChart if you have not done so, as this will be important to you in the future with finding out test results, communicating by private email, and scheduling acute appointments online when needed.  Please return in 6 months, or sooner if needed

## 2014-10-24 NOTE — Assessment & Plan Note (Signed)
With ongoing myalgias, stable overall by history and exam, recent data reviewed with pt, and pt to continue medical treatment as before,  to f/u any worsening symptoms or concerns, but also for lyme dz titers

## 2014-10-24 NOTE — Assessment & Plan Note (Signed)
Ok for trial trazodone qhs prn,  to f/u any worsening symptoms or concerns  

## 2014-10-24 NOTE — Assessment & Plan Note (Signed)
stable overall by history and exam, recent data reviewed with pt, and pt to continue medical treatment as before,  to f/u any worsening symptoms or concerns Lab Results  Component Value Date   LDLCALC 193* 07/21/2013

## 2014-10-25 LAB — LIPID PANEL
Cholesterol: 314 mg/dL — ABNORMAL HIGH (ref 0–200)
HDL: 53.9 mg/dL (ref >39.00)
NonHDL: 260.51
Total CHOL/HDL Ratio: 6
Triglycerides: 207 mg/dL — ABNORMAL HIGH (ref 0.0–149.0)
VLDL: 41.4 mg/dL — ABNORMAL HIGH (ref 0.0–40.0)

## 2014-10-25 LAB — BASIC METABOLIC PANEL
BUN: 18 mg/dL (ref 6–23)
CO2: 24 mEq/L (ref 19–32)
Calcium: 9.6 mg/dL (ref 8.4–10.5)
Chloride: 106 mEq/L (ref 96–112)
Creatinine, Ser: 0.83 mg/dL (ref 0.40–1.20)
GFR: 72.32 mL/min (ref >60.00)
Glucose, Bld: 110 mg/dL — ABNORMAL HIGH (ref 70–99)
Potassium: 4.4 mEq/L (ref 3.5–5.1)
Sodium: 139 mEq/L (ref 135–145)

## 2014-10-25 LAB — HEPATIC FUNCTION PANEL
ALT: 12 U/L (ref 0–35)
AST: 14 U/L (ref 0–37)
Albumin: 4.3 g/dL (ref 3.5–5.2)
Alkaline Phosphatase: 121 U/L — ABNORMAL HIGH (ref 39–117)
Bilirubin, Direct: 0.1 mg/dL (ref 0.0–0.3)
Total Bilirubin: 0.5 mg/dL (ref 0.2–1.2)
Total Protein: 7.8 g/dL (ref 6.0–8.3)

## 2014-10-25 LAB — TSH: TSH: 1.74 u[IU]/mL (ref 0.35–4.50)

## 2014-10-25 LAB — LDL CHOLESTEROL, DIRECT: Direct LDL: 219 mg/dL

## 2014-10-26 ENCOUNTER — Other Ambulatory Visit: Payer: Self-pay | Admitting: Internal Medicine

## 2014-10-30 ENCOUNTER — Other Ambulatory Visit: Payer: Self-pay | Admitting: Internal Medicine

## 2015-01-12 ENCOUNTER — Other Ambulatory Visit: Payer: Self-pay | Admitting: Internal Medicine

## 2015-01-12 NOTE — Telephone Encounter (Signed)
Done hardcopy to D[facility]  

## 2015-01-16 ENCOUNTER — Other Ambulatory Visit: Payer: Self-pay | Admitting: Internal Medicine

## 2015-01-16 NOTE — Telephone Encounter (Signed)
Tramadol too soon since last rx was nov 18

## 2015-01-16 NOTE — Telephone Encounter (Signed)
Rx faxed to pharmacy  

## 2015-01-19 ENCOUNTER — Other Ambulatory Visit: Payer: Self-pay | Admitting: Internal Medicine

## 2015-02-14 ENCOUNTER — Telehealth: Payer: Self-pay | Admitting: Internal Medicine

## 2015-02-14 NOTE — Telephone Encounter (Signed)
Lm for pt to call back to sec AWV

## 2015-03-15 ENCOUNTER — Ambulatory Visit: Payer: Medicare Other | Admitting: Internal Medicine

## 2015-03-27 ENCOUNTER — Ambulatory Visit (INDEPENDENT_AMBULATORY_CARE_PROVIDER_SITE_OTHER): Payer: Medicare Other | Admitting: Internal Medicine

## 2015-03-27 ENCOUNTER — Encounter: Payer: Self-pay | Admitting: Internal Medicine

## 2015-03-27 VITALS — BP 130/82 | HR 106 | Temp 98.9°F | Resp 20 | Ht 64.0 in | Wt 220.0 lb

## 2015-03-27 DIAGNOSIS — H6692 Otitis media, unspecified, left ear: Secondary | ICD-10-CM | POA: Diagnosis not present

## 2015-03-27 DIAGNOSIS — K219 Gastro-esophageal reflux disease without esophagitis: Secondary | ICD-10-CM | POA: Diagnosis not present

## 2015-03-27 DIAGNOSIS — G4700 Insomnia, unspecified: Secondary | ICD-10-CM | POA: Diagnosis not present

## 2015-03-27 DIAGNOSIS — I6523 Occlusion and stenosis of bilateral carotid arteries: Secondary | ICD-10-CM | POA: Diagnosis not present

## 2015-03-27 MED ORDER — TIZANIDINE HCL 4 MG PO TABS
4.0000 mg | ORAL_TABLET | Freq: Three times a day (TID) | ORAL | Status: DC | PRN
Start: 2015-03-27 — End: 2015-06-23

## 2015-03-27 MED ORDER — LEVOFLOXACIN 250 MG PO TABS: 250.0000 mg | ORAL_TABLET | Freq: Every day | ORAL | Status: DC

## 2015-03-27 MED ORDER — PANTOPRAZOLE SODIUM 40 MG PO TBEC: 40.0000 mg | DELAYED_RELEASE_TABLET | Freq: Every day | ORAL | Status: DC

## 2015-03-27 NOTE — Progress Notes (Signed)
Pre visit review using our clinic review tool, if applicable. No additional management support is needed unless otherwise documented below in the visit note. 

## 2015-03-27 NOTE — Patient Instructions (Signed)
Please take all new medication as prescribed - the antibiotic, and protonix for reflux  Ok to stop the trazodone as you have  OK to increase the tizanidine to 4 mg as needed  Please continue all other medications as before  Please have the pharmacy call with any other refills you may need.  Please continue your efforts at being more active, low cholesterol diet, and weight control.  Please keep your appointments with your specialists as you may have planned  You will be contacted regarding the referral for: Carotid artery testing

## 2015-03-27 NOTE — Progress Notes (Signed)
Subjective:    Patient ID: Jenny Acosta, female    DOB: [birth year], 70 y.o.   MRN: [id number]  HPI   Here with 2-3 days acute onset fever, left ear pain, pressure without hearing loss/discharge/tinnitus, but with headache, general weakness and malaise, with mild ST and non prod cough, but pt denies chest pain, wheezing, increased sob or doe, orthopnea, PND, increased LE swelling, palpitations, dizziness or syncope.  Also , Trazodone not working for sleep, Taking higher dose zanaflex helps  Missed f/u carotid testing, asks for reorder test.  Also Has had mild to mod worsening reflux but no abd pain, dysphagia, n/v, bowel change or blood.  Asks for restart PPI.  Pt denies new neurological symptoms such as new facial or extremity weakness or numbness   Pt denies polydipsia, polyuria,  Past Medical History  Diagnosis Date  . Pain, coccyx   . GERD (gastroesophageal reflux disease)   . Hypertension   . Hypothyroidism   . Gallstone   . Carotid stenosis   . Impaired glucose tolerance   . Allergic rhinitis, cause unspecified   . Right lumbar radiculopathy 08/30/2010  . Hyperlipidemia 08/30/2010  . Arthritis   . Family history of colon cancer 07/21/2013    mother   Past Surgical History  Procedure Laterality Date  . Cholecystectomy  2002  . Appendectomy  1953  . Tonsillectomy  1959  . Scarlet fever      age 54  . Cholecystectomy open  10 years ago    reports that she has never smoked. She does not have any smokeless tobacco history on file. She reports that she does not drink alcohol or use illicit drugs. family history includes Arthritis in her other; Cancer in her other; Diabetes in her other and other; Hyperlipidemia in her other; Stroke in her other. Allergies  Allergen Reactions  . Lipitor [Atorvastatin]     myalgia  . Methocarbamol Other (See Comments)    hallucinations  . Simvastatin     Hair loss  . Sulfa Antibiotics     unknown   Current Outpatient Prescriptions on File Prior  to Visit  Medication Sig Dispense Refill  . acetaminophen (TYLENOL) 500 MG tablet Take 500 mg by mouth every 6 (six) hours as needed. For pain    . levothyroxine (SYNTHROID, LEVOTHROID) 75 MCG tablet TAKE 1 TABLET (75 MCG TOTAL) BY MOUTH DAILY. 30 tablet 10  . levothyroxine (SYNTHROID, LEVOTHROID) 75 MCG tablet TAKE 1 TABLET BY MOUTH EVERY DAY 90 tablet 3  . meclizine (ANTIVERT) 25 MG tablet TAKE 1 TABLET BY MOUTH 4 TIMES A DAY 120 tablet 0  . neomycin-colistin-hydrocortisone-thonzonium (CORTISPORIN-TC) 3.04-26-08-0.5 MG/ML otic suspension Place 3 drops into the right ear 4 (four) times daily. 10 mL 0  . PARoxetine (PAXIL) 20 MG tablet 1 and 1/2 tab by mouth per day 135 tablet 3  . traMADol (ULTRAM) 50 MG tablet TAKE 1 TABLET BY MOUTH EVERY 6 HOURS AS NEEDED FOR PAIN 120 tablet 2  . levothyroxine (SYNTHROID) 75 MCG tablet Take 1 tablet (75 mcg total) by mouth daily. 30 tablet 11   No current facility-administered medications on file prior to visit.   Review of Systems  Constitutional: Negative for unusual diaphoresis or night sweats HENT: Negative for ringing in ear or discharge Eyes: Negative for double vision or worsening visual disturbance.  Respiratory: Negative for choking and stridor.   Gastrointestinal: Negative for vomiting or other signifcant bowel change Genitourinary: Negative for hematuria or change in urine volume.  Musculoskeletal: Negative for other MSK pain or swelling Skin: Negative for color change and worsening wound.  Neurological: Negative for tremors and numbness other than noted  Psychiatric/Behavioral: Negative for decreased concentration or agitation other than above       Objective:   Physical Exam BP 130/82 mmHg  Pulse 106  Temp(Src) 98.9 F (37.2 C) (Oral)  Resp 20  Ht  (1.626 m)  Wt 220 lb (99.791 kg)  BMI 37.74 kg/m2  SpO2 90% VS noted, mild ill Constitutional: Pt appears in no significant distress HENT: Head: NCAT.  Right Ear: External ear  normal.  Left Ear: External ear normal.  Eyes: . Pupils are equal, round, and reactive to light. Conjunctivae and EOM are normal Left tm's with severe erythema.  Max sinus areas non tender.  Pharynx with mild erythema, no exudate Neck: Normal range of motion. Neck supple.  Cardiovascular: Normal rate and regular rhythm.   Pulmonary/Chest: Effort normal and breath sounds without rales or wheezing.  Abd:  Soft, NT, ND, + BS Neurological: Pt is alert. Not confused , motor grossly intact Skin: Skin is warm. No rash, no LE edema Psychiatric: Pt behavior is normal. No agitation.     Assessment & Plan:

## 2015-03-27 NOTE — Assessment & Plan Note (Signed)
Ok to incresae the tizanidine to  qhs prn,  to f/u any worsening symptoms or concerns

## 2015-03-28 MED ORDER — ASPIRIN EC 81 MG PO TBEC: 81.0000 mg | DELAYED_RELEASE_TABLET | Freq: Every day | ORAL | Status: DC

## 2015-03-28 NOTE — Assessment & Plan Note (Signed)
Asympt, due for doppler f/u, will order, cont asa

## 2015-03-28 NOTE — Assessment & Plan Note (Signed)
Mild to mod, for antibx course,  to f/u any worsening symptoms or concerns 

## 2015-03-28 NOTE — Assessment & Plan Note (Signed)
Mild worsening persistent, for re-start PPI daily

## 2015-04-03 ENCOUNTER — Other Ambulatory Visit: Payer: Self-pay | Admitting: Internal Medicine

## 2015-04-05 ENCOUNTER — Telehealth (HOSPITAL_COMMUNITY): Payer: Self-pay | Admitting: Internal Medicine

## 2015-04-24 ENCOUNTER — Other Ambulatory Visit: Payer: Self-pay | Admitting: Internal Medicine

## 2015-04-25 NOTE — Telephone Encounter (Signed)
Please advise, patient is requesting refill on medication  

## 2015-05-26 ENCOUNTER — Other Ambulatory Visit: Payer: Self-pay | Admitting: Family

## 2015-05-29 ENCOUNTER — Other Ambulatory Visit: Payer: Self-pay | Admitting: Family

## 2015-05-29 ENCOUNTER — Other Ambulatory Visit: Payer: Self-pay | Admitting: Internal Medicine

## 2015-05-29 NOTE — Telephone Encounter (Signed)
Done hardcopy to [name]  

## 2015-05-30 NOTE — Telephone Encounter (Signed)
Done hardcopy to [name]  

## 2015-05-30 NOTE — Telephone Encounter (Signed)
Medication has been sent to pharmacy, patient is aware

## 2015-06-23 ENCOUNTER — Other Ambulatory Visit: Payer: Self-pay | Admitting: Internal Medicine

## 2015-07-27 ENCOUNTER — Ambulatory Visit: Payer: Medicare Other | Admitting: Internal Medicine

## 2015-08-06 ENCOUNTER — Other Ambulatory Visit: Payer: Self-pay | Admitting: Internal Medicine

## 2015-08-07 ENCOUNTER — Encounter: Payer: Self-pay | Admitting: Internal Medicine

## 2015-08-07 ENCOUNTER — Ambulatory Visit (INDEPENDENT_AMBULATORY_CARE_PROVIDER_SITE_OTHER): Payer: Medicare Other | Admitting: Internal Medicine

## 2015-08-07 VITALS — BP 140/78 | HR 78 | Resp 20 | Wt 221.0 lb

## 2015-08-07 DIAGNOSIS — R29898 Other symptoms and signs involving the musculoskeletal system: Secondary | ICD-10-CM | POA: Diagnosis not present

## 2015-08-07 DIAGNOSIS — R7302 Impaired glucose tolerance (oral): Secondary | ICD-10-CM

## 2015-08-07 DIAGNOSIS — H6982 Other specified disorders of Eustachian tube, left ear: Secondary | ICD-10-CM | POA: Diagnosis not present

## 2015-08-07 DIAGNOSIS — J019 Acute sinusitis, unspecified: Secondary | ICD-10-CM | POA: Diagnosis not present

## 2015-08-07 DIAGNOSIS — H6980 Other specified disorders of Eustachian tube, unspecified ear: Secondary | ICD-10-CM | POA: Insufficient documentation

## 2015-08-07 DIAGNOSIS — I6523 Occlusion and stenosis of bilateral carotid arteries: Secondary | ICD-10-CM | POA: Diagnosis not present

## 2015-08-07 DIAGNOSIS — E785 Hyperlipidemia, unspecified: Secondary | ICD-10-CM

## 2015-08-07 DIAGNOSIS — I10 Essential (primary) hypertension: Secondary | ICD-10-CM

## 2015-08-07 MED ORDER — ESCITALOPRAM OXALATE 10 MG PO TABS: 10.0000 mg | ORAL_TABLET | Freq: Every day | ORAL | Status: DC

## 2015-08-07 MED ORDER — AZITHROMYCIN 250 MG PO TABS: ORAL_TABLET | ORAL | Status: DC

## 2015-08-07 NOTE — Progress Notes (Signed)
Pre visit review using our clinic review tool, if applicable. No additional management support is needed unless otherwise documented below in the visit note. 

## 2015-08-07 NOTE — Patient Instructions (Signed)
OK to stop the paxil  OK to start the lexapro 10 mg per day  Please take all new medication as prescribed  - the antibiotic  You can also take Mucinex (or it's generic off brand) for congestion, and tylenol as needed for pain.  Please continue all other medications as before, and refills have been done if requested.  Please have the pharmacy call with any other refills you may need.  Please continue your efforts at being more active, low cholesterol diet, and weight control.  Please keep your appointments with your specialists as you may have planned  You will be contacted regarding the referral for: neurology  Please go to the LAB in the Basement (turn left off the elevator) for the tests to be done today  You will be contacted by phone if any changes need to be made immediately.  Otherwise, you will receive a letter about your results with an explanation, but please check with MyChart first.  Please remember to sign up for MyChart if you have not done so, as this will be important to you in the future with finding out test results, communicating by private email, and scheduling acute appointments online when needed.  Please return in 6 months, or sooner if needed

## 2015-08-07 NOTE — Progress Notes (Signed)
Subjective:    Patient ID: Jenny Acosta, female    DOB: Aug 06, 1945, 70 y.o.   MRN: [id number]  HPI  Here to f/u; overall doing ok,  Here with 2-3 days acute onset fever, facial pain, pressure, headache, general weakness and malaise, and greenish d/c, with mild ST and cough, but pt denies chest pain, wheezing, increased sob or doe, orthopnea, PND, increased LE swelling, palpitations, dizziness or syncope.  Does also have some bilat ear popping and crackling, pressure.  Pt denies new neurological symptoms such as new headache, or facial or extremity numbness. But has several weeks wrosening symptoms that even taking showers leads to sob and unusual general weakness more pronounced to upper extremities per pt, so taking less showers.  Recalls 2 friends dx and tx for lyme dz years ago.  Finds UE strength just not adequate, very hard to do hair, cant leave arms overhead for very long, due to weakness, but little to no pain.    Pt denies polydipsia, polyuria, or low sugar episode.  .   Pt states overall good compliance with meds, mostly trying to follow appropriate diet, with wt overall stable, not very active . No fever, Denies urinary symptoms such as dysuria, frequency, urgency, flank pain, hematuria or n/v, fever, chills. Past Medical History  Diagnosis Date  . Pain, coccyx   . GERD (gastroesophageal reflux disease)   . Hypertension   . Hypothyroidism   . Gallstone   . Carotid stenosis   . Impaired glucose tolerance   . Allergic rhinitis, cause unspecified   . Right lumbar radiculopathy 08/30/2010  . Hyperlipidemia 08/30/2010  . Arthritis   . Family history of colon cancer 07/21/2013    mother   Past Surgical History  Procedure Laterality Date  . Cholecystectomy  2002  . Appendectomy  1953  . Tonsillectomy  1959  . Scarlet fever      age 70  . Cholecystectomy open  10 years ago    reports that she has never smoked. She does not have any smokeless tobacco history on file. She reports that  she does not drink alcohol or use illicit drugs. family history includes Arthritis in her other; Cancer in her other; Diabetes in her other and other; Hyperlipidemia in her other; Stroke in her other. Allergies  Allergen Reactions  . Lipitor [Atorvastatin]     myalgia  . Methocarbamol Other (See Comments)    hallucinations  . Simvastatin     Hair loss  . Sulfa Antibiotics     unknown   Current Outpatient Prescriptions on File Prior to Visit  Medication Sig Dispense Refill  . acetaminophen (TYLENOL) 500 MG tablet Take 500 mg by mouth every 6 (six) hours as needed. For pain    . aspirin EC 81 MG tablet Take 1 tablet (81 mg total) by mouth daily. 90 tablet 11  . levothyroxine (SYNTHROID, LEVOTHROID) 75 MCG tablet TAKE 1 TABLET (75 MCG TOTAL) BY MOUTH DAILY. 30 tablet 10  . levothyroxine (SYNTHROID, LEVOTHROID) 75 MCG tablet TAKE 1 TABLET BY MOUTH EVERY DAY 90 tablet 3  . meclizine (ANTIVERT) 25 MG tablet TAKE 1 TABLET BY MOUTH 4 TIMES A DAY 120 tablet 0  . neomycin-colistin-hydrocortisone-thonzonium (CORTISPORIN-TC) 3.04-26-08-0.5 MG/ML otic suspension Place 3 drops into the right ear 4 (four) times daily. 10 mL 0  . pantoprazole (PROTONIX) 40 MG tablet Take 1 tablet (40 mg total) by mouth daily. 90 tablet 3  . tiZANidine (ZANAFLEX) 4 MG tablet TAKE 1 TABLET (4 MG  TOTAL) BY MOUTH 3 (THREE) TIMES DAILY AS NEEDED FOR MUSCLE SPASMS. 90 tablet 2  . levothyroxine (SYNTHROID) 75 MCG tablet Take 1 tablet (75 mcg total) by mouth daily. 30 tablet 11   No current facility-administered medications on file prior to visit.   Review of Systems  Constitutional: Negative for unusual diaphoresis or night sweats HENT: Negative for ear swelling or discharge Eyes: Negative for worsening visual haziness  Respiratory: Negative for choking and stridor.   Gastrointestinal: Negative for distension or worsening eructation Genitourinary: Negative for retention or change in urine volume.  Musculoskeletal: Negative  for other MSK pain or swelling Skin: Negative for color change and worsening wound Neurological: Negative for tremors and numbness other than noted  Psychiatric/Behavioral: Negative for decreased concentration or agitation other than above       Objective:   Physical Exam BP 140/78 mmHg  Pulse 78  Resp 20  Wt 221 lb (100.245 kg)  SpO2 96% VS noted, non toxic, obese but mild ill Constitutional: Pt appears in no apparent distress HENT: Head: NCAT.  Right Ear: External ear normal.  Left Ear: External ear normal.  Bilat tm's with mild erythema.  Max sinus areas mild tender.  Pharynx with mild erythema, no exudate Eyes: . Pupils are equal, round, and reactive to light. Conjunctivae and EOM are normal Neck: Normal range of motion. Neck supple.  Cardiovascular: Normal rate and regular rhythm.   Pulmonary/Chest: Effort normal and breath sounds without rales or wheezing.  Abd:  Soft, NT, ND, + BS Neurological: Pt is alert. Not confused , motor grossly intact but with general weakness somewhat  worse to UEs Skin: Skin is warm. No rash, no LE edema Psychiatric: Pt behavior is normal. No agitation. mild nervous    Assessment & Plan:

## 2015-08-07 NOTE — Telephone Encounter (Signed)
Tramadol too soon, just given 3 mo refills on April 5

## 2015-08-08 ENCOUNTER — Other Ambulatory Visit: Payer: Self-pay | Admitting: Internal Medicine

## 2015-08-08 NOTE — Telephone Encounter (Signed)
Please advise 

## 2015-08-08 NOTE — Telephone Encounter (Signed)
Done hardcopy to [name]  

## 2015-08-09 NOTE — Telephone Encounter (Signed)
Medication refill sent to pharmacy  

## 2015-08-12 ENCOUNTER — Encounter: Payer: Self-pay | Admitting: Internal Medicine

## 2015-08-13 NOTE — Assessment & Plan Note (Signed)
stable overall by history and exam, recent data reviewed with pt, and pt to continue medical treatment as before,  to f/u any worsening symptoms or concerns BP Readings from Last 3 Encounters:  08/07/15 140/78  03/27/15 130/82  10/24/14 130/84

## 2015-08-13 NOTE — Assessment & Plan Note (Signed)
Asympt, stable overall by history and exam, recent data reviewed with pt, and pt to continue medical treatment as before,  to f/u any worsening symptoms or concerns Lab Results  Component Value Date   HGBA1C 5.5 10/24/2014

## 2015-08-13 NOTE — Assessment & Plan Note (Addendum)
stable overall by history and exam, recent data reviewed with pt, and pt to continue medical treatment as before,  to f/u any worsening symptoms or concerns, has been statin intolerant in past, for lower chol diet Lab Results  Component Value Date   LDLCALC 193* 07/21/2013

## 2015-08-13 NOTE — Assessment & Plan Note (Signed)
For mucinex otc prn,  to f/u any worsening symptoms or concerns  

## 2015-08-13 NOTE — Assessment & Plan Note (Signed)
Mild to mod, for antibx course,  to f/u any worsening symptoms or concerns 

## 2015-08-13 NOTE — Assessment & Plan Note (Signed)
?   Clinical significance, etiology unclear, ok for neurology referral,  to f/u any worsening symptoms or concerns

## 2015-08-13 NOTE — Telephone Encounter (Signed)
[facility] Per chart rx was faxed back on 6/15, she states they have not receive. Gave authorization verbally...Raechel Chute/lmb

## 2015-08-15 NOTE — Telephone Encounter (Signed)
[name] -   Please make sure the tramadol rx for Ms Jenny Acosta is ready for picup, or let me know if needs to be done again

## 2015-08-16 MED ORDER — TRAMADOL HCL 50 MG PO TABS: 50.0000 mg | ORAL_TABLET | Freq: Four times a day (QID) | ORAL | Status: DC | PRN

## 2015-08-16 NOTE — Telephone Encounter (Signed)
Done hardcopy to [name]  To fax to correct pharmacy, thanks

## 2015-08-29 ENCOUNTER — Encounter: Payer: Self-pay | Admitting: Internal Medicine

## 2015-09-11 ENCOUNTER — Encounter: Payer: Self-pay | Admitting: Internal Medicine

## 2015-09-11 ENCOUNTER — Ambulatory Visit (INDEPENDENT_AMBULATORY_CARE_PROVIDER_SITE_OTHER): Payer: Medicare Other | Admitting: Internal Medicine

## 2015-09-11 VITALS — BP 130/70 | HR 109 | Temp 98.8°F | Resp 20 | Wt 225.0 lb

## 2015-09-11 DIAGNOSIS — I6523 Occlusion and stenosis of bilateral carotid arteries: Secondary | ICD-10-CM

## 2015-09-11 DIAGNOSIS — I10 Essential (primary) hypertension: Secondary | ICD-10-CM

## 2015-09-11 DIAGNOSIS — J309 Allergic rhinitis, unspecified: Secondary | ICD-10-CM

## 2015-09-11 DIAGNOSIS — R7302 Impaired glucose tolerance (oral): Secondary | ICD-10-CM

## 2015-09-11 DIAGNOSIS — J019 Acute sinusitis, unspecified: Secondary | ICD-10-CM | POA: Diagnosis not present

## 2015-09-11 DIAGNOSIS — R51 Headache: Secondary | ICD-10-CM

## 2015-09-11 DIAGNOSIS — R519 Headache, unspecified: Secondary | ICD-10-CM

## 2015-09-11 MED ORDER — LEVOFLOXACIN 500 MG PO TABS: 500.0000 mg | ORAL_TABLET | Freq: Every day | ORAL | Status: DC

## 2015-09-11 MED ORDER — METHYLPREDNISOLONE ACETATE 40 MG/ML IJ SUSP
40.0000 mg | Freq: Once | INTRAMUSCULAR | Status: AC
  Administered 2015-09-11: 40 mg via INTRAMUSCULAR

## 2015-09-11 MED ORDER — PREDNISONE 10 MG PO TABS: ORAL_TABLET | ORAL | Status: DC

## 2015-09-11 NOTE — Patient Instructions (Signed)
You had the pain shot, and the steroid shot today  Please take all new medication as prescribed - the antibiotic and prednisone  Please continue all other medications as before, including the mucinex  Please have the pharmacy call with any other refills you may need.  Please keep your appointments with your specialists as you may have planned

## 2015-09-11 NOTE — Progress Notes (Signed)
Pre visit review using our clinic review tool, if applicable. No additional management support is needed unless otherwise documented below in the visit note. 

## 2015-09-12 DIAGNOSIS — J309 Allergic rhinitis, unspecified: Secondary | ICD-10-CM | POA: Diagnosis not present

## 2015-09-12 DIAGNOSIS — R51 Headache: Secondary | ICD-10-CM | POA: Diagnosis not present

## 2015-09-12 MED ORDER — METHYLPREDNISOLONE ACETATE 40 MG/ML IJ SUSP
40.0000 mg | Freq: Once | INTRAMUSCULAR | Status: AC
  Administered 2015-09-12: 40 mg via INTRAMUSCULAR

## 2015-09-12 MED ORDER — KETOROLAC TROMETHAMINE 30 MG/ML IJ SOLN
30.0000 mg | Freq: Once | INTRAMUSCULAR | Status: AC
  Administered 2015-09-12: 30 mg via INTRAMUSCULAR

## 2015-09-12 MED ORDER — METHYLPREDNISOLONE ACETATE 40 MG/ML IJ SUSP: 40.0000 mg | Freq: Once | INTRAMUSCULAR | Status: DC

## 2015-09-12 NOTE — Progress Notes (Signed)
Subjective:    Patient ID: Jenny Acosta, female    DOB: 09/17/1945, 70 y.o.   MRN: [id number]  HPI   Here with 2-3 days acute onset fever, HA, facial pain, pressure, headache, general weakness and malaise, and greenish d/c, with mild ST and cough, but pt denies chest pain, wheezing, increased sob or doe, orthopnea, PND, increased LE swelling, palpitations, dizziness or syncope. Does have several wks ongoing nasal allergy symptoms with clearish congestion, itch and sneezing, without fever, pain, ST, cough, swelling or wheezing.  Has also had migraine type HA start yesterday with ongoing home stressors, with pounding, throbbing to bilat forehead, without blurry vision, n/v or photophobia. Past Medical History  Diagnosis Date  . Pain, coccyx   . GERD (gastroesophageal reflux disease)   . Hypertension   . Hypothyroidism   . Gallstone   . Carotid stenosis   . Impaired glucose tolerance   . Allergic rhinitis, cause unspecified   . Right lumbar radiculopathy 08/30/2010  . Hyperlipidemia 08/30/2010  . Arthritis   . Family history of colon cancer 07/21/2013    mother   Past Surgical History  Procedure Laterality Date  . Cholecystectomy  2002  . Appendectomy  1953  . Tonsillectomy  1959  . Scarlet fever      age 70  . Cholecystectomy open  10 years ago    reports that she has never smoked. She does not have any smokeless tobacco history on file. She reports that she does not drink alcohol or use illicit drugs. family history includes Arthritis in her other; Cancer in her other; Diabetes in her other and other; Hyperlipidemia in her other; Stroke in her other. Allergies  Allergen Reactions  . Lipitor [Atorvastatin]     myalgia  . Methocarbamol Other (See Comments)    hallucinations  . Simvastatin     Hair loss  . Sulfa Antibiotics     unknown   Current Outpatient Prescriptions on File Prior to Visit  Medication Sig Dispense Refill  . acetaminophen (TYLENOL) 500 MG tablet Take 500 mg  by mouth every 6 (six) hours as needed. For pain    . aspirin EC 81 MG tablet Take 1 tablet (81 mg total) by mouth daily. 90 tablet 11  . azithromycin (ZITHROMAX Z-PAK) 250 MG tablet Use as directed 6 tablet 1  . escitalopram (LEXAPRO) 10 MG tablet Take 1 tablet (10 mg total) by mouth daily. 90 tablet 3  . levothyroxine (SYNTHROID, LEVOTHROID) 75 MCG tablet TAKE 1 TABLET (75 MCG TOTAL) BY MOUTH DAILY. 30 tablet 10  . levothyroxine (SYNTHROID, LEVOTHROID) 75 MCG tablet TAKE 1 TABLET BY MOUTH EVERY DAY 90 tablet 3  . meclizine (ANTIVERT) 25 MG tablet TAKE 1 TABLET BY MOUTH 4 TIMES A DAY 120 tablet 0  . neomycin-colistin-hydrocortisone-thonzonium (CORTISPORIN-TC) 3.04-26-08-0.5 MG/ML otic suspension Place 3 drops into the right ear 4 (four) times daily. 10 mL 0  . pantoprazole (PROTONIX) 40 MG tablet Take 1 tablet (40 mg total) by mouth daily. 90 tablet 3  . tiZANidine (ZANAFLEX) 4 MG tablet TAKE 1 TABLET (4 MG TOTAL) BY MOUTH 3 (THREE) TIMES DAILY AS NEEDED FOR MUSCLE SPASMS. 90 tablet 2  . traMADol (ULTRAM) 50 MG tablet Take 1 tablet (50 mg total) by mouth every 6 (six) hours as needed. for pain 120 tablet 5  . levothyroxine (SYNTHROID) 75 MCG tablet Take 1 tablet (75 mcg total) by mouth daily. 30 tablet 11   No current facility-administered medications on file prior to visit.  Review of Systems  Constitutional: Negative for unusual diaphoresis or night sweats HENT: Negative for ear swelling or discharge Eyes: Negative for worsening visual haziness  Respiratory: Negative for choking and stridor.   Gastrointestinal: Negative for distension or worsening eructation Genitourinary: Negative for retention or change in urine volume.  Musculoskeletal: Negative for other MSK pain or swelling Skin: Negative for color change and worsening wound Neurological: Negative for tremors and numbness other than noted  Psychiatric/Behavioral: Negative for decreased concentration or agitation other than above        Objective:   Physical Exam BP 130/70 mmHg  Pulse 109  Temp(Src) 98.8 F (37.1 C) (Oral)  Resp 20  Wt 225 lb (102.059 kg)  SpO2 97% VS noted, mild ill Constitutional: Pt appears in no apparent distress HENT: Head: NCAT.  Right Ear: External ear normal.  Left Ear: External ear normal.  Bilat tm's with mild erythema.  Max sinus areas mild tender.  Pharynx with mild erythema, no exudate Eyes: . Pupils are equal, round, and reactive to light. Conjunctivae and EOM are normal Neck: Normal range of motion. Neck supple.  Cardiovascular: Normal rate and regular rhythm.   Pulmonary/Chest: Effort normal and breath sounds without rales or wheezing.  Neurological: Pt is alert. Not confused , motor grossly intact, cn 2-12 intact Skin: Skin is warm. No rash, no LE edema Psychiatric: Pt behavior is normal. No agitation.     Assessment & Plan:

## 2015-09-12 NOTE — Assessment & Plan Note (Signed)
Mild to mod, for depomedrol and predpac asd for seasonal flare,,  to f/u any worsening symptoms or concerns

## 2015-09-12 NOTE — Assessment & Plan Note (Signed)
stable overall by history and exam, recent data reviewed with pt, and pt to continue medical treatment as before,  to f/u any worsening symptoms or concerns BP Readings from Last 3 Encounters:  09/11/15 130/70  08/07/15 140/78  03/27/15 130/82

## 2015-09-12 NOTE — Assessment & Plan Note (Signed)
stable overall by history and exam, recent data reviewed with pt, and pt to continue medical treatment as before,  to f/u any worsening symptoms or concerns Lab Results  Component Value Date   HGBA1C 5.5 10/24/2014

## 2015-09-12 NOTE — Assessment & Plan Note (Signed)
Mild to mod, for antibx course,  to f/u any worsening symptoms or concerns 

## 2015-09-12 NOTE — Assessment & Plan Note (Signed)
?   Migraine vs siusitis related, for toradol IM today, d/w pt - I would not feel comfortable with imitrex use at her age and CV risk

## 2015-09-12 NOTE — Addendum Note (Signed)
Addended by: Etheleen MayhewOX, HEATHER C on: 09/12/2015 11:29 AM   Modules accepted: Orders

## 2015-09-17 ENCOUNTER — Telehealth: Payer: Self-pay | Admitting: Emergency Medicine

## 2015-09-17 NOTE — Telephone Encounter (Signed)
Pt needs a prescription for nausea. The antibiotic is making her sick. She is also still having migraines and is having trouble getting out of bed. She cant get in to see the neurologist until 10/18/15. Pharmacy is [facility]. Please follow up thanks.

## 2015-09-18 MED ORDER — ONDANSETRON HCL 4 MG PO TABS: 4.0000 mg | ORAL_TABLET | Freq: Three times a day (TID) | ORAL | 1 refills | Status: DC | PRN

## 2015-09-18 NOTE — Telephone Encounter (Signed)
zofran done erx 

## 2015-10-31 ENCOUNTER — Telehealth: Payer: Self-pay | Admitting: Emergency Medicine

## 2015-10-31 DIAGNOSIS — R109 Unspecified abdominal pain: Secondary | ICD-10-CM

## 2015-10-31 MED ORDER — PAROXETINE HCL 20 MG PO TABS: 20.0000 mg | ORAL_TABLET | Freq: Every day | ORAL | 3 refills | Status: DC

## 2015-10-31 NOTE — Telephone Encounter (Signed)
Referral done  lexapro changed to paxil 20

## 2015-10-31 NOTE — Telephone Encounter (Signed)
Pt called and stated she has had more stomach attacks. She wants to know if you can send her a referral to gastro or would you like her to make an appt to be seen? She would also like a prescription refill on PARoxetine (PAXIL) 20 MG tablet. She prefers that over Lexapro. Please advise thanks.

## 2015-11-02 ENCOUNTER — Other Ambulatory Visit: Payer: Self-pay | Admitting: Internal Medicine

## 2015-11-03 ENCOUNTER — Other Ambulatory Visit: Payer: Self-pay | Admitting: Internal Medicine

## 2015-11-05 NOTE — Telephone Encounter (Signed)
Rec'd fax stating a 90 day can be filled pt is requesting to change to 90 day supply...Raechel Chute/lmb

## 2015-11-09 ENCOUNTER — Ambulatory Visit: Payer: Medicare Other | Admitting: Neurology

## 2015-11-14 ENCOUNTER — Encounter: Payer: Self-pay | Admitting: Gastroenterology

## 2015-11-15 ENCOUNTER — Telehealth: Payer: Self-pay | Admitting: Gastroenterology

## 2015-11-15 NOTE — Telephone Encounter (Signed)
Left message for patient to call back  

## 2015-11-16 NOTE — Telephone Encounter (Signed)
I left a message for the patient to call back and ok to scheduled with APP in pm

## 2015-11-19 NOTE — Telephone Encounter (Signed)
Left message for patient to call back to schedule with APP if she is interested.

## 2015-11-28 ENCOUNTER — Ambulatory Visit: Payer: Medicare Other | Admitting: Physician Assistant

## 2015-11-30 ENCOUNTER — Other Ambulatory Visit: Payer: Self-pay | Admitting: Internal Medicine

## 2015-12-11 ENCOUNTER — Ambulatory Visit: Payer: Medicare Other | Admitting: Physician Assistant

## 2016-01-21 ENCOUNTER — Ambulatory Visit: Payer: Medicare Other | Admitting: Gastroenterology

## 2016-01-28 ENCOUNTER — Telehealth: Payer: Self-pay | Admitting: *Deleted

## 2016-01-28 NOTE — Telephone Encounter (Signed)
Left msg on triage requesting refill on her Tramadol.../lmb 

## 2016-01-29 MED ORDER — TRAMADOL HCL 50 MG PO TABS: 50.0000 mg | ORAL_TABLET | Freq: Four times a day (QID) | ORAL | 5 refills | Status: DC | PRN

## 2016-01-29 NOTE — Telephone Encounter (Signed)
Ok, but not due to at least dec 15  Done hardcopy to [employer] CorporationCorinne

## 2016-01-29 NOTE — Telephone Encounter (Signed)
faxed

## 2016-01-31 ENCOUNTER — Other Ambulatory Visit: Payer: Self-pay

## 2016-02-12 ENCOUNTER — Ambulatory Visit: Payer: Medicare Other | Admitting: Internal Medicine

## 2016-02-25 ENCOUNTER — Other Ambulatory Visit: Payer: Self-pay | Admitting: Internal Medicine

## 2016-03-03 ENCOUNTER — Telehealth: Payer: Self-pay | Admitting: Internal Medicine

## 2016-03-03 NOTE — Telephone Encounter (Signed)
Called patient to schedule awv. Left msg for patient to call office to schedule appt.  °

## 2016-03-27 ENCOUNTER — Other Ambulatory Visit: Payer: Self-pay | Admitting: Internal Medicine

## 2016-04-01 ENCOUNTER — Encounter: Payer: Self-pay | Admitting: Internal Medicine

## 2016-04-28 ENCOUNTER — Other Ambulatory Visit: Payer: Self-pay | Admitting: Internal Medicine

## 2016-04-29 ENCOUNTER — Encounter: Payer: Self-pay | Admitting: Nurse Practitioner

## 2016-05-05 ENCOUNTER — Other Ambulatory Visit: Payer: Self-pay | Admitting: Internal Medicine

## 2016-05-08 ENCOUNTER — Ambulatory Visit (INDEPENDENT_AMBULATORY_CARE_PROVIDER_SITE_OTHER): Payer: Medicare HMO | Admitting: Nurse Practitioner

## 2016-05-08 ENCOUNTER — Encounter: Payer: Self-pay | Admitting: Nurse Practitioner

## 2016-05-08 VITALS — BP 128/74 | HR 80 | Ht 64.0 in | Wt 228.0 lb

## 2016-05-08 DIAGNOSIS — R1011 Right upper quadrant pain: Secondary | ICD-10-CM

## 2016-05-08 DIAGNOSIS — R110 Nausea: Secondary | ICD-10-CM | POA: Diagnosis not present

## 2016-05-08 DIAGNOSIS — K219 Gastro-esophageal reflux disease without esophagitis: Secondary | ICD-10-CM | POA: Diagnosis not present

## 2016-05-08 DIAGNOSIS — K5900 Constipation, unspecified: Secondary | ICD-10-CM

## 2016-05-08 MED ORDER — RANITIDINE HCL 150 MG PO TABS: 150.0000 mg | ORAL_TABLET | Freq: Every day | ORAL | 3 refills | Status: DC

## 2016-05-08 MED ORDER — PLECANATIDE 3 MG PO TABS: 1.0000 | ORAL_TABLET | Freq: Every day | ORAL | 3 refills | Status: DC

## 2016-05-08 NOTE — Progress Notes (Addendum)
HPI:  Patient is a 71 yo female referred by PCP Dr. Oliver BarreJames John for nausea and abdominal pain. Symptoms started over a year ago. Had an appt here in October but too sick to get here. Symptoms started with an "attack" of RUQ cramping and vomiting of bilious material as well as constipation. Symptoms resolved but started back a few months ago. At times the RUQ pain can radiate into RLQ and cause a stabbing sensation. The pain occurs postprandially though she can't relate it to many specific foods. Taking Zofran on a regular basis, kept her from vomiting for last 2 months. No NSAIDs in 4 years. She also has a history of reflux. Was taking a PPI but stopped it after reading about side effectts. Takes Tums and baking soda as needed.  No dysphagia. She sometimes coughs when swallowing certain foods with sharp edges. She complains of bloating and constipation. Taking probiotics. Bought a powder online for constipation but couldn't "stomach it". Takes mg+ citrate sometimes and that works.  She has fibromyalgia and attributes a lot of her symptoms to that.     Past Medical History:  Diagnosis Date  . Allergic rhinitis, cause unspecified   . Arthritis   . Carotid stenosis   . Family history of colon cancer 07/21/2013   mother  . Gallstone   . GERD (gastroesophageal reflux disease)   . Hyperlipidemia 08/30/2010  . Hypertension   . Hypothyroidism   . Impaired glucose tolerance   . Pain, coccyx   . Right lumbar radiculopathy 08/30/2010     Past Surgical History:  Procedure Laterality Date  . APPENDECTOMY  1953  . CHOLECYSTECTOMY  2002  . CHOLECYSTECTOMY OPEN  10 years ago  . scarlet fever     age 71  . TONSILLECTOMY  1959   Family History  Problem Relation Age of Onset  . Colon cancer Mother   . Stroke Mother   . Arthritis Sister   . Atrial fibrillation Sister     has pacemaker  . Diabetes Maternal Grandmother   . Diabetes Daughter   . Stomach cancer Neg Hx    Social History    Substance Use Topics  . Smoking status: Never Smoker  . Smokeless tobacco: Never Used  . Alcohol use No   Current Outpatient Prescriptions  Medication Sig Dispense Refill  . acetaminophen (TYLENOL) 500 MG tablet Take 500 mg by mouth every 6 (six) hours as needed. For pain    . aspirin EC 81 MG tablet Take 1 tablet (81 mg total) by mouth daily. 90 tablet 11  . levothyroxine (SYNTHROID) 75 MCG tablet Take 1 tablet (75 mcg total) by mouth daily. 30 tablet 11  . levothyroxine (SYNTHROID, LEVOTHROID) 75 MCG tablet TAKE 1 TABLET (75 MCG TOTAL) BY MOUTH DAILY. 30 tablet 10  . levothyroxine (SYNTHROID, LEVOTHROID) 75 MCG tablet TAKE 1 TABLET BY MOUTH EVERY DAY 90 tablet 3  . neomycin-colistin-hydrocortisone-thonzonium (CORTISPORIN-TC) 3.04-26-08-0.5 MG/ML otic suspension Place 3 drops into the right ear 4 (four) times daily. 10 mL 0  . ondansetron (ZOFRAN) 4 MG tablet TAKE 1 TABLET (4 MG TOTAL) BY MOUTH EVERY 8 HOURS AS NEEDED FOR NAUSEA OR VOMITING. 30 tablet 0  . PARoxetine (PAXIL) 20 MG tablet Take 1 tablet (20 mg total) by mouth daily. 90 tablet 3  . predniSONE (DELTASONE) 10 MG tablet 3 tabs by mouth per day for 3 days,2tabs per day for 3 days,1tab per day for 3 days 18 tablet 0  .  tiZANidine (ZANAFLEX) 4 MG tablet TAKE 1 TABLET (4 MG TOTAL) BY MOUTH 3 (THREE) TIMES DAILY AS NEEDED FOR MUSCLE SPASMS. 270 tablet 0  . traMADol (ULTRAM) 50 MG tablet Take 1 tablet (50 mg total) by mouth every 6 (six) hours as needed. To fill Feb 08, 2016 for pain 120 tablet 5   No current facility-administered medications for this visit.    Allergies  Allergen Reactions  . Lipitor [Atorvastatin]     myalgia  . Methocarbamol Other (See Comments)    hallucinations  . Simvastatin     Hair loss  . Sulfa Antibiotics     unknown     Review of Systems: All systems reviewed and negative except where noted in HPI.    Physical Exam: BP 128/74   Pulse 80   Ht 5\' 4"  (1.626 m)   Wt 228 lb (103.4 kg)   BMI  39.14 kg/m  Constitutional:  Obese white female in no acute distress. Psychiatric: Normal mood and affect. Behavior is normal. HEENT: Normocephalic and atraumatic. Conjunctivae are normal. No scleral icterus. Neck supple.  Cardiovascular: Normal rate, regular rhythm.  Pulmonary/chest: Effort normal and breath sounds normal. No wheezing, rales or rhonchi. Abdominal: Soft, nondistended, nontender. Bowel sounds active throughout. There are no masses palpable. No hepatomegaly. Extremities: no edema Lymphadenopathy: No cervical adenopathy noted. Neurological: Alert and oriented to person place and time. Skin: Skin is warm and dry. No rashes noted.   ASSESSMENT AND PLAN:   1. 71 yo female with multiple GI issues including chronic nausea, intermittent postprandial right sided abdominal pain, and daily GERD symptoms .  She is s/p remote cholecystectomy. Told she had fatty liver disease at time of cholecystectomy though no reported on u/s in 2013.  -Taking tums and baking soda, she doesn't want to take a PPI. Having daily heartburn so needs prevention treatment.. Trial of Zantac qam -CMET, CBC -We discussed EGD for evaluation of chronic nausea but she wants to hold off for now. She has several GI issues, will take some time to sort out anyway. She will follow up with me in 3 weeks.  -consider scheduling u/s at next visit to follow up on reported fatty liver disease  2. Colon cancer screening. She has a cologard at home, still debating on whether to pursue colonoscopy instead. She understands that a colonoscopy will be recommended if Cologard positive. She wants to discuss more about this at her follow up appt in a few weeks.   3. Chronic constipation described as decreased frequency and difficult passage. Hard time tolerating powders mixed in fluid (e.g. Miralax).  -trial of Truelance   4. Fibromyalgia. Takes Tramadol.   Willette Cluster, NP  05/08/2016, 2:56 PM  Cc: [name],  MD  Addendum: Reviewed and agree with initial management. Beverley Fiedler, MD

## 2016-05-08 NOTE — Patient Instructions (Signed)
Go to the basement for labs today  Your follow up appointment has been scheduled on 05/29/2016 at 3pm  We will send Zantac and Trulance to your pharmacy

## 2016-05-28 ENCOUNTER — Other Ambulatory Visit: Payer: Self-pay | Admitting: Internal Medicine

## 2016-05-29 ENCOUNTER — Ambulatory Visit: Payer: Medicare HMO | Admitting: Nurse Practitioner

## 2016-06-24 ENCOUNTER — Ambulatory Visit: Payer: Medicare HMO | Admitting: Internal Medicine

## 2016-07-02 ENCOUNTER — Other Ambulatory Visit (INDEPENDENT_AMBULATORY_CARE_PROVIDER_SITE_OTHER): Payer: Medicare HMO

## 2016-07-02 DIAGNOSIS — R7302 Impaired glucose tolerance (oral): Secondary | ICD-10-CM | POA: Diagnosis not present

## 2016-07-02 DIAGNOSIS — E784 Other hyperlipidemia: Secondary | ICD-10-CM

## 2016-07-02 DIAGNOSIS — I10 Essential (primary) hypertension: Secondary | ICD-10-CM

## 2016-07-02 DIAGNOSIS — M6281 Muscle weakness (generalized): Secondary | ICD-10-CM

## 2016-07-02 DIAGNOSIS — R7309 Other abnormal glucose: Secondary | ICD-10-CM

## 2016-07-02 DIAGNOSIS — E7849 Other hyperlipidemia: Secondary | ICD-10-CM

## 2016-07-02 DIAGNOSIS — H6980 Other specified disorders of Eustachian tube, unspecified ear: Secondary | ICD-10-CM

## 2016-07-03 ENCOUNTER — Telehealth: Payer: Self-pay

## 2016-07-03 ENCOUNTER — Telehealth: Payer: Self-pay | Admitting: Internal Medicine

## 2016-07-03 LAB — BASIC METABOLIC PANEL
BUN: 9 mg/dL (ref 6–23)
CO2: 23 mEq/L (ref 19–32)
Calcium: 9.4 mg/dL (ref 8.4–10.5)
Chloride: 104 mEq/L (ref 96–112)
Creatinine, Ser: 0.81 mg/dL (ref 0.40–1.20)
GFR: 74.02 mL/min (ref >60.00)
Glucose, Bld: 109 mg/dL — ABNORMAL HIGH (ref 70–99)
Potassium: 4 mEq/L (ref 3.5–5.1)
Sodium: 139 mEq/L (ref 135–145)

## 2016-07-03 LAB — LDL CHOLESTEROL, DIRECT: Direct LDL: 188 mg/dL

## 2016-07-03 LAB — LIPID PANEL
Cholesterol: 291 mg/dL — ABNORMAL HIGH (ref 0–200)
HDL: 54.5 mg/dL (ref >39.00)
NonHDL: 236.64
Total CHOL/HDL Ratio: 5
Triglycerides: 251 mg/dL — ABNORMAL HIGH (ref 0.0–149.0)
VLDL: 50.2 mg/dL — ABNORMAL HIGH (ref 0.0–40.0)

## 2016-07-03 LAB — URINALYSIS, ROUTINE W REFLEX MICROSCOPIC
Nitrite: NEGATIVE
Specific Gravity, Urine: >=1.03 — AB (ref 1.000–1.030)
Total Protein, Urine: 100 — AB
Urine Glucose: NEGATIVE
Urobilinogen, UA: 0.2 (ref 0.0–1.0)
pH: 6.5 (ref 5.0–8.0)

## 2016-07-03 LAB — HEPATIC FUNCTION PANEL
ALT: 18 U/L (ref 0–35)
AST: 17 U/L (ref 0–37)
Albumin: 4.3 g/dL (ref 3.5–5.2)
Alkaline Phosphatase: 105 U/L (ref 39–117)
Bilirubin, Direct: 0.1 mg/dL (ref 0.0–0.3)
Total Bilirubin: 0.4 mg/dL (ref 0.2–1.2)
Total Protein: 7.6 g/dL (ref 6.0–8.3)

## 2016-07-03 LAB — CBC WITH DIFFERENTIAL/PLATELET
Basophils Absolute: 0.1 10*3/uL (ref 0.0–0.1)
Basophils Relative: 1 % (ref 0.0–3.0)
Eosinophils Absolute: 0.1 10*3/uL (ref 0.0–0.7)
Eosinophils Relative: 1.3 % (ref 0.0–5.0)
HCT: 43.4 % (ref 36.0–46.0)
Hemoglobin: 14.5 g/dL (ref 12.0–15.0)
Lymphocytes Relative: 41.2 % (ref 12.0–46.0)
Lymphs Abs: 3.5 10*3/uL (ref 0.7–4.0)
MCHC: 33.3 g/dL (ref 30.0–36.0)
MCV: 85.5 fl (ref 78.0–100.0)
Monocytes Absolute: 0.6 10*3/uL (ref 0.1–1.0)
Monocytes Relative: 6.9 % (ref 3.0–12.0)
Neutro Abs: 4.2 10*3/uL (ref 1.4–7.7)
Neutrophils Relative %: 49.6 % (ref 43.0–77.0)
Platelets: 277 10*3/uL (ref 150.0–400.0)
RBC: 5.08 Mil/uL (ref 3.87–5.11)
RDW: 15.7 % — ABNORMAL HIGH (ref 11.5–15.5)
WBC: 8.4 10*3/uL (ref 4.0–10.5)

## 2016-07-03 LAB — TSH: TSH: 2.97 u[IU]/mL (ref 0.35–4.50)

## 2016-07-03 LAB — LYME AB/WESTERN BLOT REFLEX: B burgdorferi Ab IgG+IgM: <0.9 Index (ref <0.90)

## 2016-07-03 LAB — HEMOGLOBIN A1C: Hgb A1c MFr Bld: 5.8 % (ref 4.6–6.5)

## 2016-07-03 MED ORDER — CEPHALEXIN 500 MG PO CAPS: 500.0000 mg | ORAL_CAPSULE | Freq: Three times a day (TID) | ORAL | 0 refills | Status: AC

## 2016-07-03 NOTE — Telephone Encounter (Signed)
Now makes sense  OK for cephalexin course - [facility]

## 2016-07-03 NOTE — Telephone Encounter (Signed)
Pt called back for results from labs please call back.

## 2016-07-03 NOTE — Addendum Note (Signed)
Addended by: Corwin LevinsJOHN, Alaira Level W on: 07/03/2016 05:06 PM   Modules accepted: Orders

## 2016-07-03 NOTE — Telephone Encounter (Signed)
called pt back, no answer, left VM.

## 2016-07-03 NOTE — Telephone Encounter (Signed)
-----   Message from Corwin LevinsJames W John, MD sent at 07/03/2016 12:50 PM EDT ----- I dont see how this test was ordered and by whom, but please call pt - ? Is she having UTI symptoms such as pain, fever, frequency or blood?

## 2016-07-03 NOTE — Telephone Encounter (Signed)
Called pt, LVM.   

## 2016-07-03 NOTE — Telephone Encounter (Addendum)
She called back in. Pt stated that she went [facility] to have them done and stated that you had ordered them some time ago but she never went to have the lab drawn.  She has been having pain with urination, frequency and some burning. She would like something called in to her pharmacy but she doesn't want azithromycin because it upsets her stomach.

## 2016-07-09 ENCOUNTER — Ambulatory Visit: Payer: Medicare HMO | Admitting: Internal Medicine

## 2016-08-10 ENCOUNTER — Other Ambulatory Visit: Payer: Self-pay | Admitting: Internal Medicine

## 2016-08-12 NOTE — Telephone Encounter (Signed)
faxed

## 2016-08-12 NOTE — Telephone Encounter (Signed)
Done hardcopy to Shirron  

## 2016-09-11 ENCOUNTER — Other Ambulatory Visit: Payer: Self-pay | Admitting: Internal Medicine

## 2016-10-21 ENCOUNTER — Other Ambulatory Visit: Payer: Self-pay | Admitting: Internal Medicine

## 2016-10-29 ENCOUNTER — Other Ambulatory Visit: Payer: Self-pay | Admitting: Internal Medicine

## 2016-11-05 ENCOUNTER — Other Ambulatory Visit: Payer: Self-pay | Admitting: Internal Medicine

## 2016-11-13 ENCOUNTER — Encounter: Payer: Self-pay | Admitting: Internal Medicine

## 2016-12-02 ENCOUNTER — Ambulatory Visit: Payer: Medicare HMO | Admitting: Internal Medicine

## 2016-12-02 ENCOUNTER — Other Ambulatory Visit: Payer: Self-pay | Admitting: Internal Medicine

## 2016-12-02 NOTE — Telephone Encounter (Signed)
Appointment already scheduled on October 16th.

## 2016-12-02 NOTE — Telephone Encounter (Signed)
paxil done erx  Ok for shirron to contact pt - please make return office visit for further refills

## 2016-12-02 NOTE — Telephone Encounter (Signed)
Please schedule pt for appt.

## 2016-12-04 ENCOUNTER — Ambulatory Visit: Payer: Medicare HMO | Admitting: Internal Medicine

## 2016-12-09 ENCOUNTER — Ambulatory Visit: Payer: Self-pay | Admitting: Internal Medicine

## 2017-01-01 ENCOUNTER — Other Ambulatory Visit: Payer: Self-pay | Admitting: Internal Medicine

## 2017-01-06 ENCOUNTER — Other Ambulatory Visit: Payer: Self-pay | Admitting: Internal Medicine

## 2017-01-06 ENCOUNTER — Encounter: Payer: Self-pay | Admitting: Internal Medicine

## 2017-01-06 ENCOUNTER — Ambulatory Visit (INDEPENDENT_AMBULATORY_CARE_PROVIDER_SITE_OTHER): Payer: Medicare Other | Admitting: Internal Medicine

## 2017-01-06 ENCOUNTER — Other Ambulatory Visit (INDEPENDENT_AMBULATORY_CARE_PROVIDER_SITE_OTHER): Payer: Medicare Other

## 2017-01-06 VITALS — BP 144/94 | HR 89 | Temp 97.8°F | Ht 64.0 in | Wt 230.0 lb

## 2017-01-06 DIAGNOSIS — M79642 Pain in left hand: Secondary | ICD-10-CM | POA: Diagnosis not present

## 2017-01-06 DIAGNOSIS — Z23 Encounter for immunization: Secondary | ICD-10-CM | POA: Diagnosis not present

## 2017-01-06 DIAGNOSIS — Z1159 Encounter for screening for other viral diseases: Secondary | ICD-10-CM | POA: Diagnosis not present

## 2017-01-06 DIAGNOSIS — E2839 Other primary ovarian failure: Secondary | ICD-10-CM

## 2017-01-06 DIAGNOSIS — E039 Hypothyroidism, unspecified: Secondary | ICD-10-CM

## 2017-01-06 DIAGNOSIS — G4700 Insomnia, unspecified: Secondary | ICD-10-CM | POA: Diagnosis not present

## 2017-01-06 DIAGNOSIS — K219 Gastro-esophageal reflux disease without esophagitis: Secondary | ICD-10-CM

## 2017-01-06 DIAGNOSIS — M79641 Pain in right hand: Secondary | ICD-10-CM | POA: Diagnosis not present

## 2017-01-06 LAB — T4, FREE: Free T4: 0.72 ng/dL (ref 0.60–1.60)

## 2017-01-06 LAB — TSH: TSH: 11.13 u[IU]/mL — ABNORMAL HIGH (ref 0.35–4.50)

## 2017-01-06 MED ORDER — PAROXETINE HCL 30 MG PO TABS: 30.0000 mg | ORAL_TABLET | Freq: Every day | ORAL | 3 refills | Status: DC

## 2017-01-06 MED ORDER — ONDANSETRON HCL 4 MG PO TABS: ORAL_TABLET | ORAL | 1 refills | Status: DC

## 2017-01-06 MED ORDER — TRIAMCINOLONE ACETONIDE 55 MCG/ACT NA AERO: 2.0000 | INHALATION_SPRAY | Freq: Every day | NASAL | 12 refills | Status: DC

## 2017-01-06 MED ORDER — AMITRIPTYLINE HCL 100 MG PO TABS: ORAL_TABLET | ORAL | 1 refills | Status: DC

## 2017-01-06 MED ORDER — RANITIDINE HCL 150 MG PO TABS: 150.0000 mg | ORAL_TABLET | Freq: Every day | ORAL | 3 refills | Status: DC

## 2017-01-06 MED ORDER — DICLOFENAC SODIUM 1 % TD GEL: 4.0000 g | Freq: Four times a day (QID) | TRANSDERMAL | 5 refills | Status: DC | PRN

## 2017-01-06 NOTE — Progress Notes (Signed)
Subjective:    Patient ID: Jenny Acosta, female    DOB: 07/30/1945, 71 y.o.   MRN: [id number]  HPI  Here to f/u; overall doing ok,  Pt denies chest pain, increasing sob or doe, wheezing, orthopnea, PND, increased LE swelling, palpitations, dizziness or syncope.  Pt denies new neurological symptoms such as new headache, or facial or extremity weakness or numbness.  Pt denies polydipsia, polyuria, or low sugar episode.  Pt states overall good compliance with meds, mostly trying to follow appropriate diet, with wt overall stable Wt Readings from Last 3 Encounters:  01/06/17 230 lb (104.3 kg)  05/08/16 228 lb (103.4 kg)  09/11/15 225 lb (102.1 kg)  Due for tetanus  Denies hyper or hypo thyroid symptoms such as voice, skin or hair change.  Out of thyroid med for several weeks.  Asks for increased paxil to 30 mg as she used to take, hoping it helps again with sleeps well.  . Also with right ear pressure and discomfort down the neck in conjuction with bilat sinus congestion clearish without blood, fever or pain.  Also has bilat hand pain multlple joints without significant swelling but at least mild to mod for many months, worse to use the hands.  Also c/o mild to mod worsening reflux, without abd pain, dysphagia, n/v, bowel change or blood, despite zantac.  Also mentions worsening insomnia for several months, hard to get to sleep and stay asleep.   Past Medical History:  Diagnosis Date  . Allergic rhinitis, cause unspecified   . Arthritis   . Carotid stenosis   . Family history of colon cancer 07/21/2013   mother  . Gallstone   . GERD (gastroesophageal reflux disease)   . Hyperlipidemia 08/30/2010  . Hypertension   . Hypothyroidism   . Impaired glucose tolerance   . Pain, coccyx   . Right lumbar radiculopathy 08/30/2010   Past Surgical History:  Procedure Laterality Date  . APPENDECTOMY  1953  . CHOLECYSTECTOMY  2002  . CHOLECYSTECTOMY OPEN  10 years ago  . scarlet fever     age 71  .  TONSILLECTOMY  1959    reports that  has never smoked. she has never used smokeless tobacco. She reports that she does not drink alcohol or use drugs. family history includes Arthritis in her sister; Atrial fibrillation in her sister; Colon cancer in her mother; Diabetes in her daughter and maternal grandmother; Stroke in her mother. Allergies  Allergen Reactions  . Lipitor [Atorvastatin]     myalgia  . Methocarbamol Other (See Comments)    hallucinations  . Simvastatin     Hair loss  . Sulfa Antibiotics     unknown   Current Outpatient Medications on File Prior to Visit  Medication Sig Dispense Refill  . acetaminophen (TYLENOL) 500 MG tablet Take 500 mg by mouth every 6 (six) hours as needed. For pain    . aspirin EC 81 MG tablet Take 1 tablet (81 mg total) by mouth daily. 90 tablet 11  . Plecanatide (TRULANCE) 3 MG TABS Take 1 tablet by mouth daily. 30 tablet 3  . tiZANidine (ZANAFLEX) 4 MG tablet TAKE 1 TABLET (4 MG TOTAL) BY MOUTH 3 (THREE) TIMES DAILY AS NEEDED FOR MUSCLE SPASMS. 270 tablet 0  . traMADol (ULTRAM) 50 MG tablet TAKE 1 TABLET BY MOUTH EVERY 6 HOURS AS NEEDED (((DNF 02/08/16))) 120 tablet 5   No current facility-administered medications on file prior to visit.    Review of Systems  Constitutional:  Negative for other unusual diaphoresis or sweats HENT: Negative for ear discharge or swelling Eyes: Negative for other worsening visual disturbances Respiratory: Negative for stridor or other swelling  Gastrointestinal: Negative for worsening distension or other blood Genitourinary: Negative for retention or other urinary change Musculoskeletal: Negative for other MSK pain or swelling Skin: Negative for color change or other new lesions Neurological: Negative for worsening tremors and other numbness  Psychiatric/Behavioral: Negative for worsening agitation or other fatigue All other system neg per pt    Objective:   Physical Exam BP (!) 144/94   Pulse 89   Temp  97.8 F (36.6 C) (Oral)   Ht 5\' 4"  (1.626 m)   Wt 230 lb (104.3 kg)   SpO2 98%   BMI 39.48 kg/m  VS noted,  Constitutional: Pt appears in NAD HENT: Head: NCAT.  Right Ear: External ear normal.  Left Ear: External ear normal.  Eyes: . Pupils are equal, round, and reactive to light. Conjunctivae and EOM are normal Nose: without d/c or deformity Bilat tm's with mild erythema.  Max sinus areas mild tender.  Pharynx with mild erythema, no exudate Neck: Neck supple. Gross normal ROM Cardiovascular: Normal rate and regular rhythm.   Pulmonary/Chest: Effort normal and breath sounds without rales or wheezing.  Abd:  Soft, NT, ND, + BS, no organomegaly Neurological: Pt is alert. At baseline orientation, motor grossly intact Skin: Skin is warm. No rashes, other new lesions, no LE edema Psychiatric: Pt behavior is normal without agitation  Bilat hands with multiple OA joints without effusions       Assessment & Plan:

## 2017-01-06 NOTE — Patient Instructions (Addendum)
You had the flu shot today, and Tetanus shot  Please take all new medication as prescribed - the voltaren gel, and the Elavil  Please schedule the bone density as you leave at the scheduling desk  Please continue all other medications as before, and refills have been done if requested - for the protonix and zantac  Please have the pharmacy call with any other refills you may need.  Please continue your efforts at being more active, low cholesterol diet, and weight control.  Please keep your appointments with your specialists as you may have planned  Please go to the LAB in the Basement (turn left off the elevator) for the tests to be done in 1 month for the thyroid  You will be contacted by phone if any changes need to be made immediately.  Otherwise, you will receive a letter about your results with an explanation, but please check with MyChart first.  Please remember to sign up for MyChart if you have not done so, as this will be important to you in the future with finding out test results, communicating by private email, and scheduling acute appointments online when needed.  Please return in 1 year for your yearly visit, or sooner if needed

## 2017-01-07 ENCOUNTER — Ambulatory Visit: Payer: Self-pay | Admitting: Nurse Practitioner

## 2017-01-07 ENCOUNTER — Encounter: Payer: Self-pay | Admitting: Internal Medicine

## 2017-01-07 LAB — HEPATITIS C ANTIBODY
Hepatitis C Ab: NONREACTIVE
SIGNAL TO CUT-OFF: 0.01 (ref <1.00)

## 2017-01-08 MED ORDER — NEOMYCIN-COLIST-HC-THONZONIUM 3.3-3-10-0.5 MG/ML OT SUSP: 3.0000 [drp] | Freq: Four times a day (QID) | OTIC | 0 refills | Status: DC

## 2017-01-08 MED ORDER — PANTOPRAZOLE SODIUM 40 MG PO TBEC: 40.0000 mg | DELAYED_RELEASE_TABLET | Freq: Every day | ORAL | 3 refills | Status: DC

## 2017-01-08 MED ORDER — LEVOTHYROXINE SODIUM 75 MCG PO TABS: ORAL_TABLET | ORAL | 3 refills | Status: DC

## 2017-01-08 NOTE — Assessment & Plan Note (Signed)
For elavil qhs prn,  to f/u any worsening symptoms or concerns

## 2017-01-08 NOTE — Assessment & Plan Note (Signed)
Ok for volt gel prn,  to f/u any worsening symptoms or concerns 

## 2017-01-08 NOTE — Assessment & Plan Note (Signed)
Mild uncontrolled, for add protonix qam, and zantac qhs,  to f/u any worsening symptoms or concerns

## 2017-01-08 NOTE — Assessment & Plan Note (Addendum)
stable overall by history and exam, and pt to continue medical treatment as before,  to f/u any worsening symptoms or concerns, has missed several med dosing, for f/u tsh today  Note:  Total time for pt hx, exam, review of record with pt in the room, determination of diagnoses and plan for further eval and tx is > 40 min, with over 50% spent in coordination and counseling of patient, including the differential dx, tx, further evaluation and other management of hypothyroidism, insomnia, GERD and bilateral hand pain

## 2017-01-12 ENCOUNTER — Telehealth: Payer: Self-pay

## 2017-01-12 NOTE — Telephone Encounter (Signed)
Have tried to contact patient several times to discuss results. Not able to reach.   Notes recorded by Roney MansGay, Shirron, CMA on 01/12/2017 at 2:08 PM EST Called pt, LVM  ------  Notes recorded by Roney MansGay, Shirron, CMA on 01/08/2017 at 10:15 AM EST Called, LVM ------  Notes recorded by Roney MansGay, Shirron, CMA on 01/07/2017 at 10:45 AM EST Called pt, LVM ------

## 2017-01-12 NOTE — Telephone Encounter (Signed)
-----   Message from Corwin LevinsJames W John, MD sent at 01/06/2017  5:58 PM EST ----- Left message on MyChart, pt to cont same tx except  The test results show that your current treatment is OK., except the TSH is elevated, which was expected.  Please restart the medication as prescribed, and return to the lab in 1 month for repeat testing.   Adea Geisel to please inform pt, I will do order

## 2017-01-14 ENCOUNTER — Telehealth: Payer: Self-pay | Admitting: Internal Medicine

## 2017-01-14 NOTE — Telephone Encounter (Signed)
Amitriptyline (ELAVIL) 100 MG tablet is needed a prior authorization.

## 2017-01-14 NOTE — Telephone Encounter (Signed)
Key: [id number] Submitted on 01/12/17 waiting for determination

## 2017-01-22 ENCOUNTER — Other Ambulatory Visit: Payer: Self-pay | Admitting: Internal Medicine

## 2017-01-29 ENCOUNTER — Inpatient Hospital Stay: Admission: RE | Admit: 2017-01-29 | Payer: Medicare Other | Source: Ambulatory Visit

## 2017-03-20 ENCOUNTER — Other Ambulatory Visit: Payer: Self-pay | Admitting: Internal Medicine

## 2017-03-23 NOTE — Telephone Encounter (Signed)
Done erx 

## 2017-04-17 ENCOUNTER — Other Ambulatory Visit: Payer: Self-pay

## 2017-04-17 MED ORDER — TIZANIDINE HCL 4 MG PO TABS: ORAL_TABLET | ORAL | 2 refills | Status: DC

## 2017-07-16 ENCOUNTER — Other Ambulatory Visit: Payer: Self-pay | Admitting: Internal Medicine

## 2017-08-11 ENCOUNTER — Other Ambulatory Visit: Payer: Self-pay | Admitting: Internal Medicine

## 2017-09-17 ENCOUNTER — Encounter: Payer: Self-pay | Admitting: Internal Medicine

## 2017-09-18 ENCOUNTER — Ambulatory Visit: Payer: Medicare Other | Admitting: Internal Medicine

## 2017-09-25 ENCOUNTER — Ambulatory Visit: Payer: Medicare Other | Admitting: Internal Medicine

## 2017-10-16 ENCOUNTER — Ambulatory Visit: Payer: Self-pay | Admitting: *Deleted

## 2017-10-16 NOTE — Telephone Encounter (Signed)
Pt called with coughing that has been going on for weeks but now feels like it has intensified. She is taking allergy medications and mucinex. She states that she will cough up some phlegm after taking the mucinex.  It is clear, no blood. Sometimes have post nasal drip. Feels like it is allergies. Sometimes feels feverish but has not checked her temp.  Home care advice given to patient with verbal understanding. She has an appointment already scheduled with he pcp on Wednesday.  Will call back for any concerns.  Reason for Disposition . [1] Patient also has allergy symptoms (e.g., itchy eyes, clear nasal discharge, postnasal drip) AND [2] they are acting up  Answer Assessment - Initial Assessment Questions 1. ONSET: "When did the cough begin?"      Intensified in the last 60 days 2. SEVERITY: "How bad is the cough today?"      Not bad today 3. RESPIRATORY DISTRESS: "Describe your breathing."      Normal breathing  4. FEVER: "Do you have a fever?" If so, ask: "What is your temperature, how was it measured, and when did it start?"     Sometimes feel like a fever but has  not check it 5. HEMOPTYSIS: "Are you coughing up any blood?" If so ask: "How much?" (flecks, streaks, tablespoons, etc.)     no 6. TREATMENT: "What have you done so far to treat the cough?" (e.g., meds, fluids, humidifier)     Meds, fluids 7. CARDIAC HISTORY: "Do you have any history of heart disease?" (e.g., heart attack, congestive heart failure)      no 8. LUNG HISTORY: "Do you have any history of lung disease?"  (e.g., pulmonary embolus, asthma, emphysema)     no 9. PE RISK FACTORS: "Do you have a history of blood clots?" (or: recent major surgery, recent prolonged travel, bedridden)     no 10. OTHER SYMPTOMS: "Do you have any other symptoms? (e.g., runny nose, wheezing, chest pain)       Feels tight in chest when coughing (feels like it fibromyalgia) 12. TRAVEL: "Have you traveled out of the country in the last month?"  (e.g., travel history, exposures)       no  Protocols used: COUGH - ACUTE NON-PRODUCTIVE-A-AH

## 2017-10-21 ENCOUNTER — Ambulatory Visit: Payer: Medicare Other | Admitting: Internal Medicine

## 2017-10-23 ENCOUNTER — Ambulatory Visit: Payer: Medicare Other | Admitting: Internal Medicine

## 2017-10-27 ENCOUNTER — Other Ambulatory Visit (INDEPENDENT_AMBULATORY_CARE_PROVIDER_SITE_OTHER): Payer: Medicare Other

## 2017-10-27 ENCOUNTER — Encounter: Payer: Self-pay | Admitting: Internal Medicine

## 2017-10-27 ENCOUNTER — Ambulatory Visit (INDEPENDENT_AMBULATORY_CARE_PROVIDER_SITE_OTHER): Payer: Medicare Other | Admitting: Internal Medicine

## 2017-10-27 ENCOUNTER — Ambulatory Visit (INDEPENDENT_AMBULATORY_CARE_PROVIDER_SITE_OTHER)
Admission: RE | Admit: 2017-10-27 | Discharge: 2017-10-27 | Disposition: A | Discharge status: Home / Self Care | Payer: Medicare Other | Source: Ambulatory Visit | Attending: Internal Medicine | Admitting: Internal Medicine

## 2017-10-27 VITALS — BP 148/94 | HR 100 | Temp 97.6°F | Ht 64.0 in | Wt 235.0 lb

## 2017-10-27 DIAGNOSIS — R7302 Impaired glucose tolerance (oral): Secondary | ICD-10-CM

## 2017-10-27 DIAGNOSIS — I10 Essential (primary) hypertension: Secondary | ICD-10-CM

## 2017-10-27 DIAGNOSIS — J309 Allergic rhinitis, unspecified: Secondary | ICD-10-CM | POA: Diagnosis not present

## 2017-10-27 DIAGNOSIS — E039 Hypothyroidism, unspecified: Secondary | ICD-10-CM

## 2017-10-27 DIAGNOSIS — E785 Hyperlipidemia, unspecified: Secondary | ICD-10-CM

## 2017-10-27 DIAGNOSIS — R059 Cough, unspecified: Secondary | ICD-10-CM

## 2017-10-27 DIAGNOSIS — R05 Cough: Secondary | ICD-10-CM

## 2017-10-27 DIAGNOSIS — Z23 Encounter for immunization: Secondary | ICD-10-CM

## 2017-10-27 DIAGNOSIS — K219 Gastro-esophageal reflux disease without esophagitis: Secondary | ICD-10-CM

## 2017-10-27 LAB — CBC WITH DIFFERENTIAL/PLATELET
Basophils Absolute: 0 10*3/uL (ref 0.0–0.1)
Basophils Relative: 0.3 % (ref 0.0–3.0)
Eosinophils Absolute: 0.2 10*3/uL (ref 0.0–0.7)
Eosinophils Relative: 1.8 % (ref 0.0–5.0)
HCT: 44.8 % (ref 36.0–46.0)
Hemoglobin: 14.9 g/dL (ref 12.0–15.0)
Lymphocytes Relative: 41.7 % (ref 12.0–46.0)
Lymphs Abs: 4.1 10*3/uL — ABNORMAL HIGH (ref 0.7–4.0)
MCHC: 33.3 g/dL (ref 30.0–36.0)
MCV: 84.8 fl (ref 78.0–100.0)
Monocytes Absolute: 0.7 10*3/uL (ref 0.1–1.0)
Monocytes Relative: 6.8 % (ref 3.0–12.0)
Neutro Abs: 4.9 10*3/uL (ref 1.4–7.7)
Neutrophils Relative %: 49.4 % (ref 43.0–77.0)
Platelets: 306 10*3/uL (ref 150.0–400.0)
RBC: 5.29 Mil/uL — ABNORMAL HIGH (ref 3.87–5.11)
RDW: 15.2 % (ref 11.5–15.5)
WBC: 9.9 10*3/uL (ref 4.0–10.5)

## 2017-10-27 LAB — LIPID PANEL
Cholesterol: 255 mg/dL — ABNORMAL HIGH (ref 0–200)
HDL: 57.1 mg/dL (ref >39.00)
NonHDL: 197.65
Total CHOL/HDL Ratio: 4
Triglycerides: 253 mg/dL — ABNORMAL HIGH (ref 0.0–149.0)
VLDL: 50.6 mg/dL — ABNORMAL HIGH (ref 0.0–40.0)

## 2017-10-27 LAB — BASIC METABOLIC PANEL
BUN: 10 mg/dL (ref 6–23)
CO2: 24 mEq/L (ref 19–32)
Calcium: 9.5 mg/dL (ref 8.4–10.5)
Chloride: 102 mEq/L (ref 96–112)
Creatinine, Ser: 0.78 mg/dL (ref 0.40–1.20)
GFR: 77.03 mL/min (ref >60.00)
Glucose, Bld: 105 mg/dL — ABNORMAL HIGH (ref 70–99)
Potassium: 4.1 mEq/L (ref 3.5–5.1)
Sodium: 138 mEq/L (ref 135–145)

## 2017-10-27 LAB — HEPATIC FUNCTION PANEL
ALT: 9 U/L (ref 0–35)
AST: 11 U/L (ref 0–37)
Albumin: 4.4 g/dL (ref 3.5–5.2)
Alkaline Phosphatase: 108 U/L (ref 39–117)
Bilirubin, Direct: 0 mg/dL (ref 0.0–0.3)
Total Bilirubin: 0.3 mg/dL (ref 0.2–1.2)
Total Protein: 7.9 g/dL (ref 6.0–8.3)

## 2017-10-27 LAB — LDL CHOLESTEROL, DIRECT: Direct LDL: 164 mg/dL

## 2017-10-27 LAB — HEMOGLOBIN A1C: Hgb A1c MFr Bld: 5.8 % (ref 4.6–6.5)

## 2017-10-27 LAB — T4, FREE: Free T4: 0.9 ng/dL (ref 0.60–1.60)

## 2017-10-27 LAB — TSH: TSH: 4.54 u[IU]/mL — ABNORMAL HIGH (ref 0.35–4.50)

## 2017-10-27 MED ORDER — TRIAMCINOLONE ACETONIDE 55 MCG/ACT NA AERO: 2.0000 | INHALATION_SPRAY | Freq: Every day | NASAL | 12 refills | Status: DC

## 2017-10-27 NOTE — Assessment & Plan Note (Signed)
stable overall by history and exam, recent data reviewed with pt, and pt to continue medical treatment as before,  to f/u any worsening symptoms or concerns  

## 2017-10-27 NOTE — Assessment & Plan Note (Signed)
Mild to mod, for restart nasacort asd,  to f/u any worsening symptoms or concerns 

## 2017-10-27 NOTE — Progress Notes (Signed)
Subjective:    Patient ID: Jenny Acosta, female    DOB: August 21, 1945, 72 y.o.   MRN: [id number]  HPI  Here to f/u; overall doing ok,  Pt denies chest pain, increasing sob or doe, wheezing, orthopnea, PND, increased LE swelling, palpitations, dizziness or syncope.  Pt denies new neurological symptoms such as new headache, or facial or extremity weakness or numbness.  Pt denies polydipsia, polyuria, or low sugar episode.  Pt states overall good compliance with meds, mostly trying to follow appropriate diet, with wt overall stable,  but little exercise however,  Does have several wks ongoing nasal allergy symptoms with clearish congestion, itch and sneezing, without fever, pain, ST, swelling or wheezing but has cough seems related to post nasal gtt.  Taking PPI for reflux and well controlled.  BP at home < 140.90  New elavil has worked out well for sleep.  Cut back on paxil only taking half of 30 mg Wt Readings from Last 3 Encounters:  10/27/17 235 lb (106.6 kg)  01/06/17 230 lb (104.3 kg)  05/08/16 228 lb (103.4 kg)   Past Medical History:  Diagnosis Date  . Allergic rhinitis, cause unspecified   . Arthritis   . Carotid stenosis   . Family history of colon cancer 07/21/2013   mother  . Gallstone   . GERD (gastroesophageal reflux disease)   . Hyperlipidemia 08/30/2010  . Hypertension   . Hypothyroidism   . Impaired glucose tolerance   . Pain, coccyx   . Right lumbar radiculopathy 08/30/2010   Past Surgical History:  Procedure Laterality Date  . APPENDECTOMY  1953  . CHOLECYSTECTOMY  2002  . CHOLECYSTECTOMY OPEN  10 years ago  . scarlet fever     age 50  . TONSILLECTOMY  1959    reports that she has never smoked. She has never used smokeless tobacco. She reports that she does not drink alcohol or use drugs. family history includes Arthritis in her sister; Atrial fibrillation in her sister; Colon cancer in her mother; Diabetes in her daughter and maternal grandmother; Stroke in her  mother. Allergies  Allergen Reactions  . Lipitor [Atorvastatin]     myalgia  . Methocarbamol Other (See Comments)    hallucinations  . Simvastatin     Hair loss  . Sulfa Antibiotics     unknown   Current Outpatient Medications on File Prior to Visit  Medication Sig Dispense Refill  . acetaminophen (TYLENOL) 500 MG tablet Take 500 mg by mouth every 6 (six) hours as needed. For pain    . amitriptyline (ELAVIL) 100 MG tablet TAKE 1/2 - 1 TABLET BY MOUTH AT BEDTIME FOR SLEEP 90 tablet 1  . aspirin EC 81 MG tablet Take 1 tablet (81 mg total) by mouth daily. 90 tablet 11  . diclofenac sodium (VOLTAREN) 1 % GEL Apply 4 g 4 (four) times daily as needed topically. 400 g 5  . levothyroxine (SYNTHROID, LEVOTHROID) 75 MCG tablet TAKE 1 TABLET (75 MCG TOTAL) BY MOUTH DAILY. 90 tablet 3  . neomycin-colistin-hydrocortisone-thonzonium (CORTISPORIN-TC) 3.04-26-08-0.5 MG/ML OTIC suspension Place 3 drops 4 (four) times daily into the right ear. 10 mL 0  . ondansetron (ZOFRAN) 4 MG tablet TAKE 1 TABLET (4 MG TOTAL) BY MOUTH EVERY 8 HOURS AS NEEDED FOR NAUSEA OR VOMITING. 30 tablet 0  . pantoprazole (PROTONIX) 40 MG tablet Take 1 tablet (40 mg total) daily by mouth. 90 tablet 3  . PARoxetine (PAXIL) 30 MG tablet Take 1 tablet (30 mg total) daily  by mouth. 90 tablet 3  . Plecanatide (TRULANCE) 3 MG TABS Take 1 tablet by mouth daily. 30 tablet 3  . ranitidine (ZANTAC) 150 MG tablet Take 1 tablet (150 mg total) at bedtime by mouth. 90 tablet 3  . tiZANidine (ZANAFLEX) 4 MG tablet TAKE 1 TABLET (4 MG TOTAL) BY MOUTH 3 (THREE) TIMES DAILY AS NEEDED FOR MUSCLE SPASMS. 270 tablet 2  . traMADol (ULTRAM) 50 MG tablet TAKE 1 TABLET BY MOUTH EVERY 6 HOURS AS NEEDED 120 tablet 5   No current facility-administered medications on file prior to visit.    Review of Systems  Constitutional: Negative for other unusual diaphoresis or sweats HENT: Negative for ear discharge or swelling Eyes: Negative for other worsening  visual disturbances Respiratory: Negative for stridor or other swelling  Gastrointestinal: Negative for worsening distension or other blood Genitourinary: Negative for retention or other urinary change Musculoskeletal: Negative for other MSK pain or swelling Skin: Negative for color change or other new lesions Neurological: Negative for worsening tremors and other numbness  Psychiatric/Behavioral: Negative for worsening agitation or other fatigue All other system neg per pt    Objective:   Physical Exam BP (!) 148/94   Pulse 100   Temp 97.6 F (36.4 C) (Oral)   Ht 5\' 4"  (1.626 m)   Wt 235 lb (106.6 kg)   SpO2 95%   BMI 40.34 kg/m  VS noted,  Constitutional: Pt appears in NAD HENT: Head: NCAT.  Right Ear: External ear normal.  Left Ear: External ear normal.  \Bilat tm's with mild erythema.  Max sinus areas non tender.  Pharynx with mild erythema, no exudat Eyes: . Pupils are equal, round, and reactive to light. Conjunctivae and EOM are normal Nose: without d/c or deformity Neck: Neck supple. Gross normal ROM Cardiovascular: Normal rate and regular rhythm.   Pulmonary/Chest: Effort normal and breath sounds without rales or wheezing.  Abd:  Soft, NT, ND, + BS, no organomegaly Neurological: Pt is alert. At baseline orientation, motor grossly intact Skin: Skin is warm. No rashes, other new lesions, no LE edema Psychiatric: Pt behavior is normal without agitation  No other exam findings Lab Results  Component Value Date   WBC 8.4 07/02/2016   HGB 14.5 07/02/2016   HCT 43.4 07/02/2016   PLT 277.0 07/02/2016   GLUCOSE 109 (H) 07/02/2016   CHOL 291 (H) 07/02/2016   TRIG 251.0 (H) 07/02/2016   HDL 54.50 07/02/2016   LDLDIRECT 188.0 07/02/2016   LDLCALC 193 (H) 07/21/2013   ALT 18 07/02/2016   AST 17 07/02/2016   NA 139 07/02/2016   K 4.0 07/02/2016   CL 104 07/02/2016   CREATININE 0.81 07/02/2016   BUN 9 07/02/2016   CO2 23 07/02/2016   TSH 11.13 (H) 01/06/2017    HGBA1C 5.8 07/02/2016      Assessment & Plan:

## 2017-10-27 NOTE — Assessment & Plan Note (Signed)
Also for cxr 

## 2017-10-27 NOTE — Patient Instructions (Addendum)
You had the flu shot today  Please take all new medication as prescribed - the nasacort  Please continue all other medications as before, including the Paxil at half of the 30 mg  Please have the pharmacy call with any other refills you may need.  Please continue your efforts at being more active, low cholesterol diet, and weight control.  You are otherwise up to date with prevention measures today.  Please keep your appointments with your specialists as you may have planned  Please go to the XRAY Department in the Basement (go straight as you get off the elevator) for the x-ray testing  Please go to the LAB in the Basement (turn left off the elevator) for the tests to be done today  You will be contacted by phone if any changes need to be made immediately.  Otherwise, you will receive a letter about your results with an explanation, but please check with MyChart first.  Please remember to sign up for MyChart if you have not done so, as this will be important to you in the future with finding out test results, communicating by private email, and scheduling acute appointments online when needed.  Please return in 6 months, or sooner if needed

## 2017-10-28 ENCOUNTER — Telehealth: Payer: Self-pay

## 2017-10-28 ENCOUNTER — Other Ambulatory Visit: Payer: Self-pay | Admitting: Internal Medicine

## 2017-10-28 DIAGNOSIS — J849 Interstitial pulmonary disease, unspecified: Secondary | ICD-10-CM

## 2017-10-28 NOTE — Telephone Encounter (Signed)
Called pt, LVM.   CRM created.  

## 2017-10-28 NOTE — Telephone Encounter (Signed)
-----   Message from [name], MD sent at 10/28/2017  1:05 PM EDT ----- Left message on MyChart, pt to cont same tx except  The test results show that your current treatment is OK, except the xray is suggestive of possible interstitial lung disease, which is sort of a scarring process that can get worse and hurt the lung tissues.  We should order a "high resolution CT scan" for the chest, Pulmonary Functions tests (PFT's), and refer you to Pulmonary for further consideration.  You should be called about these things and informed by the office as well.    Paola Flynt to please inform pt, I will do referral and orders x 2

## 2017-10-29 ENCOUNTER — Other Ambulatory Visit: Payer: Self-pay | Admitting: Internal Medicine

## 2017-10-30 MED ORDER — TRAMADOL HCL 50 MG PO TABS: 50.0000 mg | ORAL_TABLET | Freq: Four times a day (QID) | ORAL | 5 refills | Status: DC | PRN

## 2017-10-30 NOTE — Telephone Encounter (Signed)
Done erx 

## 2017-11-05 ENCOUNTER — Inpatient Hospital Stay: Admission: RE | Admit: 2017-11-05 | Payer: Medicare Other | Source: Ambulatory Visit

## 2017-11-06 ENCOUNTER — Institutional Professional Consult (permissible substitution): Payer: Medicare Other | Admitting: Pulmonary Disease

## 2017-11-06 ENCOUNTER — Other Ambulatory Visit: Payer: Self-pay | Admitting: Internal Medicine

## 2017-11-06 ENCOUNTER — Ambulatory Visit (INDEPENDENT_AMBULATORY_CARE_PROVIDER_SITE_OTHER)
Admission: RE | Admit: 2017-11-06 | Discharge: 2017-11-06 | Disposition: A | Discharge status: Home / Self Care | Payer: Medicare Other | Source: Ambulatory Visit | Attending: Internal Medicine | Admitting: Internal Medicine

## 2017-11-06 ENCOUNTER — Encounter: Payer: Self-pay | Admitting: Pulmonary Disease

## 2017-11-06 ENCOUNTER — Ambulatory Visit (INDEPENDENT_AMBULATORY_CARE_PROVIDER_SITE_OTHER): Payer: Medicare Other | Admitting: Pulmonary Disease

## 2017-11-06 VITALS — BP 142/84 | HR 105 | Ht 63.58 in | Wt 234.0 lb

## 2017-11-06 DIAGNOSIS — R9389 Abnormal findings on diagnostic imaging of other specified body structures: Secondary | ICD-10-CM | POA: Diagnosis not present

## 2017-11-06 DIAGNOSIS — I712 Thoracic aortic aneurysm, without rupture: Secondary | ICD-10-CM

## 2017-11-06 DIAGNOSIS — J849 Interstitial pulmonary disease, unspecified: Secondary | ICD-10-CM

## 2017-11-06 DIAGNOSIS — R05 Cough: Secondary | ICD-10-CM | POA: Diagnosis not present

## 2017-11-06 DIAGNOSIS — R059 Cough, unspecified: Secondary | ICD-10-CM

## 2017-11-06 DIAGNOSIS — J479 Bronchiectasis, uncomplicated: Secondary | ICD-10-CM | POA: Diagnosis not present

## 2017-11-06 DIAGNOSIS — I7121 Aneurysm of the ascending aorta, without rupture: Secondary | ICD-10-CM

## 2017-11-06 NOTE — Progress Notes (Signed)
Jenny Acosta    [id number]    August 22, 1945  Primary Care Physician:John, Len Blalock, MD  Referring Physician: [name], MD [address] [place], [place] [address]  Chief complaint: Abnormal chest x-ray  HPI: 72 year old with history of hyperlipidemia, allergies, postnasal drip, GERD Complains of chronic cough for the past 6 months.  Nonproductive in nature.  Not associated with dyspnea, wheezing, sputum production.  She has significant acid reflux, heartburn and reports food getting stuck while swallowing, dysphagia.  She also has problems with postnasal drip, allergic rhinitis  Recently started on Protonix 2 weeks ago by Dr. Jonny Ruiz,  PCP.  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  Lived all over the [facility]. Relevant family history: No significant family history of lung disease.  Physical Exam: Blood pressure (!) 142/84, pulse (!) 105, height 5' 3.58" (1.615 m), weight 234 lb (106.1 kg), SpO2 94 %. Gen:      No acute distress HEENT:  EOMI, sclera anicteric Neck:     No masses; no thyromegaly Lungs:    Clear to auscultation bilaterally; normal respiratory effort CV:         Regular rate and rhythm; no murmurs Abd:      + bowel sounds; soft, non-tender; no palpable masses, no distension Ext:    No edema; adequate peripheral perfusion Skin:      Warm and dry; no rash Neuro: alert and oriented x 3 Psych: normal mood and affect  Data Reviewed: Imaging: CT abdomen 08/03/2010-visualized lung bases are clear Chest x-ray 11/27/2011-clear lungs Chest x-ray 12/03/2013-clear lungs Chest x-ray 10/27/2017-mild interstitial prominence. I have  reviewed the images personally.  PFTs:  Labs:  CBC 10/27/2017-WBC 9.9, eos 1.8%, absolute eosinophil count 178 ANA, rheumatoid factor 01/18/13-negative  Assessment:  Chronic cough,?  Interstitial lung disease Suspect cough is upper airway from postnasal drip, acid reflux She has been started on Protonix 2 weeks ago We will maximize treatment for postnasal drip with chlorpheniramine 8 mg 3 times daily.  She will continue on Nasonex Consider barium swallow and evaluation by GI for dysmotility, possible aspiration  She is already scheduled for high res CT later today.  We will review the results when available  Plan/Recommendations: - Continue Protonix - Chlorpheniramine, Nasonex - Review High res CT.  Outpatient Encounter Medications as of 11/06/2017  Medication Sig  . acetaminophen (TYLENOL) 500 MG tablet Take 500 mg by mouth every 6 (six) hours as needed. For pain  . amitriptyline (ELAVIL) 100 MG tablet TAKE 1/2 - 1 TABLET BY MOUTH AT BEDTIME FOR SLEEP  . levothyroxine (SYNTHROID, LEVOTHROID) 75 MCG tablet TAKE 1 TABLET (75 MCG TOTAL) BY MOUTH DAILY.  Marland Kitchen ondansetron (ZOFRAN) 4 MG tablet TAKE 1 TABLET (4 MG TOTAL) BY MOUTH EVERY 8 HOURS AS NEEDED FOR NAUSEA OR VOMITING.  . pantoprazole (PROTONIX) 40 MG tablet Take 1 tablet (40 mg total) daily by mouth.  Marland Kitchen PARoxetine (PAXIL) 30 MG tablet Take 1 tablet (30 mg total) daily by mouth. (Patient taking differently: Take 15 mg by mouth daily. )  . ranitidine (ZANTAC) 150 MG tablet Take 1 tablet (150 mg total) at bedtime by mouth.  Marland Kitchen  tiZANidine (ZANAFLEX) 4 MG tablet TAKE 1 TABLET (4 MG TOTAL) BY MOUTH 3 (THREE) TIMES DAILY AS NEEDED FOR MUSCLE SPASMS.  Marland Kitchen traMADol (ULTRAM) 50 MG tablet Take 1 tablet (50 mg total) by mouth every 6 (six) hours as needed.  . triamcinolone (NASACORT) 55 MCG/ACT AERO nasal inhaler Place 2 sprays into the nose daily.  Marland Kitchen neomycin-colistin-hydrocortisone-thonzonium (CORTISPORIN-TC) 3.04-26-08-0.5 MG/ML OTIC suspension  Place 3 drops 4 (four) times daily into the right ear. (Patient not taking: Reported on 11/06/2017)  . [DISCONTINUED] aspirin EC 81 MG tablet Take 1 tablet (81 mg total) by mouth daily. (Patient not taking: Reported on 11/06/2017)  . [DISCONTINUED] diclofenac sodium (VOLTAREN) 1 % GEL Apply 4 g 4 (four) times daily as needed topically. (Patient not taking: Reported on 11/06/2017)  . [DISCONTINUED] Plecanatide (TRULANCE) 3 MG TABS Take 1 tablet by mouth daily.   No facility-administered encounter medications on file as of 11/06/2017.     Allergies as of 11/06/2017 - Review Complete 11/06/2017  Allergen Reaction Noted  . Lipitor [atorvastatin]  07/21/2013  . Methocarbamol Other (See Comments) 08/30/2010  . Simvastatin  12/15/2013  . Sulfa antibiotics  08/30/2010    Past Medical History:  Diagnosis Date  . Allergic rhinitis, cause unspecified   . Arthritis   . Carotid stenosis   . Family history of colon cancer 07/21/2013   mother  . Gallstone   . GERD (gastroesophageal reflux disease)   . Hyperlipidemia 08/30/2010  . Hypertension   . Hypothyroidism   . Impaired glucose tolerance   . Pain, coccyx   . Right lumbar radiculopathy 08/30/2010    Past Surgical History:  Procedure Laterality Date  . APPENDECTOMY  1953  . CHOLECYSTECTOMY  2002  . CHOLECYSTECTOMY OPEN  10 years ago  . scarlet fever     age 89  . TONSILLECTOMY  1959    Family History  Problem Relation Age of Onset  . Colon cancer Mother   . Stroke Mother   . Arthritis Sister   . Atrial fibrillation Sister        has pacemaker  . Diabetes Maternal Grandmother   . Diabetes Daughter   . Stomach cancer Neg Hx     Social History   Socioeconomic History  . Marital status: Single    Spouse name: Not on file  . Number of children: Not on file  . Years of education: Not on file  . Highest education level: Not on file  Occupational History  . Not on file  Social Needs  . Financial resource strain: Not on file  .  Food insecurity:    Worry: Not on file    Inability: Not on file  . Transportation needs:    Medical: Not on file    Non-medical: Not on file  Tobacco Use  . Smoking status: Never Smoker  . Smokeless tobacco: Never Used  Substance and Sexual Activity  . Alcohol use: No  . Drug use: No  . Sexual activity: Not on file  Lifestyle  . Physical activity:    Days per week: Not on file    Minutes per session: Not on file  . Stress: Not on file  Relationships  . Social connections:    Talks on phone: Not on file    Gets together: Not on file    Attends religious service: Not on file    Active member of club or organization: Not on file    Attends meetings of clubs or organizations: Not on  file    Relationship status: Not on file  . Intimate partner violence:    Fear of current or ex partner: Not on file    Emotionally abused: Not on file    Physically abused: Not on file    Forced sexual activity: Not on file  Other Topics Concern  . Not on file  Social History Narrative  . Not on file   Review of systems: Review of Systems  Constitutional: Negative for fever and chills.  HENT: Negative.   Eyes: Negative for blurred vision.  Respiratory: as per HPI  Cardiovascular: Negative for chest pain and palpitations.  Gastrointestinal: Negative for vomiting, diarrhea, blood per rectum. Genitourinary: Negative for dysuria, urgency, frequency and hematuria.  Musculoskeletal: Negative for myalgias, back pain and joint pain.  Skin: Negative for itching and rash.  Neurological: Negative for dizziness, tremors, focal weakness, seizures and loss of consciousness.  Endo/Heme/Allergies: Negative for environmental allergies.  Psychiatric/Behavioral: Negative for depression, suicidal ideas and hallucinations.  All other systems reviewed and are negative.  Chilton GreathousePraveen Eliasar Hlavaty MD [facility] 11/06/2017, 2:33 PM  CC: Corwin LevinsJohn, James W, MD

## 2017-11-06 NOTE — Patient Instructions (Signed)
Start chlorpheniramine 8 mg 3 times daily over-the-counter Continue Nasonex spray, Protonix I will review the CT scan when available Follow-up in 2 to 4 weeks.

## 2017-11-09 ENCOUNTER — Telehealth: Payer: Self-pay

## 2017-11-09 NOTE — Telephone Encounter (Signed)
Pt has viewed results via MyChart  

## 2017-11-09 NOTE — Telephone Encounter (Signed)
-----   Message from Corwin LevinsJames W John, MD sent at 11/06/2017  4:35 PM EDT ----- Left message on MyChart, pt to cont same tx except  The test results show that your current treatment is OK, except there is the findings of a kind of hypersenstivity reaction in the lungs, as well as the incidental finding of a relatively large aortic anuerysm in the chest area.  You have been referred to Pulmonary, but we also need to refer you to Cardiothoracic Surgury for further consideration.  You should also hear from the office about this  Russell Quinney to please inform pt, I will do referral also as above

## 2017-11-26 ENCOUNTER — Institutional Professional Consult (permissible substitution) (INDEPENDENT_AMBULATORY_CARE_PROVIDER_SITE_OTHER): Payer: Medicare Other | Admitting: Surgery

## 2017-11-26 VITALS — BP 132/78 | HR 100 | Resp 20 | Ht 64.0 in | Wt 230.0 lb

## 2017-11-26 DIAGNOSIS — I712 Thoracic aortic aneurysm, without rupture: Secondary | ICD-10-CM | POA: Diagnosis not present

## 2017-11-26 DIAGNOSIS — I7121 Aneurysm of the ascending aorta, without rupture: Secondary | ICD-10-CM

## 2017-11-27 ENCOUNTER — Encounter: Payer: Self-pay | Admitting: Surgery

## 2017-11-27 NOTE — Progress Notes (Signed)
Cardiothoracic Surgery Consultation  PCP is [name], MD Referring Provider is [name], MD  Chief Complaint  Patient presents with  . Thoracic Aortic Aneurysm    Surgical eval, Chest CT 11/06/17, PFT's 10/28/17    HPI:  The patient is a 72 year old woman with a history of hypertension, hyperlipidemia, hypothyroidism, GERD, and fibromyalgia who was recently evaluated by pulmonary medicine for a 88-month history of chronic nonproductive cough.  She had a chest x-ray that showed mild interstitial lung changes and then had a high-resolution CT scan of the chest which showed some findings suggestive of chronic hypersensitivity pneumonitis.  There is an incidental 5.3 cm fusiform ascending aortic aneurysm.  She had calcific plaque throughout her proximal coronary vessels.  She was started on Protonix for possible reflux with aspiration and said that her cough is much improved.  She has no family history of aortic valve disease, aortic aneurysm disease, aortic dissection, or connective tissue disorder. Past Medical History:  Diagnosis Date  . Allergic rhinitis, cause unspecified   . Arthritis   . Carotid stenosis   . Family history of colon cancer 07/21/2013   mother  . Gallstone   . GERD (gastroesophageal reflux disease)   . Hyperlipidemia 08/30/2010  . Hypertension   . Hypothyroidism   . Impaired glucose tolerance   . Pain, coccyx   . Right lumbar radiculopathy 08/30/2010    Past Surgical History:  Procedure Laterality Date  . APPENDECTOMY  1953  . CHOLECYSTECTOMY  2002  . CHOLECYSTECTOMY OPEN  10 years ago  . scarlet fever     age 30  . TONSILLECTOMY  1959    Family History  Problem Relation Age of Onset  . Colon cancer Mother   . Stroke Mother   . Arthritis Sister   . Atrial fibrillation Sister        has pacemaker  . Diabetes Maternal Grandmother   . Diabetes Daughter   . Stomach cancer Neg Hx     Social History Social History   Tobacco Use  . Smoking  status: Never Smoker  . Smokeless tobacco: Never Used  Substance Use Topics  . Alcohol use: No  . Drug use: No    Current Outpatient Medications  Medication Sig Dispense Refill  . acetaminophen (TYLENOL) 500 MG tablet Take 500 mg by mouth every 6 (six) hours as needed. For pain    . amitriptyline (ELAVIL) 100 MG tablet TAKE 1/2 - 1 TABLET BY MOUTH AT BEDTIME FOR SLEEP 90 tablet 1  . levothyroxine (SYNTHROID, LEVOTHROID) 75 MCG tablet TAKE 1 TABLET (75 MCG TOTAL) BY MOUTH DAILY. 90 tablet 3  . ondansetron (ZOFRAN) 4 MG tablet TAKE 1 TABLET (4 MG TOTAL) BY MOUTH EVERY 8 HOURS AS NEEDED FOR NAUSEA OR VOMITING. 30 tablet 0  . pantoprazole (PROTONIX) 40 MG tablet Take 1 tablet (40 mg total) daily by mouth. 90 tablet 3  . PARoxetine (PAXIL) 30 MG tablet Take 1 tablet (30 mg total) daily by mouth. (Patient taking differently: Take 15 mg by mouth daily. ) 90 tablet 3  . traMADol (ULTRAM) 50 MG tablet Take 1 tablet (50 mg total) by mouth every 6 (six) hours as needed. 120 tablet 5  . triamcinolone (NASACORT) 55 MCG/ACT AERO nasal inhaler Place 2 sprays into the nose daily. 1 Inhaler 12   No current facility-administered medications for this visit.     Allergies  Allergen Reactions  . Lipitor [Atorvastatin]     myalgia  . Methocarbamol  Other (See Comments)    hallucinations  . Simvastatin     Hair loss  . Sulfa Antibiotics     unknown    Review of Systems  Constitutional: Positive for appetite change and fatigue.  HENT: Positive for dental problem and hearing loss.   Eyes: Negative.   Respiratory: Positive for cough and shortness of breath.   Cardiovascular: Negative for chest pain, palpitations and leg swelling.  Gastrointestinal: Positive for abdominal pain and constipation.       Reflux  Endocrine: Negative.   Genitourinary: Negative.   Musculoskeletal: Positive for arthralgias and myalgias.  Allergic/Immunologic: Negative.   Neurological: Positive for dizziness.    Hematological: Negative.   Psychiatric/Behavioral:       Depression    BP 132/78   Pulse 100   Resp 20   Ht 5\' 4"  (1.626 m)   Wt 230 lb (104.3 kg)   SpO2 96% Comment: RA  BMI 39.48 kg/m  Physical Exam  Constitutional: She is oriented to person, place, and time.  Obese woman in no distress  HENT:  Head: Normocephalic and atraumatic.  Mouth/Throat: Oropharynx is clear and moist.  Eyes: Pupils are equal, round, and reactive to light. EOM are normal.  Neck: Normal range of motion. No thyromegaly present.  Cardiovascular: Normal rate, regular rhythm and normal heart sounds.  No murmur heard. Pulmonary/Chest: Effort normal and breath sounds normal. No respiratory distress. She has no wheezes. She has no rales.  Abdominal: Soft. She exhibits no distension. There is no tenderness.  Musculoskeletal: She exhibits no edema.  Lymphadenopathy:    She has no cervical adenopathy.  Neurological: She is alert and oriented to person, place, and time.  Skin: Skin is warm and dry.  Psychiatric: She has a normal mood and affect.     Diagnostic Tests:  CLINICAL DATA:  Chronic cough and dyspnea. Evaluate for interstitial lung disease.  EXAM: CT CHEST WITHOUT CONTRAST  TECHNIQUE: Multidetector CT imaging of the chest was performed following the standard protocol without intravenous contrast. High resolution imaging of the lungs, as well as inspiratory and expiratory imaging, was performed.  COMPARISON:  10/27/2017 chest radiograph.  FINDINGS: Cardiovascular: Normal heart size. Trace pericardial effusion/thickening. Three-vessel coronary atherosclerosis. Atherosclerotic thoracic aorta with 5.3 cm ascending thoracic aortic aneurysm. Top-normal main pulmonary artery (3.1 cm diameter).  Mediastinum/Nodes: No discrete thyroid nodules. Unremarkable esophagus. No pathologically enlarged axillary, mediastinal or hilar lymph nodes, noting limited sensitivity for the detection of  hilar adenopathy on this noncontrast study.  Lungs/Pleura: No pneumothorax. No pleural effusion. No acute consolidative airspace disease or lung masses. Mild mosaic attenuation in both lungs, seen to represent moderate air trapping on the expiration sequence. Mild patchy subpleural reticulation throughout both lungs with scattered mild traction bronchiolectasis (for example series 3/image 112 anteriorly in the left lower lobe and image 121 in the basilar right lower lobe). There is patchy centrilobular micronodularity throughout both lungs. No clear basilar gradient to these findings. No frank honeycombing.  Upper abdomen: Cholecystectomy.  Musculoskeletal: No aggressive appearing focal osseous lesions. Marked thoracic spondylosis.  IMPRESSION: 1. Moderate patchy air trapping, indicative of small airways disease. Patchy centrilobular micronodularity, mild subpleural reticulation and mild traction bronchiolectasis throughout both lungs without apicobasilar gradient. This spectrum of findings is suggestive of chronic hypersensitivity pneumonitis. 2. Ascending thoracic aortic 5.3 cm aneurysm. Recommend semi-annual imaging followup by CTA or MRA and referral to cardiothoracic surgery if not already obtained. This recommendation follows 2010 ACCF/AHA/AATS/ACR/ASA/SCA/SCAI/SIR/STS/SVM Guidelines for the Diagnosis and Management of Patients  With Thoracic Aortic Disease. Circulation. 2010; 121: Z610-R604. 3. Three-vessel coronary atherosclerosis.  Aortic Atherosclerosis (ICD10-I70.0).   Electronically Signed   By: Delbert Phenix M.D.   On: 11/06/2017 16:16   Impression:  This 72 year old woman has a 5.3 cm fusiform ascending aortic aneurysm of unknown duration.  There is calcific plaque present throughout her proximal coronary arteries.  Her aneurysm is below the surgical threshold of 5.5 cm.  Her descending aorta has a diameter of about 3.3 cm.  Her blood pressure is under  good control when I stressed the importance of that and preventing further enlargement of the aneurysm and aortic dissection.  She is not on a beta-blocker but may benefit from that and I would leave that decision up to her primary physician.  I reviewed the CT images with her and answered her questions.  I have recommended that we repeat the study in 6 months to reassess the size.  She would like to do an MRA to avoid radiation exposure and I think that is fine.  Plan:  I will see her back in 6 months with an MRA of the chest.   I spent 30 minutes performing this consultation and > 50% of this time was spent face to face counseling and coordinating the care of this patient's ascending aortic aneurysm.   Alleen Borne, MD [facility] [phone]

## 2017-12-04 ENCOUNTER — Ambulatory Visit: Payer: Medicare Other | Admitting: Pulmonary Disease

## 2017-12-14 ENCOUNTER — Encounter: Payer: Self-pay | Admitting: Internal Medicine

## 2017-12-15 ENCOUNTER — Telehealth: Payer: Self-pay

## 2017-12-15 NOTE — Telephone Encounter (Signed)
Key: [id number]

## 2017-12-21 ENCOUNTER — Encounter: Payer: Self-pay | Admitting: Internal Medicine

## 2017-12-21 MED ORDER — AMITRIPTYLINE HCL 100 MG PO TABS: ORAL_TABLET | ORAL | 0 refills | Status: DC

## 2017-12-22 ENCOUNTER — Other Ambulatory Visit: Payer: Self-pay | Admitting: Internal Medicine

## 2017-12-22 NOTE — Telephone Encounter (Signed)
Approved 02/22/17-02/23/18

## 2018-01-07 ENCOUNTER — Ambulatory Visit: Payer: Medicare Other | Admitting: Internal Medicine

## 2018-01-23 ENCOUNTER — Other Ambulatory Visit: Payer: Self-pay | Admitting: Internal Medicine

## 2018-01-28 ENCOUNTER — Ambulatory Visit: Payer: Medicare Other | Admitting: Pulmonary Disease

## 2018-02-06 ENCOUNTER — Other Ambulatory Visit: Payer: Self-pay | Admitting: Internal Medicine

## 2018-02-11 ENCOUNTER — Encounter: Payer: Self-pay | Admitting: Internal Medicine

## 2018-02-11 MED ORDER — ROSUVASTATIN CALCIUM 20 MG PO TABS: 20.0000 mg | ORAL_TABLET | Freq: Every day | ORAL | 3 refills | Status: DC

## 2018-02-11 NOTE — Telephone Encounter (Signed)
crestor 20 mg done erx  Shirron to please assist pt with making appt in 3 months for repeat CT scan

## 2018-02-12 ENCOUNTER — Telehealth: Payer: Self-pay | Admitting: Internal Medicine

## 2018-02-12 MED ORDER — PRAVASTATIN SODIUM 80 MG PO TABS: 80.0000 mg | ORAL_TABLET | Freq: Every day | ORAL | 3 refills | Status: DC

## 2018-02-12 NOTE — Telephone Encounter (Signed)
Copied from CRM [phone]. Topic: Quick Communication - See Telephone Encounter >> Feb 12, 2018  9:28 AM Windy KalataMichael, Raider Valbuena L, NT wrote: CRM for notification. See Telephone encounter for: 02/12/18.  Patient is calling and states rosuvastatin (CRESTOR) 20 MG tablet  is not covered under patient insurance and would like to switch it to a alternative like atorvastatin.  [id number]- [place], [place] - [address] [place] [id number] Phone: [phone] Fax: [phone]

## 2018-02-12 NOTE — Addendum Note (Signed)
Addended by: Corwin LevinsJOHN, JAMES W on: 02/12/2018 01:10 PM   Modules accepted: Orders

## 2018-02-12 NOTE — Telephone Encounter (Signed)
Ok to try change to pravastatin (milder statin)  - done erx

## 2018-03-06 DIAGNOSIS — K047 Periapical abscess without sinus: Secondary | ICD-10-CM | POA: Diagnosis not present

## 2018-03-06 DIAGNOSIS — R220 Localized swelling, mass and lump, head: Secondary | ICD-10-CM | POA: Diagnosis not present

## 2018-03-16 ENCOUNTER — Ambulatory Visit: Payer: Medicare Other | Admitting: Pulmonary Disease

## 2018-03-18 ENCOUNTER — Telehealth: Payer: Self-pay | Admitting: Internal Medicine

## 2018-03-18 NOTE — Telephone Encounter (Signed)
Copied from CRM [id number]. Topic: General - Other >> Mar 18, 2018  3:48 PM Lorrine Kin, NT wrote: Reason for CRM: Patient calling and would like to get a handicap placard for her vehicle. States that she has not had one before. Would like paperwork mailed to her, if possible. Please advise.  Are you okay with this being completed?

## 2018-03-18 NOTE — Telephone Encounter (Signed)
Ok sure, and I will sign, thanks

## 2018-03-19 NOTE — Telephone Encounter (Signed)
Form has been completed & placed in providers box to sign.  °

## 2018-03-19 NOTE — Telephone Encounter (Signed)
Form has been signed, copy sent to scan. &Mailed to patient as requested.   LVM to inform patient.

## 2018-03-25 ENCOUNTER — Other Ambulatory Visit: Payer: Self-pay | Admitting: *Deleted

## 2018-03-25 DIAGNOSIS — I712 Thoracic aortic aneurysm, without rupture, unspecified: Secondary | ICD-10-CM

## 2018-03-31 ENCOUNTER — Other Ambulatory Visit: Payer: Self-pay | Admitting: *Deleted

## 2018-03-31 DIAGNOSIS — I712 Thoracic aortic aneurysm, without rupture, unspecified: Secondary | ICD-10-CM

## 2018-04-08 ENCOUNTER — Other Ambulatory Visit: Payer: Self-pay | Admitting: Internal Medicine

## 2018-04-25 ENCOUNTER — Inpatient Hospital Stay: Admission: RE | Admit: 2018-04-25 | Payer: Medicare Other | Source: Ambulatory Visit

## 2018-04-26 ENCOUNTER — Other Ambulatory Visit: Payer: Self-pay | Admitting: Internal Medicine

## 2018-04-28 ENCOUNTER — Ambulatory Visit: Payer: Medicare Other | Admitting: Surgery

## 2018-04-30 ENCOUNTER — Ambulatory Visit: Payer: Medicare Other | Admitting: Internal Medicine

## 2018-05-13 ENCOUNTER — Other Ambulatory Visit: Payer: Self-pay | Admitting: Internal Medicine

## 2018-06-08 ENCOUNTER — Other Ambulatory Visit: Payer: Self-pay | Admitting: Internal Medicine

## 2018-06-09 NOTE — Telephone Encounter (Signed)
Done erx 

## 2018-07-08 ENCOUNTER — Encounter: Payer: Self-pay | Admitting: Internal Medicine

## 2018-07-23 ENCOUNTER — Other Ambulatory Visit: Payer: Self-pay | Admitting: Internal Medicine

## 2018-08-10 ENCOUNTER — Other Ambulatory Visit: Payer: Self-pay | Admitting: Internal Medicine

## 2018-08-11 ENCOUNTER — Other Ambulatory Visit: Payer: Self-pay | Admitting: Internal Medicine

## 2018-09-27 ENCOUNTER — Other Ambulatory Visit: Payer: Self-pay | Admitting: Internal Medicine

## 2018-10-28 ENCOUNTER — Other Ambulatory Visit: Payer: Self-pay | Admitting: Internal Medicine

## 2018-12-19 ENCOUNTER — Other Ambulatory Visit: Payer: Self-pay | Admitting: Internal Medicine

## 2018-12-24 ENCOUNTER — Other Ambulatory Visit: Payer: Self-pay | Admitting: Internal Medicine

## 2019-01-04 ENCOUNTER — Other Ambulatory Visit: Payer: Self-pay | Admitting: Internal Medicine

## 2019-01-04 ENCOUNTER — Telehealth: Payer: Self-pay

## 2019-01-04 MED ORDER — TRAMADOL HCL 50 MG PO TABS: 50.0000 mg | ORAL_TABLET | Freq: Four times a day (QID) | ORAL | 0 refills | Status: DC | PRN

## 2019-01-04 NOTE — Telephone Encounter (Signed)
Copied from [place] [id number]. Topic: Quick Communication - Rx Refill/Question >> Jan 04, 2019  3:53 PM Leward Quan A wrote: Medication: traMADol (ULTRAM) 50 MG tablet   Has the patient contacted their pharmacy? No. (Agent: If no, request that the patient contact the pharmacy for the refill.) (Agent: If yes, when and what did the pharmacy advise?)  Preferred Pharmacy (with phone number or street name): [id number] [place], [place] [phone] (Phone) [phone] (Fax)    Agent: Please be advised that RX refills may take up to 3 business days. We ask that you follow-up with your pharmacy.

## 2019-01-04 NOTE — Telephone Encounter (Signed)
See other note

## 2019-01-04 NOTE — Telephone Encounter (Signed)
Done erx  Please let pt know - will need ROV for further refills

## 2019-01-04 NOTE — Telephone Encounter (Signed)
Pt called wanting a refill on Tramadol stating that she has one pill left. She stated that she has an OV with PCP because of her previous refill being denied for Tramadol. I informed her that her last OV was 10/2017 and she is overdue the year mark for an OV and let her know that PCP is very strict about controlled medication refills and will not be able to send any in at this time until her OV. She expressed understanding.     Copied from [place] [phone]. Topic: General - Other >> Jan 04, 2019  3:52 PM Leward Quan A wrote: Reason for CRM: Patient called to request a call back from Dr Jenny Reichmann nurse states that she have some questions and concerns about getting a medication. Can be reached at Ph# [phone]

## 2019-01-06 ENCOUNTER — Ambulatory Visit: Payer: Medicare Other | Admitting: Internal Medicine

## 2019-01-07 ENCOUNTER — Ambulatory Visit: Payer: Medicare Other | Admitting: Internal Medicine

## 2019-01-10 ENCOUNTER — Encounter: Payer: Self-pay | Admitting: Internal Medicine

## 2019-01-11 ENCOUNTER — Ambulatory Visit (INDEPENDENT_AMBULATORY_CARE_PROVIDER_SITE_OTHER): Payer: Medicare Other | Admitting: Internal Medicine

## 2019-01-11 ENCOUNTER — Other Ambulatory Visit (INDEPENDENT_AMBULATORY_CARE_PROVIDER_SITE_OTHER): Payer: Medicare Other

## 2019-01-11 ENCOUNTER — Other Ambulatory Visit: Payer: Self-pay

## 2019-01-11 ENCOUNTER — Encounter: Payer: Self-pay | Admitting: Internal Medicine

## 2019-01-11 VITALS — BP 128/86 | HR 100 | Temp 97.6°F | Ht 64.0 in | Wt 248.0 lb

## 2019-01-11 DIAGNOSIS — E039 Hypothyroidism, unspecified: Secondary | ICD-10-CM | POA: Diagnosis not present

## 2019-01-11 DIAGNOSIS — E611 Iron deficiency: Secondary | ICD-10-CM

## 2019-01-11 DIAGNOSIS — E559 Vitamin D deficiency, unspecified: Secondary | ICD-10-CM

## 2019-01-11 DIAGNOSIS — R7302 Impaired glucose tolerance (oral): Secondary | ICD-10-CM | POA: Diagnosis not present

## 2019-01-11 DIAGNOSIS — E785 Hyperlipidemia, unspecified: Secondary | ICD-10-CM | POA: Diagnosis not present

## 2019-01-11 DIAGNOSIS — R0600 Dyspnea, unspecified: Secondary | ICD-10-CM

## 2019-01-11 DIAGNOSIS — R5383 Other fatigue: Secondary | ICD-10-CM | POA: Diagnosis not present

## 2019-01-11 DIAGNOSIS — I10 Essential (primary) hypertension: Secondary | ICD-10-CM | POA: Diagnosis not present

## 2019-01-11 DIAGNOSIS — E538 Deficiency of other specified B group vitamins: Secondary | ICD-10-CM

## 2019-01-11 LAB — CBC WITH DIFFERENTIAL/PLATELET
Basophils Absolute: 0.1 10*3/uL (ref 0.0–0.1)
Basophils Relative: 0.9 % (ref 0.0–3.0)
Eosinophils Absolute: 0.2 10*3/uL (ref 0.0–0.7)
Eosinophils Relative: 2.1 % (ref 0.0–5.0)
HCT: 46.4 % — ABNORMAL HIGH (ref 36.0–46.0)
Hemoglobin: 15.1 g/dL — ABNORMAL HIGH (ref 12.0–15.0)
Lymphocytes Relative: 40.6 % (ref 12.0–46.0)
Lymphs Abs: 4.4 10*3/uL — ABNORMAL HIGH (ref 0.7–4.0)
MCHC: 32.6 g/dL (ref 30.0–36.0)
MCV: 85.7 fl (ref 78.0–100.0)
Monocytes Absolute: 0.8 10*3/uL (ref 0.1–1.0)
Monocytes Relative: 7.2 % (ref 3.0–12.0)
Neutro Abs: 5.4 10*3/uL (ref 1.4–7.7)
Neutrophils Relative %: 49.2 % (ref 43.0–77.0)
Platelets: 296 10*3/uL (ref 150.0–400.0)
RBC: 5.41 Mil/uL — ABNORMAL HIGH (ref 3.87–5.11)
RDW: 15.8 % — ABNORMAL HIGH (ref 11.5–15.5)
WBC: 10.9 10*3/uL — ABNORMAL HIGH (ref 4.0–10.5)

## 2019-01-11 LAB — URINALYSIS, ROUTINE W REFLEX MICROSCOPIC
Bilirubin Urine: NEGATIVE
Hgb urine dipstick: NEGATIVE
Ketones, ur: NEGATIVE
Nitrite: NEGATIVE
RBC / HPF: NONE SEEN (ref 0–?)
Specific Gravity, Urine: 1.01 (ref 1.000–1.030)
Total Protein, Urine: NEGATIVE
Urine Glucose: NEGATIVE
Urobilinogen, UA: 0.2 (ref 0.0–1.0)
pH: 6.5 (ref 5.0–8.0)

## 2019-01-11 LAB — BRAIN NATRIURETIC PEPTIDE: Pro B Natriuretic peptide (BNP): 46 pg/mL (ref 0.0–100.0)

## 2019-01-11 LAB — BASIC METABOLIC PANEL
BUN: 15 mg/dL (ref 6–23)
CO2: 27 mEq/L (ref 19–32)
Calcium: 9.5 mg/dL (ref 8.4–10.5)
Chloride: 101 mEq/L (ref 96–112)
Creatinine, Ser: 0.7 mg/dL (ref 0.40–1.20)
GFR: 81.84 mL/min (ref >60.00)
Glucose, Bld: 111 mg/dL — ABNORMAL HIGH (ref 70–99)
Potassium: 4.4 mEq/L (ref 3.5–5.1)
Sodium: 137 mEq/L (ref 135–145)

## 2019-01-11 LAB — IBC PANEL
Iron: 79 ug/dL (ref 42–145)
Saturation Ratios: 18 % — ABNORMAL LOW (ref 20.0–50.0)
Transferrin: 314 mg/dL (ref 212.0–360.0)

## 2019-01-11 LAB — LIPID PANEL
Cholesterol: 292 mg/dL — ABNORMAL HIGH (ref 0–200)
HDL: 60.7 mg/dL (ref >39.00)
NonHDL: 230.82
Total CHOL/HDL Ratio: 5
Triglycerides: 247 mg/dL — ABNORMAL HIGH (ref 0.0–149.0)
VLDL: 49.4 mg/dL — ABNORMAL HIGH (ref 0.0–40.0)

## 2019-01-11 LAB — HEPATIC FUNCTION PANEL
ALT: 16 U/L (ref 0–35)
AST: 16 U/L (ref 0–37)
Albumin: 4.5 g/dL (ref 3.5–5.2)
Alkaline Phosphatase: 120 U/L — ABNORMAL HIGH (ref 39–117)
Bilirubin, Direct: 0.1 mg/dL (ref 0.0–0.3)
Total Bilirubin: 0.4 mg/dL (ref 0.2–1.2)
Total Protein: 8.2 g/dL (ref 6.0–8.3)

## 2019-01-11 LAB — HEMOGLOBIN A1C: Hgb A1c MFr Bld: 5.8 % (ref 4.6–6.5)

## 2019-01-11 LAB — TSH: TSH: 4.65 u[IU]/mL — ABNORMAL HIGH (ref 0.35–4.50)

## 2019-01-11 LAB — LDL CHOLESTEROL, DIRECT: Direct LDL: 187 mg/dL

## 2019-01-11 MED ORDER — TIZANIDINE HCL 4 MG PO TABS: ORAL_TABLET | ORAL | 3 refills | Status: DC

## 2019-01-11 MED ORDER — PAROXETINE HCL 30 MG PO TABS: 30.0000 mg | ORAL_TABLET | Freq: Every day | ORAL | 3 refills | Status: DC

## 2019-01-11 MED ORDER — PANTOPRAZOLE SODIUM 40 MG PO TBEC: 40.0000 mg | DELAYED_RELEASE_TABLET | Freq: Every day | ORAL | 3 refills | Status: DC

## 2019-01-11 MED ORDER — LEVOTHYROXINE SODIUM 75 MCG PO TABS: 75.0000 ug | ORAL_TABLET | Freq: Every day | ORAL | 3 refills | Status: DC

## 2019-01-11 MED ORDER — AMITRIPTYLINE HCL 100 MG PO TABS: ORAL_TABLET | ORAL | 1 refills | Status: DC

## 2019-01-11 MED ORDER — PRAVASTATIN SODIUM 80 MG PO TABS: 80.0000 mg | ORAL_TABLET | Freq: Every day | ORAL | 3 refills | Status: DC

## 2019-01-11 MED ORDER — TRIAMCINOLONE ACETONIDE 55 MCG/ACT NA AERO: 2.0000 | INHALATION_SPRAY | Freq: Every day | NASAL | 12 refills | Status: DC

## 2019-01-11 NOTE — Progress Notes (Signed)
Subjective:    Patient ID: Jenny Acosta, female    DOB: 1945/12/31, 73 y.o.   MRN: [id number]  HPI  Here to f/u; overall doing ok,  Pt denies chest pain, wheezing, orthopnea, PND, increased LE swelling, palpitations, dizziness or syncope except has gained wt and now with worsening SOB/DOE with stairs and unusual fatigue.  Pt denies new neurological symptoms such as new headache, or facial or extremity weakness or numbness.  Pt denies polydipsia, polyuria, or low sugar episode.  Pt states overall good compliance with meds, mostly trying to follow appropriate diet, with wt overall stable,  but little exercise   She walks with seated walker purchased herslef now, more unsteady recently, has gained significant wt, no falls but has overall lack of strength, has chronic pain "all over."  Wondering if unstead is worse with the elavil, due to shaking and jerking "like parkinsons".but does help the nerve pain. Marland Kitchen  Has reduced the elavil to 50 mg due to visual hallucinations, does help her greatly with sleep so wants to continue.  Lives by [facility]   May consider mammogram, but to call on her own.  Has MRi sched tomorrow for hernia check.  Denies worsening reflux, abd pain, dysphagia, n/v, bowel change or blood.  Denies hyper or hypo thyroid symptoms such as voice, skin or hair change. Past Medical History:  Diagnosis Date  . Allergic rhinitis, cause unspecified   . Arthritis   . Carotid stenosis   . Family history of colon cancer 07/21/2013   mother  . Gallstone   . GERD (gastroesophageal reflux disease)   . Hyperlipidemia 08/30/2010  . Hypertension   . Hypothyroidism   . Impaired glucose tolerance   . Pain, coccyx   . Right lumbar radiculopathy 08/30/2010   Past Surgical History:  Procedure Laterality Date  . APPENDECTOMY  1953  . CHOLECYSTECTOMY  2002  . CHOLECYSTECTOMY OPEN  10 years ago  . scarlet fever     age 47  . TONSILLECTOMY  1959    reports that she has never smoked. She has never used  smokeless tobacco. She reports that she does not drink alcohol or use drugs. family history includes Arthritis in her sister; Atrial fibrillation in her sister; Colon cancer in her mother; Diabetes in her daughter and maternal grandmother; Stroke in her mother. Allergies  Allergen Reactions  . Lipitor [Atorvastatin]     myalgia  . Methocarbamol Other (See Comments)    hallucinations  . Simvastatin     Hair loss  . Sulfa Antibiotics     unknown   Current Outpatient Medications on File Prior to Visit  Medication Sig Dispense Refill  . acetaminophen (TYLENOL) 500 MG tablet Take 500 mg by mouth every 6 (six) hours as needed. For pain    . ondansetron (ZOFRAN) 4 MG tablet TAKE 1 TABLET BY MOUTH EVERY 8 HOURS AS NEEDED FOR NAUSEA AND VOMITING 30 tablet 0  . traMADol (ULTRAM) 50 MG tablet Take 1 tablet (50 mg total) by mouth every 6 (six) hours as needed. 120 tablet 0   No current facility-administered medications on file prior to visit.     Review of Systems  Constitutional: Negative for other unusual diaphoresis or sweats HENT: Negative for ear discharge or swelling Eyes: Negative for other worsening visual disturbances Respiratory: Negative for stridor or other swelling  Gastrointestinal: Negative for worsening distension or other blood Genitourinary: Negative for retention or other urinary change Musculoskeletal: Negative for other MSK pain or swelling Skin:  Negative for color change or other new lesions Neurological: Negative for worsening tremors and other numbness  Psychiatric/Behavioral: Negative for worsening agitation or other fatigue All otherwise neg per pt     Objective:   Physical Exam BP 128/86   Pulse 100   Temp 97.6 F (36.4 C) (Oral)   Ht 5\' 4"  (1.626 m)   Wt 248 lb (112.5 kg)   SpO2 95%   BMI 42.57 kg/m  VS noted,  Constitutional: Pt appears in NAD HENT: Head: NCAT.  Right Ear: External ear normal.  Left Ear: External ear normal.  Eyes: . Pupils are  equal, round, and reactive to light. Conjunctivae and EOM are normal Nose: without d/c or deformity Neck: Neck supple. Gross normal ROM Cardiovascular: Normal rate and regular rhythm.   Pulmonary/Chest: Effort normal and breath sounds without rales or wheezing.  Abd:  Soft, NT, ND, + BS, no organomegaly Neurological: Pt is alert. At baseline orientation, motor grossly intact Skin: Skin is warm. No rashes, other new lesions, no LE edema Psychiatric: Pt behavior is normal without agitation  All otherwise neg per pt Lab Results  Component Value Date   WBC 10.9 (H) 01/11/2019   HGB 15.1 (H) 01/11/2019   HCT 46.4 (H) 01/11/2019   PLT 296.0 01/11/2019   GLUCOSE 111 (H) 01/11/2019   CHOL 292 (H) 01/11/2019   TRIG 247.0 (H) 01/11/2019   HDL 60.70 01/11/2019   LDLDIRECT 187.0 01/11/2019   LDLCALC 193 (H) 07/21/2013   ALT 16 01/11/2019   AST 16 01/11/2019   NA 137 01/11/2019   K 4.4 01/11/2019   CL 101 01/11/2019   CREATININE 0.70 01/11/2019   BUN 15 01/11/2019   CO2 27 01/11/2019   TSH 4.65 (H) 01/11/2019   HGBA1C 5.8 01/11/2019      Assessment & Plan:

## 2019-01-11 NOTE — Patient Instructions (Signed)

## 2019-01-12 ENCOUNTER — Inpatient Hospital Stay: Admission: RE | Admit: 2019-01-12 | Payer: Medicare Other | Source: Ambulatory Visit

## 2019-01-12 ENCOUNTER — Other Ambulatory Visit: Payer: Medicare Other

## 2019-01-12 LAB — VITAMIN B12: Vitamin B-12: 573 pg/mL (ref 211–911)

## 2019-01-12 LAB — VITAMIN D 25 HYDROXY (VIT D DEFICIENCY, FRACTURES): VITD: 22.56 ng/mL — ABNORMAL LOW (ref 30.00–100.00)

## 2019-01-13 ENCOUNTER — Other Ambulatory Visit: Payer: Self-pay | Admitting: Internal Medicine

## 2019-01-13 MED ORDER — VITAMIN D (ERGOCALCIFEROL) 1.25 MG (50000 UNIT) PO CAPS: 50000.0000 [IU] | ORAL_CAPSULE | ORAL | 0 refills | Status: DC

## 2019-01-13 MED ORDER — LEVOTHYROXINE SODIUM 88 MCG PO TABS: 88.0000 ug | ORAL_TABLET | Freq: Every day | ORAL | 3 refills | Status: DC

## 2019-01-16 ENCOUNTER — Encounter: Payer: Self-pay | Admitting: Internal Medicine

## 2019-01-16 NOTE — Assessment & Plan Note (Signed)
stable overall by history and exam, recent data reviewed with pt, and pt to continue medical treatment as before,  to f/u any worsening symptoms or concerns  

## 2019-01-16 NOTE — Assessment & Plan Note (Addendum)
Suspect deconditioning, for BNP with labs, exam benign,  to f/u any worsening symptoms or concerns  Note:  Total time for pt hx, exam, review of record with pt in the room, determination of diagnoses and plan for further eval and tx is > 40 min, with over 50% spent in coordination and counseling of patient including the differential dx, tx, further evaluation and other management of dyspnea, hyperglycemia, hypothyroidism, HTN, HLD, fatigue

## 2019-01-16 NOTE — Assessment & Plan Note (Signed)
Exam benign, for labs as ordered,  to f/u any worsening symptoms or concerns 

## 2019-02-07 ENCOUNTER — Ambulatory Visit
Admission: RE | Admit: 2019-02-07 | Discharge: 2019-02-07 | Disposition: A | Discharge status: Home / Self Care | Payer: Medicare Other | Source: Ambulatory Visit | Attending: Surgery | Admitting: Surgery

## 2019-02-07 ENCOUNTER — Other Ambulatory Visit: Payer: Self-pay

## 2019-02-07 ENCOUNTER — Other Ambulatory Visit: Payer: Self-pay | Admitting: Surgery

## 2019-02-07 DIAGNOSIS — I712 Thoracic aortic aneurysm, without rupture, unspecified: Secondary | ICD-10-CM

## 2019-02-11 ENCOUNTER — Other Ambulatory Visit: Payer: Self-pay | Admitting: Internal Medicine

## 2019-02-14 NOTE — Telephone Encounter (Signed)
Done erx 

## 2019-04-12 ENCOUNTER — Other Ambulatory Visit: Payer: Self-pay | Admitting: Internal Medicine

## 2019-04-22 ENCOUNTER — Other Ambulatory Visit: Payer: Self-pay | Admitting: Internal Medicine

## 2019-06-17 ENCOUNTER — Other Ambulatory Visit: Payer: Self-pay | Admitting: Internal Medicine

## 2019-06-17 NOTE — Telephone Encounter (Signed)
Done erx 

## 2019-07-11 ENCOUNTER — Encounter: Payer: Self-pay | Admitting: Internal Medicine

## 2019-07-15 ENCOUNTER — Ambulatory Visit: Payer: Medicare Other | Admitting: Internal Medicine

## 2019-07-20 ENCOUNTER — Telehealth: Payer: Self-pay

## 2019-07-20 DIAGNOSIS — I712 Thoracic aortic aneurysm, without rupture, unspecified: Secondary | ICD-10-CM

## 2019-07-20 NOTE — Telephone Encounter (Signed)
Ok this is done  Ok to let daughter know

## 2019-07-20 NOTE — Telephone Encounter (Signed)
New message    The patient daughter calling asking for an MRI referral patient suppose to be having one every 6 months due to an aneurysm.

## 2019-07-21 NOTE — Telephone Encounter (Signed)
Spoke with pt daughter and informed her of Dr. Raphael Gibney note of referral being sent in.

## 2019-08-02 ENCOUNTER — Ambulatory Visit: Payer: Medicare Other | Admitting: Internal Medicine

## 2019-08-11 ENCOUNTER — Other Ambulatory Visit: Payer: Medicare Other

## 2019-08-23 ENCOUNTER — Inpatient Hospital Stay: Admission: RE | Admit: 2019-08-23 | Payer: Medicare Other | Source: Ambulatory Visit

## 2019-08-23 ENCOUNTER — Ambulatory Visit: Payer: Medicare Other | Admitting: Internal Medicine

## 2019-09-26 ENCOUNTER — Inpatient Hospital Stay: Admission: RE | Admit: 2019-09-26 | Payer: Medicare Other | Source: Ambulatory Visit

## 2019-09-29 ENCOUNTER — Encounter: Payer: Self-pay | Admitting: Internal Medicine

## 2019-10-06 ENCOUNTER — Other Ambulatory Visit: Payer: Self-pay | Admitting: Internal Medicine

## 2019-10-06 NOTE — Telephone Encounter (Signed)
Done erx 

## 2019-10-15 ENCOUNTER — Other Ambulatory Visit: Payer: Self-pay | Admitting: Internal Medicine

## 2019-10-28 ENCOUNTER — Inpatient Hospital Stay: Admission: RE | Admit: 2019-10-28 | Payer: Medicare Other | Source: Ambulatory Visit

## 2019-10-31 ENCOUNTER — Encounter: Payer: Self-pay | Admitting: Internal Medicine

## 2019-11-01 ENCOUNTER — Ambulatory Visit: Payer: Medicare Other | Admitting: Internal Medicine

## 2019-11-11 DIAGNOSIS — Z23 Encounter for immunization: Secondary | ICD-10-CM | POA: Diagnosis not present

## 2019-12-08 ENCOUNTER — Encounter: Payer: Self-pay | Admitting: Internal Medicine

## 2019-12-09 DIAGNOSIS — Z23 Encounter for immunization: Secondary | ICD-10-CM | POA: Diagnosis not present

## 2020-01-02 ENCOUNTER — Telehealth: Payer: Self-pay | Admitting: Internal Medicine

## 2020-01-02 NOTE — Telephone Encounter (Addendum)
   Daughter calling back, states the [facility] nurse transferred her back to the office to make an appointment. Daughter declined to schedule next day appointment, stating she had something else to do. Appointment 11/10 with Jenny Acosta

## 2020-01-02 NOTE — Telephone Encounter (Signed)
    Patients daughter calling to report patient is having chest pain and coughing  Call transferred to [facility]

## 2020-01-04 ENCOUNTER — Ambulatory Visit: Payer: Medicare Other | Admitting: Internal Medicine

## 2020-01-08 ENCOUNTER — Other Ambulatory Visit: Payer: Self-pay | Admitting: Internal Medicine

## 2020-01-08 NOTE — Telephone Encounter (Signed)
paxil refill 1 month only  Ok to let pt know - due for ROV for further refills

## 2020-01-09 ENCOUNTER — Other Ambulatory Visit: Payer: Self-pay | Admitting: Internal Medicine

## 2020-01-11 ENCOUNTER — Other Ambulatory Visit: Payer: Self-pay | Admitting: Internal Medicine

## 2020-01-11 NOTE — Telephone Encounter (Signed)
Please refill as per office routine med refill policy (all routine meds refilled for 3 mo or monthly per pt preference up to one year from last visit, then month to month grace period for 3 mo, then further med refills will have to be denied)  

## 2020-01-17 ENCOUNTER — Inpatient Hospital Stay: Admission: RE | Admit: 2020-01-17 | Payer: Medicare Other | Source: Ambulatory Visit

## 2020-01-18 ENCOUNTER — Ambulatory Visit: Payer: Medicare Other | Admitting: Internal Medicine

## 2020-01-18 ENCOUNTER — Encounter: Payer: Self-pay | Admitting: Internal Medicine

## 2020-01-18 DIAGNOSIS — Z0289 Encounter for other administrative examinations: Secondary | ICD-10-CM

## 2020-01-23 ENCOUNTER — Telehealth: Payer: Self-pay | Admitting: Internal Medicine

## 2020-01-23 ENCOUNTER — Other Ambulatory Visit: Payer: Self-pay | Admitting: Internal Medicine

## 2020-01-23 NOTE — Telephone Encounter (Signed)
traMADol (ULTRAM) 50 MG tablet ondansetron (ZOFRAN) 4 MG tablet Requesting a refill Last seen- 11.17.2020 Next apt- 12.09.2021 [id number] [place], [place] - [address] Phone:  [phone]  Fax:  [phone]

## 2020-01-25 ENCOUNTER — Other Ambulatory Visit: Payer: Self-pay | Admitting: Internal Medicine

## 2020-01-25 ENCOUNTER — Encounter: Payer: Self-pay | Admitting: Internal Medicine

## 2020-01-25 NOTE — Telephone Encounter (Signed)
Sent to Dr. John. 

## 2020-01-25 NOTE — Telephone Encounter (Signed)
Tramadol done 1 mo only  Please to let pt know need rov for further refills

## 2020-02-02 ENCOUNTER — Ambulatory Visit: Payer: Medicare Other | Admitting: Internal Medicine

## 2020-02-07 ENCOUNTER — Other Ambulatory Visit: Payer: Self-pay | Admitting: Internal Medicine

## 2020-02-07 NOTE — Telephone Encounter (Signed)
paxil done 1 mo refill  Please to ask pt to make rov for further refills

## 2020-02-08 ENCOUNTER — Ambulatory Visit
Admission: RE | Admit: 2020-02-08 | Discharge: 2020-02-08 | Disposition: A | Discharge status: Home / Self Care | Payer: Medicare Other | Source: Ambulatory Visit | Attending: Internal Medicine | Admitting: Internal Medicine

## 2020-02-08 ENCOUNTER — Other Ambulatory Visit: Payer: Self-pay | Admitting: Internal Medicine

## 2020-02-08 DIAGNOSIS — I712 Thoracic aortic aneurysm, without rupture, unspecified: Secondary | ICD-10-CM

## 2020-02-08 NOTE — Telephone Encounter (Signed)
Please refill as per office routine med refill policy (all routine meds refilled for 3 mo or monthly per pt preference up to one year from last visit, then month to month grace period for 3 mo, then further med refills will have to be denied)  

## 2020-02-14 ENCOUNTER — Ambulatory Visit: Payer: Medicare Other | Admitting: Internal Medicine

## 2020-02-21 ENCOUNTER — Other Ambulatory Visit: Payer: Self-pay | Admitting: Internal Medicine

## 2020-02-27 ENCOUNTER — Encounter: Payer: Self-pay | Admitting: Internal Medicine

## 2020-02-27 ENCOUNTER — Ambulatory Visit (INDEPENDENT_AMBULATORY_CARE_PROVIDER_SITE_OTHER): Payer: Medicare Other | Admitting: Internal Medicine

## 2020-02-27 ENCOUNTER — Other Ambulatory Visit: Payer: Self-pay

## 2020-02-27 VITALS — BP 120/82 | HR 105 | Temp 98.1°F | Ht 64.0 in | Wt 251.0 lb

## 2020-02-27 DIAGNOSIS — E039 Hypothyroidism, unspecified: Secondary | ICD-10-CM

## 2020-02-27 DIAGNOSIS — E538 Deficiency of other specified B group vitamins: Secondary | ICD-10-CM

## 2020-02-27 DIAGNOSIS — I10 Essential (primary) hypertension: Secondary | ICD-10-CM | POA: Diagnosis not present

## 2020-02-27 DIAGNOSIS — M5431 Sciatica, right side: Secondary | ICD-10-CM | POA: Insufficient documentation

## 2020-02-27 DIAGNOSIS — R7302 Impaired glucose tolerance (oral): Secondary | ICD-10-CM | POA: Diagnosis not present

## 2020-02-27 DIAGNOSIS — R0609 Other forms of dyspnea: Secondary | ICD-10-CM

## 2020-02-27 DIAGNOSIS — M5432 Sciatica, left side: Secondary | ICD-10-CM

## 2020-02-27 DIAGNOSIS — E7849 Other hyperlipidemia: Secondary | ICD-10-CM

## 2020-02-27 DIAGNOSIS — E559 Vitamin D deficiency, unspecified: Secondary | ICD-10-CM

## 2020-02-27 MED ORDER — PANTOPRAZOLE SODIUM 40 MG PO TBEC: 40.0000 mg | DELAYED_RELEASE_TABLET | Freq: Every day | ORAL | 3 refills | Status: DC

## 2020-02-27 MED ORDER — GABAPENTIN 300 MG PO CAPS: ORAL_CAPSULE | ORAL | 1 refills | Status: DC

## 2020-02-27 MED ORDER — LEVOTHYROXINE SODIUM 88 MCG PO TABS: 88.0000 ug | ORAL_TABLET | Freq: Every day | ORAL | 3 refills | Status: DC

## 2020-02-27 MED ORDER — PAROXETINE HCL 30 MG PO TABS: 30.0000 mg | ORAL_TABLET | Freq: Every day | ORAL | 3 refills | Status: DC

## 2020-02-27 MED ORDER — THERA-D 2000 50 MCG (2000 UT) PO TABS: ORAL_TABLET | ORAL | 99 refills | Status: AC

## 2020-02-27 MED ORDER — TRAMADOL HCL 50 MG PO TABS: 50.0000 mg | ORAL_TABLET | Freq: Four times a day (QID) | ORAL | 5 refills | Status: DC | PRN

## 2020-02-27 MED ORDER — PRAVASTATIN SODIUM 80 MG PO TABS: 80.0000 mg | ORAL_TABLET | Freq: Every day | ORAL | 3 refills | Status: DC

## 2020-02-27 NOTE — Assessment & Plan Note (Signed)
stable overall by history and exam, recent data reviewed with pt, and pt to continue medical treatment as before,  to f/u any worsening symptoms or concerns Lab Results  Component Value Date   TSH 4.65 (H) 01/11/2019

## 2020-02-27 NOTE — Assessment & Plan Note (Signed)
Lab Results  Component Value Date   LDLCALC 193 (H) 07/21/2013   For lower chol diet, f/u lipids, cont med tx - pravachol 80

## 2020-02-27 NOTE — Assessment & Plan Note (Signed)
stable overall by history and exam, recent data reviewed with pt, and pt to continue medical treatment as before,  to f/u any worsening symptoms or concerns Lab Results  Component Value Date   HGBA1C 5.8 01/11/2019

## 2020-02-27 NOTE — Assessment & Plan Note (Signed)
I suspect related to obesity and deconditioning, but has hx of aneurysm, unable to tolerate f/u MRI, so for echo.

## 2020-02-27 NOTE — Patient Instructions (Signed)
Ok to try the gabapentin at 300 mg- 1-2 tabs at bedtime for back pain and leg pain  Please continue all other medications as before, and refills have been done if requested.  Please have the pharmacy call with any other refills you may need.  Please continue your efforts at being more active, low cholesterol diet, and weight control.  You are otherwise up to date with prevention measures today.  Please keep your appointments with your specialists as you may have planned  You will be contacted regarding the referral for: Echocardiogram for the shortness of breath and aortic  Please go to the LAB at the blood drawing area for the tests to be done at the [facility] lab at your convenience  You will be contacted by phone if any changes need to be made immediately.  Otherwise, you will receive a letter about your results with an explanation, but please check with MyChart first.  Please remember to sign up for MyChart if you have not done so, as this will be important to you in the future with finding out test results, communicating by private email, and scheduling acute appointments online when needed.  Please make an Appointment to return in 6 months, or sooner if needed

## 2020-02-27 NOTE — Progress Notes (Signed)
Established Patient Office Visit  Subjective:  Patient ID: Jenny Acosta, female    DOB: [birth year]  Age: 75 y.o. MRN: [id number]      Chief Complaint: (concise statement describing the symptom, problem, condition, diagnosis, physician recommended return, or other factor as reason for encounter) follow up HTN, HLD and hyperglycemia , dyspnea, aortic aneurysm, and bilateral sciatica       HPI:  [name] is a 75 y.o. female here to f/u; overall doing ok,  Pt denies chest pain, wheezing, orthopnea, PND, increased LE swelling, palpitations, dizziness or syncope, but has gained significant wt, c/o worsening difficulty breathing lying flat, cannot tolerate f/u MRI for the aneurysm, and mild excertional DOE, walks with cane..  Pt denies new neurological symptoms such as new headache, or facial or extremity weakness or numbness.  Pt denies polydipsia, polyuria, or symptomatic low sugars. Pt states overall good compliance with meds, mostly trying to follow appropriate diet but little exercise however,  Pt continues to have recurring LBP with bilat LE neuritic pains, but no bowel or bladder change, fever, wt loss,  worsening LE numbness/weakness, gait change or falls.  Pain overall worse at night, only gets a few hours sleep at a time.      Wt Readings from Last 3 Encounters:  02/27/20 251 lb (113.9 kg)  01/11/19 248 lb (112.5 kg)  11/26/17 230 lb (104.3 kg)   BP Readings from Last 3 Encounters:  02/27/20 120/82  01/11/19 128/86  11/26/17 132/78       Past Medical History:  Diagnosis Date  . Allergic rhinitis, cause unspecified   . Arthritis   . Carotid stenosis   . Family history of colon cancer 07/21/2013   mother  . Gallstone   . GERD (gastroesophageal reflux disease)   . Hyperlipidemia 08/30/2010  . Hypertension   . Hypothyroidism   . Impaired glucose tolerance   . Pain, coccyx   . Right lumbar radiculopathy 08/30/2010   Past Surgical History:  Procedure Laterality Date  .  APPENDECTOMY  1953  . CHOLECYSTECTOMY  2002  . CHOLECYSTECTOMY OPEN  10 years ago  . scarlet fever     age 74  . TONSILLECTOMY  1959    reports that she has never smoked. She has never used smokeless tobacco. She reports that she does not drink alcohol and does not use drugs. family history includes Arthritis in her sister; Atrial fibrillation in her sister; Colon cancer in her mother; Diabetes in her daughter and maternal grandmother; Stroke in her mother. Allergies  Allergen Reactions  . Lipitor [Atorvastatin]     myalgia  . Methocarbamol Other (See Comments)    hallucinations  . Simvastatin     Hair loss  . Sulfa Antibiotics     unknown   Current Outpatient Medications on File Prior to Visit  Medication Sig Dispense Refill  . acetaminophen (TYLENOL) 500 MG tablet Take 500 mg by mouth every 6 (six) hours as needed. For pain    . chlorpheniramine (CHLOR-TRIMETON) 4 MG tablet Take by mouth.    . ondansetron (ZOFRAN) 4 MG tablet TAKE 1 TABLET BY MOUTH EVERY 8 HOURS AS NEEDED FOR NAUSEA AND VOMITING 30 tablet 0  . tiZANidine (ZANAFLEX) 4 MG tablet TAKE 1 TAB BY MOUTH THREE TIMES DAILY AS NEEDED 270 tablet 3  . triamcinolone (NASACORT) 55 MCG/ACT AERO nasal inhaler Place 2 sprays into the nose daily. 1 Inhaler 12   No current facility-administered medications on file prior to visit.  ROS:  All others reviewed and negative.  Objective        PE:  BP 120/82 (BP Location: Left Arm, Patient Position: Sitting, Cuff Size: Large)   Pulse (!) 105   Temp 98.1 F (36.7 C) (Oral)   Ht 5\' 4"  (1.626 m)   Wt 251 lb (113.9 kg)   SpO2 94%   BMI 43.08 kg/m                 Constitutional: Pt appears in NAD               HENT: Head: NCAT.                Right Ear: External ear normal.                 Left Ear: External ear normal.                Eyes: . Pupils are equal, round, and reactive to light. Conjunctivae and EOM are normal               Nose: without d/c or deformity                Neck: Neck supple. Gross normal ROM               Cardiovascular: Normal rate and regular rhythm.                 Pulmonary/Chest: Effort normal and breath sounds without rales or wheezing.                Abd:  Soft, NT, ND, + BS, no organomegaly               Neurological: Pt is alert. At baseline orientation, motor grossly intact               Skin: Skin is warm. No rashes, no other new lesions, LE edema - trace bilat               Psychiatric: Pt behavior is normal without agitation   Assessment/Plan:  Jenny Acosta is a 75 y.o. White or Caucasian [1] female with  has a past medical history of Allergic rhinitis, cause unspecified, Arthritis, Carotid stenosis, Family history of colon cancer (07/21/2013), Gallstone, GERD (gastroesophageal reflux disease), Hyperlipidemia (08/30/2010), Hypertension, Hypothyroidism, Impaired glucose tolerance, Pain, coccyx, and Right lumbar radiculopathy (08/30/2010).   Assessment Plan  See problem oriented assessment and plan Labs reviewed for each problem: Lab Results  Component Value Date   WBC 10.9 (H) 01/11/2019   HGB 15.1 (H) 01/11/2019   HCT 46.4 (H) 01/11/2019   PLT 296.0 01/11/2019   GLUCOSE 111 (H) 01/11/2019   CHOL 292 (H) 01/11/2019   TRIG 247.0 (H) 01/11/2019   HDL 60.70 01/11/2019   LDLDIRECT 187.0 01/11/2019   LDLCALC 193 (H) 07/21/2013   ALT 16 01/11/2019   AST 16 01/11/2019   NA 137 01/11/2019   K 4.4 01/11/2019   CL 101 01/11/2019   CREATININE 0.70 01/11/2019   BUN 15 01/11/2019   CO2 27 01/11/2019   TSH 4.65 (H) 01/11/2019   HGBA1C 5.8 01/11/2019    Micro: none  Cardiac tracings I have personally interpreted today:  none  Pertinent Radiological findings (summarize): dc 15 2020 MRA chest   I spent total 33 minutes in caring for the patient for this visit:  1) by communicating with the patient and family/caregiver during the visit  2) by review  of pertinent vital sign data, physical examination and labs as documented  in the assessment and plan  3) by review of pertinent imaging - none today  4) by review of pertinent procedures - none today  5) by obtaining and reviewing separately obtained information from family/caretaker and Care Everywhere - none today  6) by ordering medications  7) by ordering tests  8) by documenting all of this clinical information in the EHR including the management of each problem noted today in assessment and plan   There are no preventive care reminders to display for this patient.  There are no preventive care reminders to display for this patient.  Lab Results  Component Value Date   TSH 4.65 (H) 01/11/2019   Lab Results  Component Value Date   WBC 10.9 (H) 01/11/2019   HGB 15.1 (H) 01/11/2019   HCT 46.4 (H) 01/11/2019   MCV 85.7 01/11/2019   PLT 296.0 01/11/2019   Lab Results  Component Value Date   NA 137 01/11/2019   K 4.4 01/11/2019   CO2 27 01/11/2019   GLUCOSE 111 (H) 01/11/2019   BUN 15 01/11/2019   CREATININE 0.70 01/11/2019   BILITOT 0.4 01/11/2019   ALKPHOS 120 (H) 01/11/2019   AST 16 01/11/2019   ALT 16 01/11/2019   PROT 8.2 01/11/2019   ALBUMIN 4.5 01/11/2019   CALCIUM 9.5 01/11/2019   ANIONGAP 8 12/03/2013   GFR 81.84 01/11/2019   Lab Results  Component Value Date   CHOL 292 (H) 01/11/2019   Lab Results  Component Value Date   HDL 60.70 01/11/2019   Lab Results  Component Value Date   LDLCALC 193 (H) 07/21/2013   Lab Results  Component Value Date   TRIG 247.0 (H) 01/11/2019   Lab Results  Component Value Date   CHOLHDL 5 01/11/2019   Lab Results  Component Value Date   HGBA1C 5.8 01/11/2019     Problem List Items Addressed This Visit      High   Dyspnea - Primary    I suspect related to obesity and deconditioning, but has hx of aneurysm, unable to tolerate f/u MRI, so for echo.        Relevant Orders   ECHOCARDIOGRAM COMPLETE     Medium   Impaired glucose tolerance    stable overall by history and  exam, recent data reviewed with pt, and pt to continue medical treatment as before,  to f/u any worsening symptoms or concerns Lab Results  Component Value Date   HGBA1C 5.8 01/11/2019        Relevant Orders   Hemoglobin A1c   Hypothyroidism    stable overall by history and exam, recent data reviewed with pt, and pt to continue medical treatment as before,  to f/u any worsening symptoms or concerns Lab Results  Component Value Date   TSH 4.65 (H) 01/11/2019        Relevant Medications   levothyroxine (SYNTHROID) 88 MCG tablet   Hypertension    stable overall by history and exam, recent data reviewed with pt, and pt to continue medical treatment as before,  to f/u any worsening symptoms or concerns BP Readings from Last 3 Encounters:  02/27/20 120/82  01/11/19 128/86  11/26/17 132/78        Relevant Medications   pravastatin (PRAVACHOL) 80 MG tablet   Hyperlipidemia    Lab Results  Component Value Date   LDLCALC 193 (H) 07/21/2013   For lower chol diet, f/u  lipids, cont med tx - pravachol 80      Relevant Medications   pravastatin (PRAVACHOL) 80 MG tablet   Other Relevant Orders   Lipid panel   Hepatic function panel   CBC with Differential/Platelet   TSH   Urinalysis, Routine w reflex microscopic   Basic metabolic panel   Bilateral sciatica    For gabapentin 300 - 600 qhs prn      Relevant Medications   gabapentin (NEURONTIN) 300 MG capsule   PARoxetine (PAXIL) 30 MG tablet    Other Visit Diagnoses    Vitamin D deficiency       Relevant Orders   VITAMIN D 25 Hydroxy (Vit-D Deficiency, Fractures)   B12 deficiency       Relevant Orders   Vitamin B12      Meds ordered this encounter  Medications  . Cholecalciferol (THERA-D 2000) 50 MCG (2000 UT) TABS    Sig: 1 tab by mouth epr day    Dispense:  30 tablet    Refill:  99  . gabapentin (NEURONTIN) 300 MG capsule    Sig: 1-2 tab by mouth at bedtime    Dispense:  180 capsule    Refill:  1  .  levothyroxine (SYNTHROID) 88 MCG tablet    Sig: Take 1 tablet (88 mcg total) by mouth daily.    Dispense:  90 tablet    Refill:  3  . pantoprazole (PROTONIX) 40 MG tablet    Sig: Take 1 tablet (40 mg total) by mouth daily.    Dispense:  90 tablet    Refill:  3  . PARoxetine (PAXIL) 30 MG tablet    Sig: Take 1 tablet (30 mg total) by mouth daily.    Dispense:  90 tablet    Refill:  3  . pravastatin (PRAVACHOL) 80 MG tablet    Sig: Take 1 tablet (80 mg total) by mouth daily.    Dispense:  90 tablet    Refill:  3  . traMADol (ULTRAM) 50 MG tablet    Sig: Take 1 tablet (50 mg total) by mouth every 6 (six) hours as needed.    Dispense:  120 tablet    Refill:  5    Not to exceed 3 additional fills before 04/03/2020.    Follow-up: Return in about 6 months (around 08/26/2020).   Oliver Barre, MD 02/27/2020 4:24 PM [facility]

## 2020-02-27 NOTE — Assessment & Plan Note (Signed)
For gabapentin 300 - 600 qhs prn

## 2020-02-27 NOTE — Assessment & Plan Note (Signed)
stable overall by history and exam, recent data reviewed with pt, and pt to continue medical treatment as before,  to f/u any worsening symptoms or concerns BP Readings from Last 3 Encounters:  02/27/20 120/82  01/11/19 128/86  11/26/17 132/78

## 2020-03-28 ENCOUNTER — Other Ambulatory Visit (HOSPITAL_COMMUNITY): Payer: Medicare Other

## 2020-04-17 ENCOUNTER — Other Ambulatory Visit: Payer: Self-pay | Admitting: Internal Medicine

## 2020-04-18 ENCOUNTER — Ambulatory Visit (HOSPITAL_COMMUNITY): Payer: Medicare Other | Attending: Cardiology

## 2020-04-19 ENCOUNTER — Encounter (HOSPITAL_COMMUNITY): Payer: Self-pay | Admitting: Internal Medicine

## 2020-05-16 ENCOUNTER — Other Ambulatory Visit (HOSPITAL_COMMUNITY): Payer: Medicare Other

## 2020-05-18 ENCOUNTER — Telehealth (HOSPITAL_COMMUNITY): Payer: Self-pay | Admitting: Internal Medicine

## 2020-05-18 NOTE — Telephone Encounter (Signed)
Just an FYI. We have made several attempts to contact this patient including sending a letter to schedule or reschedule their echocardiogram. We will be removing the patient from the echo WQ.   05/15/20 [facility] to reschedule @7 :59/[facility]  04/18/20 NO SHOWED -MAILED LETTER [facility]  03/02/20 [facility] to schedule @ 8:56/[facility]  02/28/20 [facility] to schedule @ 9:26am/[facility]      Thank you

## 2020-08-14 ENCOUNTER — Other Ambulatory Visit (HOSPITAL_COMMUNITY): Payer: Medicare Other

## 2020-08-24 ENCOUNTER — Other Ambulatory Visit: Payer: Self-pay | Admitting: Internal Medicine

## 2020-09-04 ENCOUNTER — Telehealth: Payer: Self-pay | Admitting: Internal Medicine

## 2020-09-04 ENCOUNTER — Encounter: Payer: Self-pay | Admitting: Internal Medicine

## 2020-09-04 ENCOUNTER — Ambulatory Visit: Payer: Medicare Other | Admitting: Internal Medicine

## 2020-09-04 NOTE — Telephone Encounter (Signed)
   Patients daughter is requesting an antibiotic for the patient. She said she thinks the patient might have an abscess. Informed daughter that patient may need an appointment before being prescribed medication.   [id number] - [place], [place] - [address]

## 2020-09-04 NOTE — Telephone Encounter (Signed)
Ok for ROV 

## 2020-09-05 NOTE — Telephone Encounter (Signed)
Please to make ROV 

## 2020-09-05 NOTE — Telephone Encounter (Signed)
Called to schedule and appointment the patient will call office back to schedule one she has her daughter schedule.

## 2020-09-07 MED ORDER — TRAMADOL HCL 50 MG PO TABS: 50.0000 mg | ORAL_TABLET | Freq: Four times a day (QID) | ORAL | 5 refills | Status: DC | PRN

## 2020-09-07 NOTE — Telephone Encounter (Signed)
-----   Message from [place], [place] sent at 09/07/2020 10:41 AM EDT ----- New Rx is needed for tramadol as the previous has expired.   ----- Message ----- From: Judieth Keens D Sent: 09/06/2020   3:37 PM EDT To: Casper Harrison, CMA  Refill request

## 2020-09-10 ENCOUNTER — Ambulatory Visit (HOSPITAL_COMMUNITY): Admission: RE | Admit: 2020-09-10 | Payer: Medicare Other | Source: Ambulatory Visit

## 2020-09-12 ENCOUNTER — Ambulatory Visit: Payer: Medicare Other | Admitting: Internal Medicine

## 2020-09-18 ENCOUNTER — Encounter (HOSPITAL_COMMUNITY): Payer: Self-pay | Admitting: Internal Medicine

## 2020-09-25 ENCOUNTER — Ambulatory Visit: Payer: Medicare Other | Admitting: Internal Medicine

## 2020-10-02 ENCOUNTER — Telehealth (HOSPITAL_COMMUNITY): Payer: Self-pay | Admitting: Internal Medicine

## 2020-10-02 NOTE — Telephone Encounter (Signed)
Just an FYI. We have made several attempts to contact this patient including sending a letter to schedule or reschedule their echocardiogram. We will be removing the patient from the echo WQ.  09/11/20 NO SHOWED-MAILED LETTER [facility]  05/15/2020 7:59 AM By:  By: Jabier Gauss  Cancel Rsn: Patient (patient daughter cancelled and needs to reschedule.)  05/15/20 [facility] to reschedule @7 :59/LB[facility]  04/18/20 NO SHOWED -MAILED LETTER  [facility]  03/02/20 [facility] to schedule @ 8:56/[facility]  02/28/20 [facility] to schedule @ 9:26am/[facility]  09/11/20 No show         Thank you

## 2020-10-03 IMAGING — MR MR MRA CHEST W/ OR W/O CM
7 series · 16 of 16 positions shown · IV contrast (agent unspecified)
Comparison: Chest CT-11/06/2017

CLINICAL DATA: Evaluate thoracic aortic aneurysm.

EXAM:
MRA CHEST WITHOUT CONTRAST
TECHNIQUE: Angiographic images of the chest were obtained using MRA technique
without intravenous contrast.
CONTRAST:  None - patient refused intravenous contrast.

[Series 2: bSSFP · axial · 6.0mm · 1.48mm/px · 1 of 45 slices shown (1 of 2)]
[im 1/45]
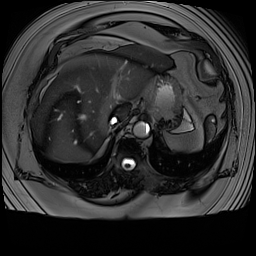

[Series 3: axial haste · axial · 5.0mm · 0.74mm/px · z∈[-120,+125]mm · 2 of 50 slices shown]
[im 1/50]
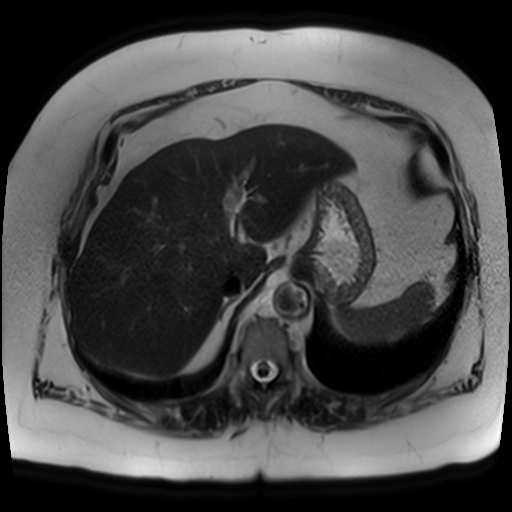
[im 50/50]
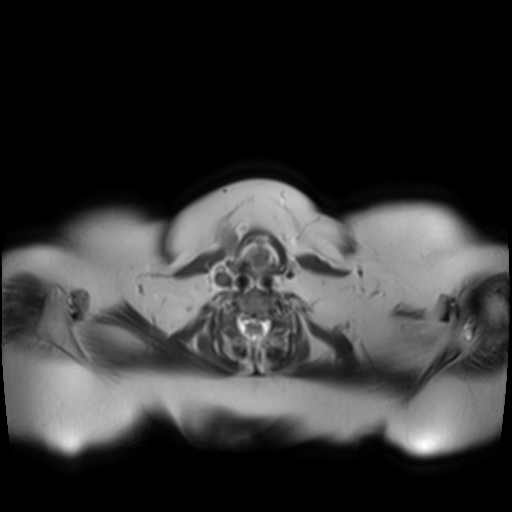

[Series 5: T1 dynamic · axial · non-contrast · 2.5mm · 1.56mm/px · z∈[-107,+111]mm · 4 of 88 slices shown]
[im 1/88]
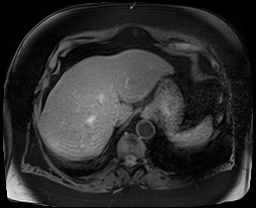
[im 30/88]
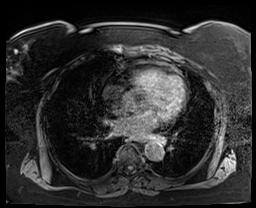
[im 59/88]
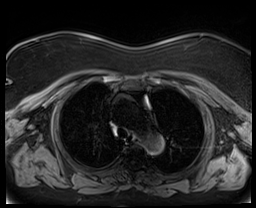
[im 88/88]
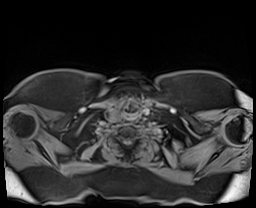

[Series 6: T1 fat-sat · axial · 5.0mm · 1.48mm/px · z∈[-109,+113]mm · 2 of 38 slices shown]
[im 1/38]
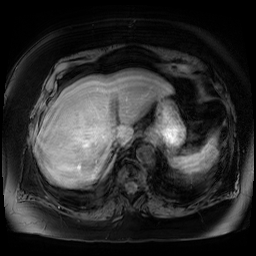
[im 38/38]
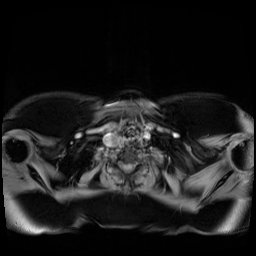

[Series 7: bSSFP · sagittal · 4.0mm · 1.56mm/px · 1 of 33 slices shown (2 of 2)]
[im 1/33]
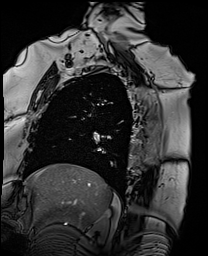

[Series 8: vessel_scout · sagittal · 3.0mm · 1.56mm/px · 2 of 50 slices shown]
[im 1/50]
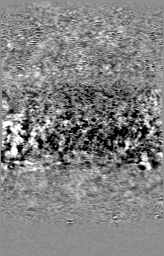
[im 50/50]
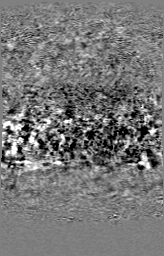

[Series 10: cor flash pre · coronal · non-contrast · 1.5mm · 1.04mm/px · 4 of 88 slices shown]
[im 1/88]
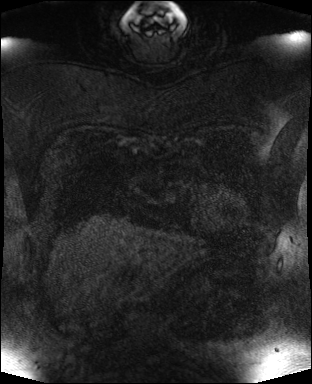
[im 30/88]
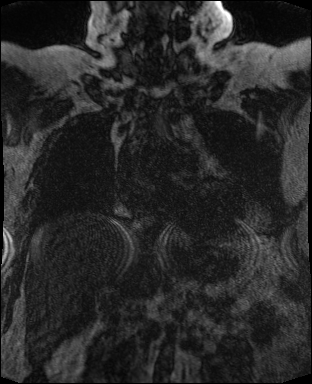
[im 59/88]
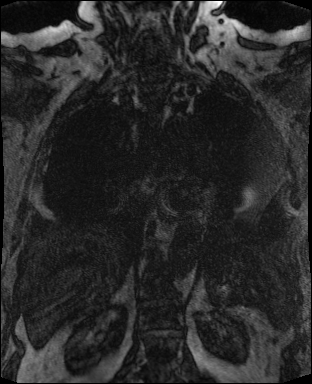
[im 88/88]
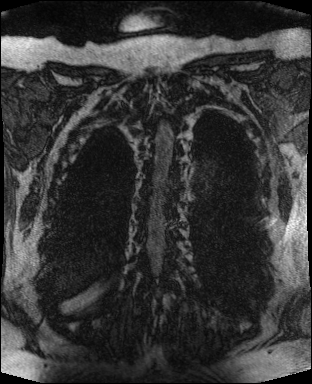

[16 of 16 positions shown; findings below may reference images not displayed]

FINDINGS: Examination is degraded due to lack of intravenous contrast in
further degraded secondary to patient respiratory artifact.

Vascular Findings:

There is grossly unchanged fusiform aneurysmal dilatation the
ascending thoracic aorta with measurements as follows. The thoracic
aorta tapers to a normal caliber at the level of the aortic arch. No
evidence of intramural hematoma. No evidence of thoracic aortic
dissection or periaortic stranding on this nongated examination.

Conventional configuration of the aortic arch, however there is
suboptimal evaluation of the arch vessels due to lack of intravenous
contrast and patient motion.

Normal heart size. No pericardial effusion. Normal caliber of the
main pulmonary artery.

-------------------------------------------------------------

Thoracic aortic measurements:

Sinotubular junction

Suboptimally evaluated due to cardiac and respiratory artifact.

Proximal ascending aorta

51 mm as measured in greatest oblique short axis axial dimension at
the level of the main pulmonary artery (image 26, series 2; image
26, series 3), unchanged compared to the [DATE] examination

Aortic arch aorta

31 mm as measured in greatest oblique short axis sagittal dimension
(image 17, series 7).

Proximal descending thoracic aorta

32 mm as measured in greatest oblique short axis axial dimension at
the level of the main pulmonary artery.

Distal descending thoracic aorta

29 mm as measured in greatest oblique short axis axial dimension at
the level of the diaphragmatic hiatus.

Review of the MIP images confirms the above findings.

-------------------------------------------------------------

Non-Vascular Findings:

Mediastinum/Lymph Nodes: No bulky mediastinal, hilar axillary
lymphadenopathy.

Lungs/Pleura: No discrete focal airspace opacities. No pleural
effusion.

Upper abdomen: Limited noncontrast evaluation of the upper abdomen
is unremarkable.

Musculoskeletal: No definite aggressive osseous abnormalities.
IMPRESSION: Stable uncomplicated aneurysmal dilatation of the ascending thoracic
aorta measuring 51 mm in diameter, unchanged compared to chest CT
performed [DATE].

Recommend semi-annual imaging followup by CTA or MRA. This
recommendation follows 7797
ACCF/AHA/AATS/ACR/ASA/SCA/LABELLE SALHA/YOSHINAGA/ZARIOH/DOMACA BASTA Guidelines for the
Diagnosis and Management of Patients With Thoracic Aortic Disease.
Circulation. 7797; 121: [id number]. Aortic aneurysm NOS ([id number].5)

## 2020-10-09 ENCOUNTER — Ambulatory Visit: Payer: Medicare Other | Admitting: Internal Medicine

## 2020-10-10 ENCOUNTER — Other Ambulatory Visit: Payer: Self-pay | Admitting: Internal Medicine

## 2020-10-10 ENCOUNTER — Telehealth: Payer: Self-pay | Admitting: Internal Medicine

## 2020-10-10 NOTE — Telephone Encounter (Signed)
Unable to leave a message for patient to call me back at [phone] to schedule Medicare Annual Wellness Visit   No hx of AWV eligible as of 02/25/11  Please schedule at anytime with [facility] Advisor if patient calls the office back.    45 Minutes appointment   Any questions, please call me at [phone]

## 2020-10-17 ENCOUNTER — Other Ambulatory Visit: Payer: Self-pay

## 2020-10-17 ENCOUNTER — Ambulatory Visit (HOSPITAL_COMMUNITY)
Admission: RE | Admit: 2020-10-17 | Discharge: 2020-10-17 | Disposition: A | Discharge status: Home / Self Care | Payer: Medicare Other | Source: Ambulatory Visit | Attending: Internal Medicine | Admitting: Internal Medicine

## 2020-10-17 DIAGNOSIS — I10 Essential (primary) hypertension: Secondary | ICD-10-CM | POA: Insufficient documentation

## 2020-10-17 DIAGNOSIS — R0609 Other forms of dyspnea: Secondary | ICD-10-CM | POA: Diagnosis not present

## 2020-10-17 DIAGNOSIS — E785 Hyperlipidemia, unspecified: Secondary | ICD-10-CM | POA: Insufficient documentation

## 2020-10-17 LAB — ECHOCARDIOGRAM COMPLETE
Area-P 1/2: 3.31 cm2
S' Lateral: 3 cm

## 2020-10-17 NOTE — Progress Notes (Signed)
  Echocardiogram 2D Echocardiogram has been performed.  [name] 10/17/2020, 2:16 PM

## 2020-10-19 ENCOUNTER — Other Ambulatory Visit: Payer: Self-pay

## 2020-10-19 ENCOUNTER — Encounter: Payer: Self-pay | Admitting: Internal Medicine

## 2020-10-19 ENCOUNTER — Telehealth (INDEPENDENT_AMBULATORY_CARE_PROVIDER_SITE_OTHER): Payer: Medicare Other | Admitting: Internal Medicine

## 2020-10-19 DIAGNOSIS — I712 Thoracic aortic aneurysm, without rupture, unspecified: Secondary | ICD-10-CM

## 2020-10-19 DIAGNOSIS — I77810 Thoracic aortic ectasia: Secondary | ICD-10-CM

## 2020-10-19 MED ORDER — HYDROCODONE-ACETAMINOPHEN 5-325 MG PO TABS: ORAL_TABLET | ORAL | 0 refills | Status: DC

## 2020-10-19 NOTE — Progress Notes (Signed)
Patient ID: Jenny Acosta, female   DOB: 05-06-1945, 75 y.o.   MRN: [id number]  Cumulative time during 7-day interval 12 min, there was not an associated office visit for this concern within a 7 day period.  Verbal consent for services obtained from patient prior to services given.  Names of all persons present for services: Oliver Barre, MD, patient  Chief complaint: aortic dilation  History, background, results pertinent:  Here to f/u above , Pt denies chest pain, increased sob or doe, wheezing, orthopnea, PND, increased LE swelling, palpitations, dizziness or syncope.   Pt denies polydipsia, polyuria, or new focal neuro s/s.  Did have aortic dilation noted on recent echo 43 mm.  Last MR angio Chest w/o cm dec 2020  - 51 mm aortic dilation  Pt hoping this meant she was improved.  Has seen chest surgury with recommendation for f/u MR angio at 6 mo but not done.    Past Medical History:  Diagnosis Date   Allergic rhinitis, cause unspecified    Arthritis    Carotid stenosis    Family history of colon cancer 07/21/2013   mother   Gallstone    GERD (gastroesophageal reflux disease)    Hyperlipidemia 08/30/2010   Hypertension    Hypothyroidism    Impaired glucose tolerance    Pain, coccyx    Right lumbar radiculopathy 08/30/2010   No results found for this or any previous visit (from the past 48 hour(s)).  A/P/next steps:   1) aortic dilation - pt now for f/u mr angio chest wo cm to r/o worsening and consider f/u chest surgury  Oliver Barre MD

## 2020-10-21 ENCOUNTER — Encounter: Payer: Self-pay | Admitting: Internal Medicine

## 2020-10-21 DIAGNOSIS — I77810 Thoracic aortic ectasia: Secondary | ICD-10-CM | POA: Insufficient documentation

## 2020-10-21 DIAGNOSIS — I7121 Aneurysm of the ascending aorta, without rupture: Secondary | ICD-10-CM | POA: Insufficient documentation

## 2020-10-21 NOTE — Assessment & Plan Note (Signed)
See notes

## 2020-10-21 NOTE — Patient Instructions (Signed)
You will be contacted regarding the referral for: MR angio chest w/o CM

## 2020-12-11 ENCOUNTER — Other Ambulatory Visit: Payer: Self-pay | Admitting: Internal Medicine

## 2021-01-11 ENCOUNTER — Other Ambulatory Visit: Payer: Self-pay | Admitting: Internal Medicine

## 2021-01-24 ENCOUNTER — Other Ambulatory Visit: Payer: Self-pay | Admitting: Internal Medicine

## 2021-03-16 ENCOUNTER — Other Ambulatory Visit: Payer: Self-pay | Admitting: Internal Medicine

## 2021-04-11 ENCOUNTER — Other Ambulatory Visit: Payer: Self-pay | Admitting: Internal Medicine

## 2021-04-11 ENCOUNTER — Encounter: Payer: Self-pay | Admitting: Internal Medicine

## 2021-04-11 DIAGNOSIS — E039 Hypothyroidism, unspecified: Secondary | ICD-10-CM

## 2021-04-11 MED ORDER — TRAMADOL HCL 50 MG PO TABS: 50.0000 mg | ORAL_TABLET | Freq: Four times a day (QID) | ORAL | 2 refills | Status: DC | PRN

## 2021-04-11 MED ORDER — NORTRIPTYLINE HCL 10 MG PO CAPS: ORAL_CAPSULE | ORAL | 2 refills | Status: DC

## 2021-04-14 ENCOUNTER — Other Ambulatory Visit: Payer: Self-pay | Admitting: Internal Medicine

## 2021-04-15 ENCOUNTER — Other Ambulatory Visit: Payer: Self-pay | Admitting: Internal Medicine

## 2021-04-29 ENCOUNTER — Other Ambulatory Visit: Payer: Self-pay | Admitting: Internal Medicine

## 2021-05-05 ENCOUNTER — Encounter: Payer: Self-pay | Admitting: Internal Medicine

## 2021-05-06 ENCOUNTER — Ambulatory Visit: Payer: Medicare Other | Admitting: Internal Medicine

## 2021-05-10 ENCOUNTER — Other Ambulatory Visit: Payer: Self-pay | Admitting: Internal Medicine

## 2021-05-27 ENCOUNTER — Encounter: Payer: Self-pay | Admitting: Internal Medicine

## 2021-05-27 ENCOUNTER — Ambulatory Visit (INDEPENDENT_AMBULATORY_CARE_PROVIDER_SITE_OTHER): Payer: Medicare Other | Admitting: Internal Medicine

## 2021-05-27 ENCOUNTER — Ambulatory Visit (INDEPENDENT_AMBULATORY_CARE_PROVIDER_SITE_OTHER): Payer: Medicare Other

## 2021-05-27 VITALS — BP 142/78 | HR 102 | Temp 98.1°F | Ht 64.0 in | Wt 246.0 lb

## 2021-05-27 DIAGNOSIS — E7849 Other hyperlipidemia: Secondary | ICD-10-CM | POA: Diagnosis not present

## 2021-05-27 DIAGNOSIS — Z0000 Encounter for general adult medical examination without abnormal findings: Secondary | ICD-10-CM | POA: Diagnosis not present

## 2021-05-27 DIAGNOSIS — E039 Hypothyroidism, unspecified: Secondary | ICD-10-CM | POA: Diagnosis not present

## 2021-05-27 DIAGNOSIS — H9193 Unspecified hearing loss, bilateral: Secondary | ICD-10-CM | POA: Insufficient documentation

## 2021-05-27 DIAGNOSIS — G4710 Hypersomnia, unspecified: Secondary | ICD-10-CM

## 2021-05-27 DIAGNOSIS — H9191 Unspecified hearing loss, right ear: Secondary | ICD-10-CM

## 2021-05-27 DIAGNOSIS — K0510 Chronic gingivitis, plaque induced: Secondary | ICD-10-CM

## 2021-05-27 DIAGNOSIS — I10 Essential (primary) hypertension: Secondary | ICD-10-CM | POA: Diagnosis not present

## 2021-05-27 DIAGNOSIS — E538 Deficiency of other specified B group vitamins: Secondary | ICD-10-CM | POA: Diagnosis not present

## 2021-05-27 DIAGNOSIS — R7302 Impaired glucose tolerance (oral): Secondary | ICD-10-CM | POA: Diagnosis not present

## 2021-05-27 DIAGNOSIS — E559 Vitamin D deficiency, unspecified: Secondary | ICD-10-CM | POA: Diagnosis not present

## 2021-05-27 LAB — CBC WITH DIFFERENTIAL/PLATELET
Basophils Absolute: 0.1 10*3/uL (ref 0.0–0.1)
Basophils Relative: 1 % (ref 0.0–3.0)
Eosinophils Absolute: 0.2 10*3/uL (ref 0.0–0.7)
Eosinophils Relative: 2.1 % (ref 0.0–5.0)
HCT: 45.9 % (ref 36.0–46.0)
Hemoglobin: 15.2 g/dL — ABNORMAL HIGH (ref 12.0–15.0)
Lymphocytes Relative: 49.6 % — ABNORMAL HIGH (ref 12.0–46.0)
Lymphs Abs: 5 10*3/uL — ABNORMAL HIGH (ref 0.7–4.0)
MCHC: 33.1 g/dL (ref 30.0–36.0)
MCV: 86.5 fl (ref 78.0–100.0)
Monocytes Absolute: 0.8 10*3/uL (ref 0.1–1.0)
Monocytes Relative: 7.9 % (ref 3.0–12.0)
Neutro Abs: 4 10*3/uL (ref 1.4–7.7)
Neutrophils Relative %: 39.4 % — ABNORMAL LOW (ref 43.0–77.0)
Platelets: 258 10*3/uL (ref 150.0–400.0)
RBC: 5.31 Mil/uL — ABNORMAL HIGH (ref 3.87–5.11)
RDW: 15 % (ref 11.5–15.5)
WBC: 10.1 10*3/uL (ref 4.0–10.5)

## 2021-05-27 LAB — T4, FREE: Free T4: 0.9 ng/dL (ref 0.60–1.60)

## 2021-05-27 LAB — TSH: TSH: 4.32 u[IU]/mL (ref 0.35–5.50)

## 2021-05-27 LAB — VITAMIN B12: Vitamin B-12: 777 pg/mL (ref 211–911)

## 2021-05-27 MED ORDER — AMOXICILLIN 500 MG PO CAPS: 1000.0000 mg | ORAL_CAPSULE | Freq: Two times a day (BID) | ORAL | 0 refills | Status: AC

## 2021-05-27 NOTE — Patient Instructions (Signed)
Your right ear was irrigated today ? ?You will be contacted regarding the referral for: Pulmonary ? ?Please take all new medication as prescribed - the antibiotic ? ?Please continue all other medications as before, and refills have been done if requested. ? ?Please have the pharmacy call with any other refills you may need. ? ?Please continue your efforts at being more active, low cholesterol diet, and weight control. ? ?You are otherwise up to date with prevention measures today. ? ?Please keep your appointments with your specialists as you may have planned ? ?Please go to the LAB at the blood drawing area for the tests to be done ? ?You will be contacted by phone if any changes need to be made immediately.  Otherwise, you will receive a letter about your results with an explanation, but please check with MyChart first. ? ?Please remember to sign up for MyChart if you have not done so, as this will be important to you in the future with finding out test results, communicating by private email, and scheduling acute appointments online when needed. ? ?Please make an Appointment to return in 6 months, or sooner if needed ?

## 2021-05-27 NOTE — Assessment & Plan Note (Signed)
Improved s/p irrigation,  ° °Ceruminosis is noted.  Wax is removed by syringing and manual debridement. Instructions for home care to prevent wax buildup are given. ° °

## 2021-05-27 NOTE — Patient Instructions (Signed)
Jenny Acosta , ?Thank you for taking time to come for your Medicare Wellness Visit. I appreciate your ongoing commitment to your health goals. Please review the following plan we discussed and let me know if I can assist you in the future.  ? ?Screening recommendations/referrals: ?Colonoscopy: declined ?Mammogram: declined ?Bone Density: declined ?Recommended yearly ophthalmology/optometry visit for glaucoma screening and checkup ?Recommended yearly dental visit for hygiene and checkup ? ?Vaccinations: ?Influenza vaccine: declined ?Pneumococcal vaccine: 07/21/2013, 08/30/2010 ?Tdap vaccine: 01/06/2017; due every 10 years ?Shingles vaccine: never done   ?Covid-19: 11/11/2019, 12/09/2019; declined boosters ? ?Advanced directives: Advance directive discussed with you today. Even though you declined this today please call our office should you change your mind and we can give you the proper paperwork for you to fill out. ? ?Conditions/risks identified: Yes ? ?Next appointment: Please schedule your next Medicare Wellness Visit with your Nurse Health Advisor in 1 year or 366 days by calling [phone]. ? ? ?Preventive Care 76 Years and Older, Female ?Preventive care refers to lifestyle choices and visits with your health care provider that can promote health and wellness. ?What does preventive care include? ?A yearly physical exam. This is also called an annual well check. ?Dental exams once or twice a year. ?Routine eye exams. Ask your health care provider how often you should have your eyes checked. ?Personal lifestyle choices, including: ?Daily care of your teeth and gums. ?Regular physical activity. ?Eating a healthy diet. ?Avoiding tobacco and drug use. ?Limiting alcohol use. ?Practicing safe sex. ?Taking low-dose aspirin every day. ?Taking vitamin and mineral supplements as recommended by your health care provider. ?What happens during an annual well check? ?The services and screenings done by your health care provider  during your annual well check will depend on your age, overall health, lifestyle risk factors, and family history of disease. ?Counseling  ?Your health care provider may ask you questions about your: ?Alcohol use. ?Tobacco use. ?Drug use. ?Emotional well-being. ?Home and relationship well-being. ?Sexual activity. ?Eating habits. ?History of falls. ?Memory and ability to understand (cognition). ?Work and work Astronomer. ?Reproductive health. ?Screening  ?You may have the following tests or measurements: ?Height, weight, and BMI. ?Blood pressure. ?Lipid and cholesterol levels. These may be checked every 5 years, or more frequently if you are over 58 years old. ?Skin check. ?Lung cancer screening. You may have this screening every year starting at age 26 if you have a 30-pack-year history of smoking and currently smoke or have quit within the past 15 years. ?Fecal occult blood test (FOBT) of the stool. You may have this test every year starting at age 71. ?Flexible sigmoidoscopy or colonoscopy. You may have a sigmoidoscopy every 5 years or a colonoscopy every 10 years starting at age 64. ?Hepatitis C blood test. ?Hepatitis B blood test. ?Sexually transmitted disease (STD) testing. ?Diabetes screening. This is done by checking your blood sugar (glucose) after you have not eaten for a while (fasting). You may have this done every 1-3 years. ?Bone density scan. This is done to screen for osteoporosis. You may have this done starting at age 40. ?Mammogram. This may be done every 1-2 years. Talk to your health care provider about how often you should have regular mammograms. ?Talk with your health care provider about your test results, treatment options, and if necessary, the need for more tests. ?Vaccines  ?Your health care provider may recommend certain vaccines, such as: ?Influenza vaccine. This is recommended every year. ?Tetanus, diphtheria, and acellular pertussis (Tdap, Td) vaccine.  You may need a Td booster every  10 years. ?Zoster vaccine. You may need this after age 41. ?Pneumococcal 13-valent conjugate (PCV13) vaccine. One dose is recommended after age 57. ?Pneumococcal polysaccharide (PPSV23) vaccine. One dose is recommended after age 40. ?Talk to your health care provider about which screenings and vaccines you need and how often you need them. ?This information is not intended to replace advice given to you by your health care provider. Make sure you discuss any questions you have with your health care provider. ?Document Released: 03/09/2015 Document Revised: 10/31/2015 Document Reviewed: 12/12/2014 ?Elsevier Interactive Patient Education ? 2017 [place]. ? ?Fall Prevention in the Home ?Falls can cause injuries. They can happen to people of all ages. There are many things you can do to make your home safe and to help prevent falls. ?What can I do on the outside of my home? ?Regularly fix the edges of walkways and driveways and fix any cracks. ?Remove anything that might make you trip as you walk through a door, such as a raised step or threshold. ?Trim any bushes or trees on the path to your home. ?Use bright outdoor lighting. ?Clear any walking paths of anything that might make someone trip, such as rocks or tools. ?Regularly check to see if handrails are loose or broken. Make sure that both sides of any steps have handrails. ?Any raised decks and porches should have guardrails on the edges. ?Have any leaves, snow, or ice cleared regularly. ?Use sand or salt on walking paths during winter. ?Clean up any spills in your garage right away. This includes oil or grease spills. ?What can I do in the bathroom? ?Use night lights. ?Install grab bars by the toilet and in the tub and shower. Do not use towel bars as grab bars. ?Use non-skid mats or decals in the tub or shower. ?If you need to sit down in the shower, use a plastic, non-slip stool. ?Keep the floor dry. Clean up any water that spills on the floor as soon as it  happens. ?Remove soap buildup in the tub or shower regularly. ?Attach bath mats securely with double-sided non-slip rug tape. ?Do not have throw rugs and other things on the floor that can make you trip. ?What can I do in the bedroom? ?Use night lights. ?Make sure that you have a light by your bed that is easy to reach. ?Do not use any sheets or blankets that are too big for your bed. They should not hang down onto the floor. ?Have a firm chair that has side arms. You can use this for support while you get dressed. ?Do not have throw rugs and other things on the floor that can make you trip. ?What can I do in the kitchen? ?Clean up any spills right away. ?Avoid walking on wet floors. ?Keep items that you use a lot in easy-to-reach places. ?If you need to reach something above you, use a strong step stool that has a grab bar. ?Keep electrical cords out of the way. ?Do not use floor polish or wax that makes floors slippery. If you must use wax, use non-skid floor wax. ?Do not have throw rugs and other things on the floor that can make you trip. ?What can I do with my stairs? ?Do not leave any items on the stairs. ?Make sure that there are handrails on both sides of the stairs and use them. Fix handrails that are broken or loose. Make sure that handrails are as long as  the stairways. ?Check any carpeting to make sure that it is firmly attached to the stairs. Fix any carpet that is loose or worn. ?Avoid having throw rugs at the top or bottom of the stairs. If you do have throw rugs, attach them to the floor with carpet tape. ?Make sure that you have a light switch at the top of the stairs and the bottom of the stairs. If you do not have them, ask someone to add them for you. ?What else can I do to help prevent falls? ?Wear shoes that: ?Do not have high heels. ?Have rubber bottoms. ?Are comfortable and fit you well. ?Are closed at the toe. Do not wear sandals. ?If you use a stepladder: ?Make sure that it is fully opened.  Do not climb a closed stepladder. ?Make sure that both sides of the stepladder are locked into place. ?Ask someone to hold it for you, if possible. ?Clearly mark and make sure that you can see: ?Any

## 2021-05-27 NOTE — Assessment & Plan Note (Signed)
BP Readings from Last 3 Encounters:  ?05/27/21 (!) 142/78  ?05/27/21 (!) 142/78  ?02/27/20 120/82  ? ?Mild uncontrolled, declines change in tx for now, states BP athome < 140/90, pt to continue low salt diet, wt control ? ?

## 2021-05-27 NOTE — Assessment & Plan Note (Signed)
Lab Results  ?Component Value Date  ? LDLCALC 193 (H) 07/21/2013  ? ?Uncontrolled, for f/u lab today, goal ldl < 100, continue pravachol 80 for now ? ?

## 2021-05-27 NOTE — Assessment & Plan Note (Signed)
Lab Results  ?Component Value Date  ? HGBA1C 5.8 01/11/2019  ? ?Stable, pt to continue current medical treatment  - diet ?

## 2021-05-27 NOTE — Assessment & Plan Note (Signed)
Last vitamin D ?Lab Results  ?Component Value Date  ? VD25OH 22.56 (L) 01/11/2019  ? ?Low, to start oral replacement ? ?

## 2021-05-27 NOTE — Assessment & Plan Note (Signed)
Mild to mod, for antibx course,  to f/u any worsening symptoms or concerns 

## 2021-05-27 NOTE — Assessment & Plan Note (Signed)
Lab Results  ?Component Value Date  ? TSH 4.65 (H) 01/11/2019  ? ?uncontrolled, pt to continue levothyroxine and f/u lab ? ? ?

## 2021-05-27 NOTE — Progress Notes (Signed)
? ?Subjective:  ? Jenny Acosta is a 76 y.o. female who presents for Medicare Annual (Subsequent) preventive examination. ? ?Review of Systems    ? ?Cardiac Risk Factors include: advanced age (>79men, >66 women);dyslipidemia;hypertension;obesity (BMI >30kg/m2);family history of premature cardiovascular disease ? ?   ?Objective:  ?  ?Today's Vitals  ? 05/27/21 1523  ?BP: (!) 142/78  ?Pulse: (!) 102  ?Temp: 98.1 ?F (36.7 ?C)  ?SpO2: 94%  ?Weight: 246 lb (111.6 kg)  ?Height: 5\' 4"  (1.626 m)  ? ?Body mass index is 42.23 kg/m?. ? ? ?  05/27/2021  ?  3:49 PM  ?Advanced Directives  ?Does Patient Have a Medical Advance Directive? No  ?Would patient like information on creating a medical advance directive? No - Patient declined  ? ? ?Current Medications (verified) ?Outpatient Encounter Medications as of 05/27/2021  ?Medication Sig  ? acetaminophen (TYLENOL) 500 MG tablet Take 500 mg by mouth every 6 (six) hours as needed. For pain  ? Cholecalciferol (THERA-D 2000) 50 MCG (2000 UT) TABS 1 tab by mouth epr day  ? DHA-EPA-Vitamin E (OMEGA-3 COMPLEX PO) Take by mouth.  ? guaiFENesin (MUCUS RELIEF ADULT PO) Take by mouth.  ? levothyroxine (SYNTHROID) 88 MCG tablet TAKE 1 TABLET BY MOUTH EVERY DAY  ? multivitamin-lutein (OCUVITE-LUTEIN) CAPS capsule Take 1 capsule by mouth daily.  ? nortriptyline (PAMELOR) 10 MG capsule 1-3 TABS BY MOUTH AT BEDTIME AS NEEDED FOR PAIN AND SLEEP  ? ondansetron (ZOFRAN) 4 MG tablet TAKE 1 TABLET BY MOUTH EVERY 8 HOURS AS NEEDED FOR NAUSEA AND VOMITING  ? pantoprazole (PROTONIX) 40 MG tablet TAKE 1 TABLET BY MOUTH EVERY DAY  ? PARoxetine (PAXIL) 30 MG tablet TAKE 1 TABLET BY MOUTH EVERY DAY  ? pravastatin (PRAVACHOL) 80 MG tablet Take 1 tablet (80 mg total) by mouth daily. (Patient not taking: Reported on 05/27/2021)  ? tiZANidine (ZANAFLEX) 4 MG tablet TAKE 1 TABLET BY MOUTH THREE TIMES A DAY AS NEEDED  ? traMADol (ULTRAM) 50 MG tablet Take 1 tablet (50 mg total) by mouth every 6 (six) hours as needed.   ? triamcinolone (NASACORT) 55 MCG/ACT AERO nasal inhaler Place 2 sprays into the nose daily. (Patient not taking: Reported on 05/27/2021)  ? Triprolidine-Pseudoephedrine (ANTIHISTAMINE PO) Take by mouth.  ? [DISCONTINUED] HYDROcodone-acetaminophen (NORCO/VICODIN) 5-325 MG tablet 1 tab by mouth one hour prior to procedure x 1  ? ?No facility-administered encounter medications on file as of 05/27/2021.  ? ? ?Allergies (verified) ?Lipitor [atorvastatin], Methocarbamol, Simvastatin, and Sulfa antibiotics  ? ?History: ?Past Medical History:  ?Diagnosis Date  ? Allergic rhinitis, cause unspecified   ? Arthritis   ? Carotid stenosis   ? Family history of colon cancer 07/21/2013  ? mother  ? Gallstone   ? GERD (gastroesophageal reflux disease)   ? Hyperlipidemia 08/30/2010  ? Hypertension   ? Hypothyroidism   ? Impaired glucose tolerance   ? Pain, coccyx   ? Right lumbar radiculopathy 08/30/2010  ? ?Past Surgical History:  ?Procedure Laterality Date  ? APPENDECTOMY  1953  ? CHOLECYSTECTOMY  2002  ? CHOLECYSTECTOMY OPEN  10 years ago  ? scarlet fever    ? age 31  ? TONSILLECTOMY  1959  ? ?Family History  ?Problem Relation Age of Onset  ? Colon cancer Mother   ? Stroke Mother   ? Arthritis Sister   ? Atrial fibrillation Sister   ?     has pacemaker  ? Diabetes Maternal Grandmother   ? Diabetes Daughter   ?  Stomach cancer Neg Hx   ? ?Social History  ? ?Socioeconomic History  ? Marital status: Divorced  ?  Spouse name: Not on file  ? Number of children: Not on file  ? Years of education: Not on file  ? Highest education level: Not on file  ?Occupational History  ? Not on file  ?Tobacco Use  ? Smoking status: Never  ? Smokeless tobacco: Never  ?Substance and Sexual Activity  ? Alcohol use: No  ? Drug use: No  ? Sexual activity: Not on file  ?Other Topics Concern  ? Not on file  ?Social History Narrative  ? Not on file  ? ?Social Determinants of Health  ? ?Financial Resource Strain: Low Risk   ? Difficulty of Paying Living Expenses: Not  hard at all  ?Food Insecurity: No Food Insecurity  ? Worried About Programme researcher, broadcasting/film/videounning Out of Food in the Last Year: Never true  ? Ran Out of Food in the Last Year: Never true  ?Transportation Needs: No Transportation Needs  ? Lack of Transportation (Medical): No  ? Lack of Transportation (Non-Medical): No  ?Physical Activity: Inactive  ? Days of Exercise per Week: 0 days  ? Minutes of Exercise per Session: 0 min  ?Stress: No Stress Concern Present  ? Feeling of Stress : Not at all  ?Social Connections: Unknown  ? Frequency of Communication with Friends and Family: More than three times a week  ? Frequency of Social Gatherings with Friends and Family: More than three times a week  ? Attends Religious Services: Patient refused  ? Active Member of Clubs or Organizations: Patient refused  ? Attends BankerClub or Organization Meetings: Patient refused  ? Marital Status: Divorced  ? ? ?Tobacco Counseling ?Counseling given: Not Answered ? ? ?Clinical Intake: ? ?Pre-visit preparation completed: Yes ? ?Pain : No/denies pain ? ?  ? ?BMI - recorded: 42.23 ?Nutritional Status: BMI > 30  Obese ?Nutritional Risks: None ?Diabetes: No ?CBG done?: No ?Did pt. bring in CBG monitor from home?: No ? ?How often do you need to have someone help you when you read instructions, pamphlets, or other written materials from your doctor or pharmacy?: 1 - Never ?What is the last grade level you completed in school?: Graduate School ? ?Diabetic? no ? ?Interpreter Needed?: No ? ?Information entered by :: Susie CassetteShenika Sama Arauz, LPN ? ? ?Activities of Daily Living ? ?  05/27/2021  ?  3:51 PM  ?In your present state of health, do you have any difficulty performing the following activities:  ?Hearing? 1  ?Vision? 0  ?Difficulty concentrating or making decisions? 1  ?Walking or climbing stairs? 0  ?Dressing or bathing? 1  ?Doing errands, shopping? 1  ?Preparing Food and eating ? N  ?Using the Toilet? N  ?In the past six months, have you accidently leaked urine? Y  ?Do you  have problems with loss of bowel control? N  ?Managing your Medications? N  ?Managing your Finances? N  ?Housekeeping or managing your Housekeeping? Y  ? ? ?Patient Care Team: ?Corwin LevinsJohn, James W, MD as PCP - General (Internal Medicine) ?Pa, [facility](Optometry) ? ?Indicate any recent Medical Services you may have received from other than [facility] providers in the past year (date may be approximate). ? ?   ?Assessment:  ? This is a routine wellness examination for Lurena JoinerRebecca. ? ?Hearing/Vision screen ?Hearing Screening - Comments:: Patient has decreased hearing loss. ?No hearing aids. ?Vision Screening - Comments:: [facility]  ? ?  Dietary issues and exercise activities discussed: ?Current Exercise Habits: The patient does not participate in regular exercise at present, Exercise limited by: neurologic condition(s);orthopedic condition(s) ? ? Goals Addressed   ? ?  ?  ?  ?  ? This Visit's Progress  ?  Patient Stated     ?  To get stronger and lose some weight. ?  ? ?  ?Depression Screen ? ?  05/27/2021  ?  3:22 PM 02/27/2020  ?  4:34 PM 02/27/2020  ?  3:41 PM 01/11/2019  ?  4:14 PM 10/27/2017  ?  2:58 PM 01/06/2017  ?  3:00 PM 10/24/2014  ?  4:43 PM  ?PHQ 2/9 Scores  ?PHQ - 2 Score 4 0 0 1 0 6 0  ?PHQ- 9 Score 16     20   ?  ?Fall Risk ? ?  05/27/2021  ?  3:22 PM 02/27/2020  ?  4:34 PM 02/27/2020  ?  3:40 PM 01/11/2019  ?  4:14 PM 10/27/2017  ?  2:58 PM  ?Fall Risk   ?Falls in the past year? 0 0 0 0 No  ?Number falls in past yr: 0  0    ?Injury with Fall? 0  0    ?Risk for fall due to :   No Fall Risks    ?Follow up   Falls evaluation completed    ? ? ?FALL RISK PREVENTION PERTAINING TO THE HOME: ? ?Any stairs in or around the home? No  ?If so, are there any without handrails? No  ?Home free of loose throw rugs in walkways, pet beds, electrical cords, etc? Yes  ?Adequate lighting in your home to reduce risk of falls? Yes  ? ?ASSISTIVE DEVICES UTILIZED TO PREVENT FALLS: ? ?Life alert? Yes  ?Use of a cane, walker  or w/c? Yes  ?Grab bars in the bathroom? Yes  ?Shower chair or bench in shower? Yes  ?Elevated toilet seat or a handicapped toilet? Yes  ? ?TIMED UP AND GO: ? ?Was the test performed? Yes .  ?Length of ti

## 2021-05-27 NOTE — Progress Notes (Signed)
Patient ID: Jenny OregonRebecca Acosta, female   DOB: 1945/12/14, 76 y.o.   MRN: [id number] ? ? ? ?    Chief Complaint: yearly exam ? ?     HPI:  Jenny OregonRebecca Acosta is a 76 y.o. female here overall doing ok, has been more forgetful lately, but denies other worsening memory loss.  BP has been < 140/90a t home.Trying to follow lower chol diet.  Has worsening right hearing loss in the past week without HA, fever, chills, tinnitus or vertigo.  Pt denies chest pain, increased sob or doe, wheezing, orthopnea, PND, increased LE swelling, palpitations, dizziness or syncope.   Pt denies polydipsia, polyuria, or new focal neuro s/s.   Also has ongoing poor dentition and incidentally with left upper gum red, swelling pain and soreness without drainage or fever.   ?    ?Wt Readings from Last 3 Encounters:  ?05/27/21 246 lb (111.6 kg)  ?05/27/21 246 lb (111.6 kg)  ?02/27/20 251 lb (113.9 kg)  ? ?BP Readings from Last 3 Encounters:  ?05/27/21 (!) 142/78  ?05/27/21 (!) 142/78  ?02/27/20 120/82  ? ?      ?Past Medical History:  ?Diagnosis Date  ? Allergic rhinitis, cause unspecified   ? Arthritis   ? Carotid stenosis   ? Family history of colon cancer 07/21/2013  ? mother  ? Gallstone   ? GERD (gastroesophageal reflux disease)   ? Hyperlipidemia 08/30/2010  ? Hypertension   ? Hypothyroidism   ? Impaired glucose tolerance   ? Pain, coccyx   ? Right lumbar radiculopathy 08/30/2010  ? ?Past Surgical History:  ?Procedure Laterality Date  ? APPENDECTOMY  1953  ? CHOLECYSTECTOMY  2002  ? CHOLECYSTECTOMY OPEN  10 years ago  ? scarlet fever    ? age 76  ? TONSILLECTOMY  1959  ? ? reports that she has never smoked. She has never used smokeless tobacco. She reports that she does not drink alcohol and does not use drugs. ?family history includes Arthritis in her sister; Atrial fibrillation in her sister; Colon cancer in her mother; Diabetes in her daughter and maternal grandmother; Stroke in her mother. ?Allergies  ?Allergen Reactions  ? Lipitor [Atorvastatin]   ?   myalgia  ? Methocarbamol Other (See Comments)  ?  hallucinations  ? Simvastatin   ?  Hair loss  ? Sulfa Antibiotics   ?  unknown  ? ?Current Outpatient Medications on File Prior to Visit  ?Medication Sig Dispense Refill  ? acetaminophen (TYLENOL) 500 MG tablet Take 500 mg by mouth every 6 (six) hours as needed. For pain    ? Cholecalciferol (THERA-D 2000) 50 MCG (2000 UT) TABS 1 tab by mouth epr day 30 tablet 99  ? DHA-EPA-Vitamin E (OMEGA-3 COMPLEX PO) Take by mouth.    ? guaiFENesin (MUCUS RELIEF ADULT PO) Take by mouth.    ? levothyroxine (SYNTHROID) 88 MCG tablet TAKE 1 TABLET BY MOUTH EVERY DAY 90 tablet 1  ? multivitamin-lutein (OCUVITE-LUTEIN) CAPS capsule Take 1 capsule by mouth daily.    ? nortriptyline (PAMELOR) 10 MG capsule 1-3 TABS BY MOUTH AT BEDTIME AS NEEDED FOR PAIN AND SLEEP 270 capsule 1  ? ondansetron (ZOFRAN) 4 MG tablet TAKE 1 TABLET BY MOUTH EVERY 8 HOURS AS NEEDED FOR NAUSEA AND VOMITING 30 tablet 0  ? pantoprazole (PROTONIX) 40 MG tablet TAKE 1 TABLET BY MOUTH EVERY DAY 90 tablet 1  ? PARoxetine (PAXIL) 30 MG tablet TAKE 1 TABLET BY MOUTH EVERY DAY 90 tablet 1  ?  tiZANidine (ZANAFLEX) 4 MG tablet TAKE 1 TABLET BY MOUTH THREE TIMES A DAY AS NEEDED 270 tablet 3  ? traMADol (ULTRAM) 50 MG tablet Take 1 tablet (50 mg total) by mouth every 6 (six) hours as needed. 120 tablet 2  ? Triprolidine-Pseudoephedrine (ANTIHISTAMINE PO) Take by mouth.    ? pravastatin (PRAVACHOL) 80 MG tablet Take 1 tablet (80 mg total) by mouth daily. (Patient not taking: Reported on 05/27/2021) 90 tablet 3  ? triamcinolone (NASACORT) 55 MCG/ACT AERO nasal inhaler Place 2 sprays into the nose daily. (Patient not taking: Reported on 05/27/2021) 1 Inhaler 12  ? ?No current facility-administered medications on file prior to visit.  ? ?     ROS:  All others reviewed and negative. ? ?Objective  ? ?     PE:  BP (!) 142/78 (BP Location: Left Arm, Patient Position: Sitting, Cuff Size: Large)   Pulse (!) 102   Temp 98.1 ?F (36.7 ?C)  (Oral)   Ht 5\' 4"  (1.626 m)   Wt 246 lb (111.6 kg)   SpO2 94%   BMI 42.23 kg/m?  ? ?              Constitutional: Pt appears in NAD ?              HENT: Head: NCAT.  ?              Right Ear: External ear normal.   ?              Left Ear: External ear normal.  ?              Eyes: . Pupils are equal, round, and reactive to light. Conjunctivae and EOM are normal ?              Nose: without d/c or deformity; mouth with left upper gum red sweling without drainage ?              Neck: Neck supple. Gross normal ROM ?              Cardiovascular: Normal rate and regular rhythm.   ?              Pulmonary/Chest: Effort normal and breath sounds without rales or wheezing.  ?              Abd:  Soft, NT, ND, + BS, no organomegaly ?              Neurological: Pt is alert. At baseline orientation, motor grossly intact ?              Skin: Skin is warm. No rashes, no other new lesions, LE edema - none ?              Psychiatric: Pt behavior is normal without agitation  ? ?Micro: none ? ?Cardiac tracings I have personally interpreted today:  none ? ?Pertinent Radiological findings (summarize): none  ? ?Lab Results  ?Component Value Date  ? WBC 10.9 (H) 01/11/2019  ? HGB 15.1 (H) 01/11/2019  ? HCT 46.4 (H) 01/11/2019  ? PLT 296.0 01/11/2019  ? GLUCOSE 111 (H) 01/11/2019  ? CHOL 292 (H) 01/11/2019  ? TRIG 247.0 (H) 01/11/2019  ? HDL 60.70 01/11/2019  ? LDLDIRECT 187.0 01/11/2019  ? LDLCALC 193 (H) 07/21/2013  ? ALT 16 01/11/2019  ? AST 16 01/11/2019  ? NA 137 01/11/2019  ? K 4.4 01/11/2019  ? CL 101 01/11/2019  ?  CREATININE 0.70 01/11/2019  ? BUN 15 01/11/2019  ? CO2 27 01/11/2019  ? TSH 4.65 (H) 01/11/2019  ? HGBA1C 5.8 01/11/2019  ? ?Assessment/Plan:  ?Jenny Acosta is a 76 y.o. White or Caucasian [1] female with  has a past medical history of Allergic rhinitis, cause unspecified, Arthritis, Carotid stenosis, Family history of colon cancer (07/21/2013), Gallstone, GERD (gastroesophageal reflux disease), Hyperlipidemia  (08/30/2010), Hypertension, Hypothyroidism, Impaired glucose tolerance, Pain, coccyx, and Right lumbar radiculopathy (08/30/2010). ? ?Hyperlipidemia ?Lab Results  ?Component Value Date  ? LDLCALC 193 (H) 07/21/2013  ? ?Uncontrolled, for f/u lab today, goal ldl < 100, continue pravachol 80 for now ? ? ?Hypertension ?BP Readings from Last 3 Encounters:  ?05/27/21 (!) 142/78  ?05/27/21 (!) 142/78  ?02/27/20 120/82  ? ?Mild uncontrolled, declines change in tx for now, states BP athome < 140/90, pt to continue low salt diet, wt control ? ? ?Hypothyroidism ?Lab Results  ?Component Value Date  ? TSH 4.65 (H) 01/11/2019  ? ?uncontrolled, pt to continue levothyroxine and f/u lab ? ? ? ?Impaired glucose tolerance ?Lab Results  ?Component Value Date  ? HGBA1C 5.8 01/11/2019  ? ?Stable, pt to continue current medical treatment  - diet ? ?Vitamin D deficiency ?Last vitamin D ?Lab Results  ?Component Value Date  ? VD25OH 22.56 (L) 01/11/2019  ? ?Low, to start oral replacement ? ? ?Acute hearing loss, right ?Improved s/p irrigation, Ceruminosis is noted.  Wax is removed by syringing and manual debridement. Instructions for home care to prevent wax buildup are given. ? ? ?Gingivitis ?Mild to mod, for antibx course,  to f/u any worsening symptoms or concerns ? ?Hypersomnolence ?Mild, for pulm referral r/o osa ? ?Followup: Return in about 6 months (around 11/26/2021). ? ?Oliver Barre, MD 05/27/2021 9:23 PM ?[facility] ?[facility] ?Internal Medicine ?

## 2021-05-27 NOTE — Assessment & Plan Note (Signed)
Mild, for pulm referral r/o osa ?

## 2021-05-28 LAB — BASIC METABOLIC PANEL
BUN: 15 mg/dL (ref 6–23)
CO2: 28 mEq/L (ref 19–32)
Calcium: 9.8 mg/dL (ref 8.4–10.5)
Chloride: 98 mEq/L (ref 96–112)
Creatinine, Ser: 0.78 mg/dL (ref 0.40–1.20)
GFR: 73.9 mL/min (ref >60.00)
Glucose, Bld: 101 mg/dL — ABNORMAL HIGH (ref 70–99)
Potassium: 4.5 mEq/L (ref 3.5–5.1)
Sodium: 138 mEq/L (ref 135–145)

## 2021-05-28 LAB — HEPATIC FUNCTION PANEL
ALT: 32 U/L (ref 0–35)
AST: 35 U/L (ref 0–37)
Albumin: 4.5 g/dL (ref 3.5–5.2)
Alkaline Phosphatase: 129 U/L — ABNORMAL HIGH (ref 39–117)
Bilirubin, Direct: 0.1 mg/dL (ref 0.0–0.3)
Total Bilirubin: 0.4 mg/dL (ref 0.2–1.2)
Total Protein: 8.1 g/dL (ref 6.0–8.3)

## 2021-05-28 LAB — LIPID PANEL
Cholesterol: 297 mg/dL — ABNORMAL HIGH (ref 0–200)
HDL: 52.6 mg/dL (ref >39.00)
Total CHOL/HDL Ratio: 6
Triglycerides: 445 mg/dL — ABNORMAL HIGH (ref 0.0–149.0)

## 2021-05-28 LAB — VITAMIN D 25 HYDROXY (VIT D DEFICIENCY, FRACTURES): VITD: 39.94 ng/mL (ref 30.00–100.00)

## 2021-05-28 LAB — HEMOGLOBIN A1C: Hgb A1c MFr Bld: 6 % (ref 4.6–6.5)

## 2021-05-28 LAB — LDL CHOLESTEROL, DIRECT: Direct LDL: 166 mg/dL

## 2021-08-06 ENCOUNTER — Other Ambulatory Visit: Payer: Self-pay | Admitting: Internal Medicine

## 2021-09-02 ENCOUNTER — Encounter: Payer: Self-pay | Admitting: Internal Medicine

## 2021-09-12 ENCOUNTER — Ambulatory Visit: Payer: Medicare Other | Admitting: Internal Medicine

## 2021-09-23 ENCOUNTER — Telehealth: Payer: Medicare Other | Admitting: Internal Medicine

## 2021-10-16 ENCOUNTER — Other Ambulatory Visit: Payer: Self-pay | Admitting: Internal Medicine

## 2021-10-17 ENCOUNTER — Other Ambulatory Visit: Payer: Self-pay | Admitting: Internal Medicine

## 2021-11-01 ENCOUNTER — Other Ambulatory Visit: Payer: Self-pay | Admitting: Internal Medicine

## 2021-11-05 ENCOUNTER — Telehealth: Payer: Self-pay | Admitting: Internal Medicine

## 2021-11-05 NOTE — Telephone Encounter (Signed)
Patient is on Pamelor - 1 to 3 tablets at bedtime - She wants to know if she can add 2 tablets during the day.  She said it really helps her nerve pain but she feels like she needs something other than just at bedtrime.

## 2021-11-06 MED ORDER — NORTRIPTYLINE HCL 10 MG PO CAPS: ORAL_CAPSULE | ORAL | 1 refills | Status: DC

## 2021-11-06 NOTE — Telephone Encounter (Signed)
Ok this is done as per pt request

## 2021-11-06 NOTE — Telephone Encounter (Signed)
Please advise 

## 2021-11-21 ENCOUNTER — Other Ambulatory Visit: Payer: Self-pay | Admitting: Internal Medicine

## 2021-11-25 ENCOUNTER — Ambulatory Visit: Payer: Medicare Other | Admitting: Internal Medicine

## 2021-11-26 ENCOUNTER — Ambulatory Visit: Payer: Medicare Other | Admitting: Internal Medicine

## 2021-12-04 ENCOUNTER — Ambulatory Visit: Payer: Medicare Other | Admitting: Internal Medicine

## 2021-12-25 ENCOUNTER — Other Ambulatory Visit: Payer: Self-pay | Admitting: Internal Medicine

## 2021-12-26 ENCOUNTER — Telehealth: Payer: Self-pay | Admitting: Internal Medicine

## 2021-12-26 NOTE — Telephone Encounter (Signed)
Caller & Relationship to patient: Jenny Acosta - self  Call back number: [phone]  Date of last office visit: 05-27-21  Date of next office visit: 01-01-22  Medication(s) to be refilled:  nortriptyline (PAMELOR) 10 MG capsule   Patient states that her frequency was changed at her last visit but the RX was not updated and she has run out of medication.  Preferred Pharmacy: [id number] - [place], [place]

## 2021-12-27 NOTE — Telephone Encounter (Signed)
Recent prescription was for quantity #450 pills  I really doubt she has used 450 pill in the past 5 weeks.    Please make sure pt is aware of the sept 2023 rx as this should be too early for refill

## 2022-01-01 ENCOUNTER — Ambulatory Visit: Payer: Medicare Other | Admitting: Internal Medicine

## 2022-01-01 NOTE — Telephone Encounter (Signed)
Left message for a call back regarding this medication.

## 2022-01-02 NOTE — Telephone Encounter (Signed)
Patient called back - she said that she did get the rx for 450 and that she is not out - she thinks the request was an older one.

## 2022-01-13 ENCOUNTER — Ambulatory Visit: Payer: Medicare Other | Admitting: Internal Medicine

## 2022-01-21 ENCOUNTER — Ambulatory Visit: Payer: Medicare Other | Admitting: Internal Medicine

## 2022-01-23 ENCOUNTER — Telehealth: Payer: Medicare Other | Admitting: Internal Medicine

## 2022-01-23 ENCOUNTER — Telehealth: Payer: Self-pay | Admitting: Internal Medicine

## 2022-01-23 DIAGNOSIS — E559 Vitamin D deficiency, unspecified: Secondary | ICD-10-CM

## 2022-01-23 NOTE — Telephone Encounter (Signed)
Sorry, I think she means an Echo or similar test to check for any worsening ascending aortic aneurysm  Due to the nature of the testing, it would be best to have an OV in person to discuss and help make sure the insurance will cover  I am sorry we missed her 320 visit today, but I would not normally order this testing without an OV, thanks

## 2022-01-23 NOTE — Progress Notes (Signed)
Patient ID: Jenny Acosta, female   DOB: 1945-08-25, 76 y.o.   MRN: [id number]  Pt not seen

## 2022-01-23 NOTE — Telephone Encounter (Signed)
Patient would like an ECG scheduled to check on her anuersym - Patient also would like blood work too.  Please let patient know - she will chat with you after the results of the tests come in.

## 2022-02-07 ENCOUNTER — Other Ambulatory Visit: Payer: Self-pay | Admitting: Internal Medicine

## 2022-02-23 ENCOUNTER — Other Ambulatory Visit: Payer: Self-pay | Admitting: Internal Medicine

## 2022-03-17 ENCOUNTER — Other Ambulatory Visit: Payer: Self-pay | Admitting: Internal Medicine

## 2022-04-02 ENCOUNTER — Other Ambulatory Visit: Payer: Self-pay | Admitting: Internal Medicine

## 2022-05-01 ENCOUNTER — Ambulatory Visit: Payer: Medicare Other | Admitting: Internal Medicine

## 2022-05-06 ENCOUNTER — Other Ambulatory Visit: Payer: Self-pay | Admitting: Internal Medicine

## 2022-05-08 ENCOUNTER — Ambulatory Visit: Payer: Medicare Other | Admitting: Internal Medicine

## 2022-05-16 ENCOUNTER — Ambulatory Visit: Payer: Medicare Other | Admitting: Internal Medicine

## 2022-05-21 ENCOUNTER — Telehealth: Payer: Self-pay

## 2022-05-21 NOTE — Telephone Encounter (Signed)
Called patient to schedule Medicare Annual Wellness Visit (AWV). No voicemail available to leave a message.  Last date of AWV: 05/27/21  Please schedule an appointment at any time with [facility].    Norton Blizzard, CMA (AAMA)  [facility]

## 2022-06-16 ENCOUNTER — Other Ambulatory Visit: Payer: Self-pay | Admitting: Internal Medicine

## 2022-06-17 ENCOUNTER — Ambulatory Visit (INDEPENDENT_AMBULATORY_CARE_PROVIDER_SITE_OTHER): Payer: 59 | Admitting: Internal Medicine

## 2022-06-17 ENCOUNTER — Encounter: Payer: Self-pay | Admitting: Internal Medicine

## 2022-06-17 VITALS — BP 130/82 | HR 94 | Temp 98.5°F | Ht 64.0 in | Wt 244.0 lb

## 2022-06-17 DIAGNOSIS — E559 Vitamin D deficiency, unspecified: Secondary | ICD-10-CM | POA: Diagnosis not present

## 2022-06-17 DIAGNOSIS — G8929 Other chronic pain: Secondary | ICD-10-CM | POA: Diagnosis not present

## 2022-06-17 DIAGNOSIS — E039 Hypothyroidism, unspecified: Secondary | ICD-10-CM

## 2022-06-17 DIAGNOSIS — H9193 Unspecified hearing loss, bilateral: Secondary | ICD-10-CM | POA: Diagnosis not present

## 2022-06-17 DIAGNOSIS — E538 Deficiency of other specified B group vitamins: Secondary | ICD-10-CM

## 2022-06-17 DIAGNOSIS — M25561 Pain in right knee: Secondary | ICD-10-CM

## 2022-06-17 DIAGNOSIS — R490 Dysphonia: Secondary | ICD-10-CM | POA: Diagnosis not present

## 2022-06-17 DIAGNOSIS — I77810 Thoracic aortic ectasia: Secondary | ICD-10-CM | POA: Diagnosis not present

## 2022-06-17 DIAGNOSIS — I10 Essential (primary) hypertension: Secondary | ICD-10-CM | POA: Diagnosis not present

## 2022-06-17 DIAGNOSIS — M25562 Pain in left knee: Secondary | ICD-10-CM | POA: Diagnosis not present

## 2022-06-17 DIAGNOSIS — R7302 Impaired glucose tolerance (oral): Secondary | ICD-10-CM | POA: Diagnosis not present

## 2022-06-17 DIAGNOSIS — Z0001 Encounter for general adult medical examination with abnormal findings: Secondary | ICD-10-CM | POA: Diagnosis not present

## 2022-06-17 DIAGNOSIS — E7849 Other hyperlipidemia: Secondary | ICD-10-CM

## 2022-06-17 LAB — CBC WITH DIFFERENTIAL/PLATELET
Basophils Absolute: 0 10*3/uL (ref 0.0–0.1)
Basophils Relative: 0.4 % (ref 0.0–3.0)
Eosinophils Absolute: 0.1 10*3/uL (ref 0.0–0.7)
Eosinophils Relative: 1.1 % (ref 0.0–5.0)
HCT: 43.3 % (ref 36.0–46.0)
Hemoglobin: 14.4 g/dL (ref 12.0–15.0)
Lymphocytes Relative: 43.3 % (ref 12.0–46.0)
Lymphs Abs: 4.2 10*3/uL — ABNORMAL HIGH (ref 0.7–4.0)
MCHC: 33.2 g/dL (ref 30.0–36.0)
MCV: 86.4 fl (ref 78.0–100.0)
Monocytes Absolute: 0.7 10*3/uL (ref 0.1–1.0)
Monocytes Relative: 7.5 % (ref 3.0–12.0)
Neutro Abs: 4.6 10*3/uL (ref 1.4–7.7)
Neutrophils Relative %: 47.7 % (ref 43.0–77.0)
Platelets: 255 10*3/uL (ref 150.0–400.0)
RBC: 5.02 Mil/uL (ref 3.87–5.11)
RDW: 15 % (ref 11.5–15.5)
WBC: 9.7 10*3/uL (ref 4.0–10.5)

## 2022-06-17 LAB — HEPATIC FUNCTION PANEL
ALT: 23 U/L (ref 0–35)
AST: 19 U/L (ref 0–37)
Albumin: 4.2 g/dL (ref 3.5–5.2)
Alkaline Phosphatase: 116 U/L (ref 39–117)
Bilirubin, Direct: 0.1 mg/dL (ref 0.0–0.3)
Total Bilirubin: 0.2 mg/dL (ref 0.2–1.2)
Total Protein: 7.7 g/dL (ref 6.0–8.3)

## 2022-06-17 LAB — BASIC METABOLIC PANEL
BUN: 15 mg/dL (ref 6–23)
CO2: 31 mEq/L (ref 19–32)
Calcium: 9.8 mg/dL (ref 8.4–10.5)
Chloride: 101 mEq/L (ref 96–112)
Creatinine, Ser: 0.72 mg/dL (ref 0.40–1.20)
GFR: 80.75 mL/min (ref >60.00)
Glucose, Bld: 90 mg/dL (ref 70–99)
Potassium: 4.6 mEq/L (ref 3.5–5.1)
Sodium: 138 mEq/L (ref 135–145)

## 2022-06-17 LAB — HEMOGLOBIN A1C: Hgb A1c MFr Bld: 5.8 % (ref 4.6–6.5)

## 2022-06-17 LAB — LIPID PANEL
Cholesterol: 231 mg/dL — ABNORMAL HIGH (ref 0–200)
HDL: 46.4 mg/dL (ref >39.00)
Total CHOL/HDL Ratio: 5
Triglycerides: 460 mg/dL — ABNORMAL HIGH (ref 0.0–149.0)

## 2022-06-17 LAB — TSH: TSH: 7.4 u[IU]/mL — ABNORMAL HIGH (ref 0.35–5.50)

## 2022-06-17 LAB — VITAMIN B12: Vitamin B-12: 666 pg/mL (ref 211–911)

## 2022-06-17 MED ORDER — TRAMADOL HCL 50 MG PO TABS: 50.0000 mg | ORAL_TABLET | Freq: Four times a day (QID) | ORAL | 5 refills | Status: DC | PRN

## 2022-06-17 NOTE — Patient Instructions (Signed)
Please continue all other medications as before, and refills have been done for the tramadol  Please have the pharmacy call with any other refills you may need.  Please continue your efforts at being more active, low cholesterol diet, and weight control.  You are otherwise up to date with prevention measures today.  Please keep your appointments with your specialists as you may have planned  You will be contacted regarding the referral for: ENT, Sports Medicine, Pain management, and Cardiothoracic sugury  Please go to the LAB at the blood drawing area for the tests to be done  You will be contacted by phone if any changes need to be made immediately.  Otherwise, you will receive a letter about your results with an explanation, but please check with MyChart first.  Please remember to sign up for MyChart if you have not done so, as this will be important to you in the future with finding out test results, communicating by private email, and scheduling acute appointments online when needed.  Please make an Appointment to return in 6 months, or sooner if needed

## 2022-06-17 NOTE — Progress Notes (Unsigned)
Patient ID: Jenny Acosta, female   DOB: Dec 21, 1945, 77 y.o.   MRN: [id number]         Chief Complaint:: wellness exam and Medical Management of Chronic Issues (Has been having unbearable pain , referral for echo cardiogram , needs new prescription for tramadol, referral to pain clinic , referral for dermatology and physical therapy )  , hoarseness, bilateral hearing loss, bilateral knee djd, chronic lbp, ascending aortic aneurysm,       HPI:  Jenny Acosta is a 77 y.o. female here for wellness exam with daughter, declines dxa for now, declines covid booster,, for shingrix at pharmacy, o/w up to date.                          Also Pt denies chest pain, increased sob or doe, wheezing, orthopnea, PND, increased LE swelling, palpitations, dizziness or syncope.   Pt denies polydipsia, polyuria, or new focal neuro s/s.   Does have mild worsening hoarseness and bilat knee djd pain, has not seen ortho, Pt continues to have recurring LBP without change in severity, bowel or bladder change, fever, wt loss,  worsening LE pain/numbness/weakness, gait change or falls.  Asking for pain management.  Also has known aortic ascending aneurysm due for follow up exam.     Wt Readings from Last 3 Encounters:  06/17/22 244 lb (110.7 kg)  05/27/21 246 lb (111.6 kg)  05/27/21 246 lb (111.6 kg)   BP Readings from Last 3 Encounters:  06/17/22 130/82  05/27/21 (!) 142/78  05/27/21 (!) 142/78   Immunization History  Administered Date(s) Administered   DTaP 08/24/2001   Influenza Split 02/27/2011   Influenza, High Dose Seasonal PF 01/06/2017, 10/27/2017   Influenza,inj,Quad PF,6+ Mos 12/15/2013   Moderna Sars-Covid-2 Vaccination 11/11/2019, 12/09/2019   Pneumococcal Conjugate-13 07/21/2013   Pneumococcal Polysaccharide-23 08/30/2010   Tdap 01/06/2017   Zoster, Live 08/30/2007   Health Maintenance Due  Topic Date Due   DEXA SCAN  Never done   Medicare Annual Wellness (AWV)  05/28/2022      Past Medical  History:  Diagnosis Date   Allergic rhinitis, cause unspecified    Arthritis    Carotid stenosis    Family history of colon cancer 07/21/2013   mother   Gallstone    GERD (gastroesophageal reflux disease)    Hyperlipidemia 08/30/2010   Hypertension    Hypothyroidism    Impaired glucose tolerance    Pain, coccyx    Right lumbar radiculopathy 08/30/2010   Past Surgical History:  Procedure Laterality Date   APPENDECTOMY  1953   CHOLECYSTECTOMY  2002   CHOLECYSTECTOMY OPEN  10 years ago   scarlet fever     age 75   TONSILLECTOMY  1959    reports that she has never smoked. She has never used smokeless tobacco. She reports that she does not drink alcohol and does not use drugs. family history includes Arthritis in her sister; Atrial fibrillation in her sister; Colon cancer in her mother; Diabetes in her daughter and maternal grandmother; Stroke in her mother. Allergies  Allergen Reactions   Lipitor [Atorvastatin]     myalgia   Methocarbamol Other (See Comments)    hallucinations   Simvastatin     Hair loss   Sulfa Antibiotics     unknown   Current Outpatient Medications on File Prior to Visit  Medication Sig Dispense Refill   acetaminophen (TYLENOL) 500 MG tablet Take 500 mg by mouth every 6 (  six) hours as needed. For pain     Cholecalciferol (THERA-D 2000) 50 MCG (2000 UT) TABS 1 tab by mouth epr day 30 tablet 99   DHA-EPA-Vitamin E (OMEGA-3 COMPLEX PO) Take by mouth.     guaiFENesin (MUCUS RELIEF ADULT PO) Take by mouth.     multivitamin-lutein (OCUVITE-LUTEIN) CAPS capsule Take 1 capsule by mouth daily.     nortriptyline (PAMELOR) 10 MG capsule 2 tab by mouth once in the AM, then 1-3 TABS BY MOUTH AT BEDTIME AS NEEDED FOR PAIN AND SLEEP 450 capsule 1   ondansetron (ZOFRAN) 4 MG tablet TAKE 1 TABLET BY MOUTH EVERY 8 HOURS AS NEEDED FOR NAUSEA AND VOMITING 30 tablet 0   pantoprazole (PROTONIX) 40 MG tablet TAKE 1 TABLET BY MOUTH EVERY DAY 90 tablet 2   PARoxetine (PAXIL) 30 MG  tablet TAKE 1 TABLET BY MOUTH EVERY DAY 90 tablet 2   tiZANidine (ZANAFLEX) 4 MG tablet TAKE 1 TABLET BY MOUTH THREE TIMES A DAY AS NEEDED 270 tablet 3   triamcinolone (NASACORT) 55 MCG/ACT AERO nasal inhaler Place 2 sprays into the nose daily. 1 Inhaler 12   Triprolidine-Pseudoephedrine (ANTIHISTAMINE PO) Take by mouth.     No current facility-administered medications on file prior to visit.        ROS:  All others reviewed and negative.  Objective        PE:  BP 130/82 (BP Location: Right Arm, Patient Position: Sitting, Cuff Size: Normal)   Pulse 94   Temp 98.5 F (36.9 C) (Oral)   Ht  (1.626 m)   Wt 244 lb (110.7 kg)   SpO2 98%   BMI 41.88 kg/m                 Constitutional: Pt appears in NAD               HENT: Head: NCAT.                Right Ear: External ear normal.                 Left Ear: External ear normal.                Eyes: . Pupils are equal, round, and reactive to light. Conjunctivae and EOM are normal               Nose: without d/c or deformity               Neck: Neck supple. Gross normal ROM               Cardiovascular: Normal rate and regular rhythm.                 Pulmonary/Chest: Effort normal and breath sounds without rales or wheezing.                Abd:  Soft, NT, ND, + BS, no organomegaly               Neurological: Pt is alert. At baseline orientation, motor grossly intact               Skin: Skin is warm. No rashes, no other new lesions, LE edema - none               Psychiatric: Pt behavior is normal without agitation   Micro: none  Cardiac tracings I have personally interpreted today:  none  Pertinent Radiological findings (summarize): none  Lab Results  Component Value Date   WBC 9.7 06/17/2022   HGB 14.4 06/17/2022   HCT 43.3 06/17/2022   PLT 255.0 06/17/2022   GLUCOSE 90 06/17/2022   CHOL 231 (H) 06/17/2022   TRIG (H) 06/17/2022    460.0 Triglyceride is over 400; calculations on Lipids are invalid.   HDL 46.40 06/17/2022    LDLDIRECT 121.0 06/17/2022   LDLCALC 193 (H) 07/21/2013   ALT 23 06/17/2022   AST 19 06/17/2022   NA 138 06/17/2022   K 4.6 06/17/2022   CL 101 06/17/2022   CREATININE 0.72 06/17/2022   BUN 15 06/17/2022   CO2 31 06/17/2022   TSH 7.40 (H) 06/17/2022   HGBA1C 5.8 06/17/2022   Assessment/Plan:  Jenny Acosta is a 77 y.o. White or Caucasian [1] female with  has a past medical history of Allergic rhinitis, cause unspecified, Arthritis, Carotid stenosis, Family history of colon cancer (07/21/2013), Gallstone, GERD (gastroesophageal reflux disease), Hyperlipidemia (08/30/2010), Hypertension, Hypothyroidism, Impaired glucose tolerance, Pain, coccyx, and Right lumbar radiculopathy (08/30/2010).  Chronic pain Lower back and knees, pt also requests pain management as tramadol not effective enough at some times  Encounter for well adult exam with abnormal findings Age and sex appropriate education and counseling updated with regular exercise and diet Referrals for preventative services - declines dxa for now Immunizations addressed - declines covid booster, for shingrix at pharmacy Smoking counseling  - none needed Evidence for depression or other mood disorder - none significant Most recent labs reviewed. I have personally reviewed and have noted: 1) the patient's medical and social history 2) The patient's current medications and supplements 3) The patient's height, weight, and BMI have been recorded in the chart   Ascending aorta dilatation (HCC) Last exam;   51 mm by MR angio chest w/o CM - dec 2020   Now for cardiothoracic surgury referral, also CTA aorta  Bilateral hearing loss No wax impactions seen today, for ENT referral  Hyperlipidemia Lab Results  Component Value Date   LDLCALC 193 (H) 07/21/2013   Severe uncontrolled, goal ldl < 70,  pt to cont crestor 40 qd, f/u lipid panel   Hypertension BP Readings from Last 3 Encounters:  06/17/22 130/82  05/27/21 (!) 142/78   05/27/21 (!) 142/78   Stable, pt to continue medical treatment  - diet, wt control   Hypothyroidism Lab Results  Component Value Date   TSH 7.40 (H) 06/17/2022   Mild uncontrolled, for increase levothryoxine to 100 mcg qd   Impaired glucose tolerance Lab Results  Component Value Date   HGBA1C 5.8 06/17/2022   Stable, pt to continue current medical treatment  - diet, wt control   Vitamin D deficiency Last vitamin D Lab Results  Component Value Date   VD25OH 33.32 06/17/2022   Low, to start oral replacement   Bilateral knee pain Ok for tramadol prn, refer sport medicine for evaluation  Hoarseness Also for ENT referral DR bates  Followup: Return in about 6 months (around 12/17/2022).  Oliver Barre, MD 06/19/2022 6:01 PM [facility] Internal Medicine

## 2022-06-17 NOTE — Assessment & Plan Note (Signed)
Lower back and knees, pt also requests pain management as tramadol not effective enough

## 2022-06-18 ENCOUNTER — Other Ambulatory Visit: Payer: Self-pay | Admitting: Internal Medicine

## 2022-06-18 LAB — VITAMIN D 25 HYDROXY (VIT D DEFICIENCY, FRACTURES): VITD: 33.32 ng/mL (ref 30.00–100.00)

## 2022-06-18 LAB — LDL CHOLESTEROL, DIRECT: Direct LDL: 121 mg/dL

## 2022-06-18 MED ORDER — LEVOTHYROXINE SODIUM 100 MCG PO TABS: 100.0000 ug | ORAL_TABLET | Freq: Every day | ORAL | 3 refills | Status: DC

## 2022-06-18 MED ORDER — ROSUVASTATIN CALCIUM 40 MG PO TABS
40.0000 mg | ORAL_TABLET | Freq: Every day | ORAL | 3 refills | Status: DC
Start: 2022-06-18 — End: 2022-08-27

## 2022-06-19 ENCOUNTER — Encounter: Payer: Self-pay | Admitting: Internal Medicine

## 2022-06-19 DIAGNOSIS — R490 Dysphonia: Secondary | ICD-10-CM | POA: Insufficient documentation

## 2022-06-19 NOTE — Assessment & Plan Note (Signed)
Last vitamin D Lab Results  Component Value Date   VD25OH 33.32 06/17/2022   Low, to start oral replacement

## 2022-06-19 NOTE — Assessment & Plan Note (Signed)
Lab Results  Component Value Date   HGBA1C 5.8 06/17/2022   Stable, pt to continue current medical treatment  - diet, wt control

## 2022-06-19 NOTE — Assessment & Plan Note (Addendum)
Last exam;   51 mm by MR angio chest w/o CM - dec 2020   Now for cardiothoracic surgury referral, also CTA aorta

## 2022-06-19 NOTE — Assessment & Plan Note (Signed)
Ok for tramadol prn, refer sport medicine for evaluation

## 2022-06-19 NOTE — Assessment & Plan Note (Signed)
Lab Results  Component Value Date   TSH 7.40 (H) 06/17/2022   Mild uncontrolled, for increase levothryoxine to 100 mcg qd

## 2022-06-19 NOTE — Assessment & Plan Note (Signed)
Also for ENT referral DR bates

## 2022-06-19 NOTE — Assessment & Plan Note (Signed)
Age and sex appropriate education and counseling updated with regular exercise and diet Referrals for preventative services - declines dxa for now Immunizations addressed - declines covid booster, for shingrix at pharmacy Smoking counseling  - none needed Evidence for depression or other mood disorder - none significant Most recent labs reviewed. I have personally reviewed and have noted: 1) the patient's medical and social history 2) The patient's current medications and supplements 3) The patient's height, weight, and BMI have been recorded in the chart

## 2022-06-19 NOTE — Assessment & Plan Note (Signed)
No wax impactions seen today, for ENT referral

## 2022-06-19 NOTE — Assessment & Plan Note (Signed)
BP Readings from Last 3 Encounters:  06/17/22 130/82  05/27/21 (!) 142/78  05/27/21 (!) 142/78   Stable, pt to continue medical treatment  - diet, wt control

## 2022-06-19 NOTE — Assessment & Plan Note (Signed)
Lab Results  Component Value Date   LDLCALC 193 (H) 07/21/2013   Severe uncontrolled, goal ldl < 70,  pt to cont crestor 40 qd, f/u lipid panel

## 2022-06-20 MED ORDER — HYDROCODONE BIT-HOMATROP MBR 5-1.5 MG/5ML PO SOLN: 5.0000 mL | Freq: Four times a day (QID) | ORAL | 0 refills | Status: AC | PRN

## 2022-06-20 MED ORDER — ALBUTEROL SULFATE HFA 108 (90 BASE) MCG/ACT IN AERS: 2.0000 | INHALATION_SPRAY | Freq: Four times a day (QID) | RESPIRATORY_TRACT | 1 refills | Status: DC | PRN

## 2022-06-23 ENCOUNTER — Encounter: Payer: Self-pay | Admitting: Internal Medicine

## 2022-06-23 ENCOUNTER — Other Ambulatory Visit: Payer: Self-pay | Admitting: Internal Medicine

## 2022-07-22 ENCOUNTER — Other Ambulatory Visit: Payer: Self-pay | Admitting: Internal Medicine

## 2022-07-23 ENCOUNTER — Other Ambulatory Visit: Payer: Self-pay

## 2022-07-24 ENCOUNTER — Other Ambulatory Visit: Payer: 59

## 2022-07-30 ENCOUNTER — Encounter: Payer: 59 | Admitting: Surgery

## 2022-08-08 NOTE — Progress Notes (Deleted)
[name] [address] [place] [address] Phone: [phone] Subjective:    I'm seeing this patient by the request  of:  [name], MD  CC: fibromyalgia and knee pain   [id number]  Last seen 2015  Jenny Acosta is a 77 y.o. female coming in with complaint of fibromyalgia and knee pain       Past Medical History:  Diagnosis Date   Allergic rhinitis, cause unspecified    Arthritis    Carotid stenosis    Family history of colon cancer 07/21/2013   mother   Gallstone    GERD (gastroesophageal reflux disease)    Hyperlipidemia 08/30/2010   Hypertension    Hypothyroidism    Impaired glucose tolerance    Pain, coccyx    Right lumbar radiculopathy 08/30/2010   Past Surgical History:  Procedure Laterality Date   APPENDECTOMY  1953   CHOLECYSTECTOMY  2002   CHOLECYSTECTOMY OPEN  10 years ago   scarlet fever     age 41   TONSILLECTOMY  67   Social History   Socioeconomic History   Marital status: Divorced    Spouse name: Not on file   Number of children: Not on file   Years of education: Not on file   Highest education level: Not on file  Occupational History   Not on file  Tobacco Use   Smoking status: Never   Smokeless tobacco: Never  Substance and Sexual Activity   Alcohol use: No   Drug use: No   Sexual activity: Not on file  Other Topics Concern   Not on file  Social History Narrative   Not on file   Social Determinants of Health   Financial Resource Strain: Low Risk  (05/27/2021)   Overall Financial Resource Strain (CARDIA)    Difficulty of Paying Living Expenses: Not hard at all  Food Insecurity: No Food Insecurity (05/27/2021)   Hunger Vital Sign    Worried About Running Out of Food in the Last Year: Never true    Ran Out of Food in the Last Year: Never true  Transportation Needs: No Transportation Needs (05/27/2021)   PRAPARE - Administrator, Civil Service (Medical): No    Lack of  Transportation (Non-Medical): No  Physical Activity: Inactive (05/27/2021)   Exercise Vital Sign    Days of Exercise per Week: 0 days    Minutes of Exercise per Session: 0 min  Stress: No Stress Concern Present (05/27/2021)   [employer] of Occupational Health - Occupational Stress Questionnaire    Feeling of Stress : Not at all  Social Connections: Unknown (05/27/2021)   Social Connection and Isolation Panel [NHANES]    Frequency of Communication with Friends and Family: More than three times a week    Frequency of Social Gatherings with Friends and Family: More than three times a week    Attends Religious Services: Patient declined    Database administrator or Organizations: Patient declined    Attends Banker Meetings: Patient declined    Marital Status: Divorced   Allergies  Allergen Reactions   Lipitor [Atorvastatin]     myalgia   Methocarbamol Other (See Comments)    hallucinations   Simvastatin     Hair loss   Sulfa Antibiotics     unknown   Family History  Problem Relation Age of Onset   Colon cancer Mother    Stroke Mother    Arthritis Sister  Atrial fibrillation Sister        has pacemaker   Diabetes Maternal Grandmother    Diabetes Daughter    Stomach cancer Neg Hx     Current Outpatient Medications (Endocrine & Metabolic):    levothyroxine (SYNTHROID) 100 MCG tablet, Take 1 tablet (100 mcg total) by mouth daily.  Current Outpatient Medications (Cardiovascular):    rosuvastatin (CRESTOR) 40 MG tablet, Take 1 tablet (40 mg total) by mouth daily.  Current Outpatient Medications (Respiratory):    albuterol (VENTOLIN HFA) 108 (90 Base) MCG/ACT inhaler, Inhale 2 puffs into the lungs every 6 (six) hours as needed for wheezing or shortness of breath.   guaiFENesin (MUCUS RELIEF ADULT PO), Take by mouth.   triamcinolone (NASACORT) 55 MCG/ACT AERO nasal inhaler, Place 2 sprays into the nose daily.   Triprolidine-Pseudoephedrine (ANTIHISTAMINE PO),  Take by mouth.  Current Outpatient Medications (Analgesics):    acetaminophen (TYLENOL) 500 MG tablet, Take 500 mg by mouth every 6 (six) hours as needed. For pain   traMADol (ULTRAM) 50 MG tablet, Take 1 tablet (50 mg total) by mouth every 6 (six) hours as needed.   Current Outpatient Medications (Other):    Cholecalciferol (THERA-D 2000) 50 MCG (2000 UT) TABS, 1 tab by mouth epr day   DHA-EPA-Vitamin E (OMEGA-3 COMPLEX PO), Take by mouth.   multivitamin-lutein (OCUVITE-LUTEIN) CAPS capsule, Take 1 capsule by mouth daily.   nortriptyline (PAMELOR) 10 MG capsule, 2 TAB BY MOUTH ONCE IN THE AM, THEN 1-3 TABS BY MOUTH AT BEDTIME AS NEEDED FOR PAIN AND SLEEP   ondansetron (ZOFRAN) 4 MG tablet, TAKE 1 TABLET BY MOUTH EVERY 8 HOURS AS NEEDED FOR NAUSEA AND VOMITING   pantoprazole (PROTONIX) 40 MG tablet, TAKE 1 TABLET BY MOUTH EVERY DAY   PARoxetine (PAXIL) 30 MG tablet, TAKE 1 TABLET BY MOUTH EVERY DAY   tiZANidine (ZANAFLEX) 4 MG tablet, TAKE 1 TABLET BY MOUTH THREE TIMES A DAY AS NEEDED   Reviewed prior external information including notes and imaging from  primary care provider As well as notes that were available from care everywhere and other healthcare systems.  Past medical history, social, surgical and family history all reviewed in electronic medical record.  No pertanent information unless stated regarding to the chief complaint.   Review of Systems:  No headache, visual changes, nausea, vomiting, diarrhea, constipation, dizziness, abdominal pain, skin rash, fevers, chills, night sweats, weight loss, swollen lymph nodes, body aches, joint swelling, chest pain, shortness of breath, mood changes. POSITIVE muscle aches  Objective  There were no vitals taken for this visit.   General: No apparent distress alert and oriented x3 mood and affect normal, dressed appropriately.  HEENT: Pupils equal, extraocular movements intact  Respiratory: Patient's speak in full sentences and does not  appear short of breath  Cardiovascular: No lower extremity edema, non tender, no erythema      Impression and Recommendations:    The above documentation has been reviewed and is accurate and complete Judi Saa, DO

## 2022-08-11 ENCOUNTER — Ambulatory Visit: Payer: 59 | Admitting: Family Medicine

## 2022-08-14 ENCOUNTER — Other Ambulatory Visit: Payer: Self-pay

## 2022-08-14 ENCOUNTER — Other Ambulatory Visit: Payer: Self-pay | Admitting: Internal Medicine

## 2022-08-21 ENCOUNTER — Other Ambulatory Visit: Payer: Self-pay

## 2022-08-21 ENCOUNTER — Emergency Department (HOSPITAL_COMMUNITY): Payer: 59

## 2022-08-21 ENCOUNTER — Inpatient Hospital Stay (HOSPITAL_COMMUNITY)
Admission: EM | Admit: 2022-08-21 | Discharge: 2022-08-27 | DRG: 064 | Disposition: A | Discharge status: Rehabilitation Facility | Payer: 59 | Attending: Internal Medicine | Admitting: Internal Medicine

## 2022-08-21 ENCOUNTER — Encounter (HOSPITAL_COMMUNITY): Payer: Self-pay

## 2022-08-21 DIAGNOSIS — J309 Allergic rhinitis, unspecified: Secondary | ICD-10-CM | POA: Diagnosis present

## 2022-08-21 DIAGNOSIS — E7849 Other hyperlipidemia: Secondary | ICD-10-CM | POA: Diagnosis not present

## 2022-08-21 DIAGNOSIS — R531 Weakness: Secondary | ICD-10-CM

## 2022-08-21 DIAGNOSIS — Z8673 Personal history of transient ischemic attack (TIA), and cerebral infarction without residual deficits: Secondary | ICD-10-CM

## 2022-08-21 DIAGNOSIS — E039 Hypothyroidism, unspecified: Secondary | ICD-10-CM | POA: Diagnosis present

## 2022-08-21 DIAGNOSIS — Z8719 Personal history of other diseases of the digestive system: Secondary | ICD-10-CM | POA: Diagnosis not present

## 2022-08-21 DIAGNOSIS — Z823 Family history of stroke: Secondary | ICD-10-CM

## 2022-08-21 DIAGNOSIS — I6523 Occlusion and stenosis of bilateral carotid arteries: Secondary | ICD-10-CM | POA: Diagnosis not present

## 2022-08-21 DIAGNOSIS — Z882 Allergy status to sulfonamides status: Secondary | ICD-10-CM | POA: Diagnosis not present

## 2022-08-21 DIAGNOSIS — Z888 Allergy status to other drugs, medicaments and biological substances status: Secondary | ICD-10-CM

## 2022-08-21 DIAGNOSIS — W19XXXA Unspecified fall, initial encounter: Secondary | ICD-10-CM | POA: Diagnosis not present

## 2022-08-21 DIAGNOSIS — W1830XA Fall on same level, unspecified, initial encounter: Secondary | ICD-10-CM | POA: Diagnosis present

## 2022-08-21 DIAGNOSIS — E785 Hyperlipidemia, unspecified: Secondary | ICD-10-CM | POA: Diagnosis not present

## 2022-08-21 DIAGNOSIS — R7302 Impaired glucose tolerance (oral): Secondary | ICD-10-CM | POA: Diagnosis not present

## 2022-08-21 DIAGNOSIS — M5416 Radiculopathy, lumbar region: Secondary | ICD-10-CM | POA: Diagnosis not present

## 2022-08-21 DIAGNOSIS — M797 Fibromyalgia: Secondary | ICD-10-CM | POA: Diagnosis present

## 2022-08-21 DIAGNOSIS — I6322 Cerebral infarction due to unspecified occlusion or stenosis of basilar arteries: Principal | ICD-10-CM | POA: Diagnosis present

## 2022-08-21 DIAGNOSIS — I771 Stricture of artery: Secondary | ICD-10-CM | POA: Diagnosis not present

## 2022-08-21 DIAGNOSIS — Z79890 Hormone replacement therapy (postmenopausal): Secondary | ICD-10-CM | POA: Diagnosis not present

## 2022-08-21 DIAGNOSIS — R0902 Hypoxemia: Secondary | ICD-10-CM | POA: Diagnosis not present

## 2022-08-21 DIAGNOSIS — R42 Dizziness and giddiness: Secondary | ICD-10-CM

## 2022-08-21 DIAGNOSIS — Y92009 Unspecified place in unspecified non-institutional (private) residence as the place of occurrence of the external cause: Secondary | ICD-10-CM

## 2022-08-21 DIAGNOSIS — E860 Dehydration: Secondary | ICD-10-CM | POA: Diagnosis not present

## 2022-08-21 DIAGNOSIS — I10 Essential (primary) hypertension: Secondary | ICD-10-CM | POA: Diagnosis present

## 2022-08-21 DIAGNOSIS — Z1152 Encounter for screening for COVID-19: Secondary | ICD-10-CM

## 2022-08-21 DIAGNOSIS — Z743 Need for continuous supervision: Secondary | ICD-10-CM | POA: Diagnosis not present

## 2022-08-21 DIAGNOSIS — G936 Cerebral edema: Secondary | ICD-10-CM | POA: Diagnosis not present

## 2022-08-21 DIAGNOSIS — J301 Allergic rhinitis due to pollen: Secondary | ICD-10-CM

## 2022-08-21 DIAGNOSIS — Z9049 Acquired absence of other specified parts of digestive tract: Secondary | ICD-10-CM | POA: Diagnosis not present

## 2022-08-21 DIAGNOSIS — I69398 Other sequelae of cerebral infarction: Secondary | ICD-10-CM | POA: Diagnosis not present

## 2022-08-21 DIAGNOSIS — Z8261 Family history of arthritis: Secondary | ICD-10-CM | POA: Diagnosis not present

## 2022-08-21 DIAGNOSIS — F32A Depression, unspecified: Secondary | ICD-10-CM | POA: Diagnosis not present

## 2022-08-21 DIAGNOSIS — H5500 Unspecified nystagmus: Secondary | ICD-10-CM | POA: Diagnosis present

## 2022-08-21 DIAGNOSIS — Z7902 Long term (current) use of antithrombotics/antiplatelets: Secondary | ICD-10-CM

## 2022-08-21 DIAGNOSIS — R918 Other nonspecific abnormal finding of lung field: Secondary | ICD-10-CM | POA: Diagnosis not present

## 2022-08-21 DIAGNOSIS — R29701 NIHSS score 1: Secondary | ICD-10-CM | POA: Diagnosis not present

## 2022-08-21 DIAGNOSIS — K7460 Unspecified cirrhosis of liver: Secondary | ICD-10-CM | POA: Diagnosis not present

## 2022-08-21 DIAGNOSIS — H6121 Impacted cerumen, right ear: Secondary | ICD-10-CM | POA: Diagnosis not present

## 2022-08-21 DIAGNOSIS — R410 Disorientation, unspecified: Secondary | ICD-10-CM | POA: Diagnosis not present

## 2022-08-21 DIAGNOSIS — I6329 Cerebral infarction due to unspecified occlusion or stenosis of other precerebral arteries: Secondary | ICD-10-CM | POA: Diagnosis not present

## 2022-08-21 DIAGNOSIS — Z79899 Other long term (current) drug therapy: Secondary | ICD-10-CM

## 2022-08-21 DIAGNOSIS — Z6839 Body mass index (BMI) 39.0-39.9, adult: Secondary | ICD-10-CM | POA: Diagnosis not present

## 2022-08-21 DIAGNOSIS — R404 Transient alteration of awareness: Secondary | ICD-10-CM | POA: Diagnosis not present

## 2022-08-21 DIAGNOSIS — K219 Gastro-esophageal reflux disease without esophagitis: Secondary | ICD-10-CM | POA: Diagnosis not present

## 2022-08-21 DIAGNOSIS — I63531 Cerebral infarction due to unspecified occlusion or stenosis of right posterior cerebral artery: Secondary | ICD-10-CM | POA: Diagnosis not present

## 2022-08-21 DIAGNOSIS — J189 Pneumonia, unspecified organism: Secondary | ICD-10-CM | POA: Diagnosis not present

## 2022-08-21 DIAGNOSIS — S199XXA Unspecified injury of neck, initial encounter: Secondary | ICD-10-CM | POA: Diagnosis not present

## 2022-08-21 DIAGNOSIS — K5901 Slow transit constipation: Secondary | ICD-10-CM | POA: Diagnosis not present

## 2022-08-21 DIAGNOSIS — S0990XA Unspecified injury of head, initial encounter: Secondary | ICD-10-CM | POA: Diagnosis not present

## 2022-08-21 DIAGNOSIS — M1990 Unspecified osteoarthritis, unspecified site: Secondary | ICD-10-CM | POA: Diagnosis not present

## 2022-08-21 DIAGNOSIS — J690 Pneumonitis due to inhalation of food and vomit: Secondary | ICD-10-CM | POA: Diagnosis present

## 2022-08-21 DIAGNOSIS — I651 Occlusion and stenosis of basilar artery: Secondary | ICD-10-CM

## 2022-08-21 DIAGNOSIS — I7121 Aneurysm of the ascending aorta, without rupture: Secondary | ICD-10-CM | POA: Diagnosis not present

## 2022-08-21 DIAGNOSIS — E669 Obesity, unspecified: Secondary | ICD-10-CM | POA: Diagnosis present

## 2022-08-21 DIAGNOSIS — I5030 Unspecified diastolic (congestive) heart failure: Secondary | ICD-10-CM | POA: Diagnosis not present

## 2022-08-21 DIAGNOSIS — M47814 Spondylosis without myelopathy or radiculopathy, thoracic region: Secondary | ICD-10-CM | POA: Diagnosis not present

## 2022-08-21 DIAGNOSIS — I639 Cerebral infarction, unspecified: Secondary | ICD-10-CM | POA: Diagnosis not present

## 2022-08-21 DIAGNOSIS — I6521 Occlusion and stenosis of right carotid artery: Secondary | ICD-10-CM | POA: Diagnosis not present

## 2022-08-21 DIAGNOSIS — R55 Syncope and collapse: Secondary | ICD-10-CM | POA: Diagnosis not present

## 2022-08-21 DIAGNOSIS — J9859 Other diseases of mediastinum, not elsewhere classified: Secondary | ICD-10-CM | POA: Diagnosis not present

## 2022-08-21 DIAGNOSIS — R5381 Other malaise: Secondary | ICD-10-CM | POA: Diagnosis not present

## 2022-08-21 DIAGNOSIS — R29818 Other symptoms and signs involving the nervous system: Secondary | ICD-10-CM | POA: Diagnosis not present

## 2022-08-21 DIAGNOSIS — Z8619 Personal history of other infectious and parasitic diseases: Secondary | ICD-10-CM | POA: Diagnosis not present

## 2022-08-21 DIAGNOSIS — D1809 Hemangioma of other sites: Secondary | ICD-10-CM | POA: Diagnosis not present

## 2022-08-21 LAB — CBC WITH DIFFERENTIAL/PLATELET
Abs Immature Granulocytes: 0.04 10*3/uL (ref 0.00–0.07)
Basophils Absolute: 0 10*3/uL (ref 0.0–0.1)
Basophils Relative: 0 %
Eosinophils Absolute: 0.1 10*3/uL (ref 0.0–0.5)
Eosinophils Relative: 1 %
HCT: 47.6 % — ABNORMAL HIGH (ref 36.0–46.0)
Hemoglobin: 15.1 g/dL — ABNORMAL HIGH (ref 12.0–15.0)
Immature Granulocytes: 0 %
Lymphocytes Relative: 23 %
Lymphs Abs: 2.3 10*3/uL (ref 0.7–4.0)
MCH: 28.3 pg (ref 26.0–34.0)
MCHC: 31.7 g/dL (ref 30.0–36.0)
MCV: 89.1 fL (ref 80.0–100.0)
Monocytes Absolute: 0.7 10*3/uL (ref 0.1–1.0)
Monocytes Relative: 7 %
Neutro Abs: 6.8 10*3/uL (ref 1.7–7.7)
Neutrophils Relative %: 69 %
Platelets: 238 10*3/uL (ref 150–400)
RBC: 5.34 MIL/uL — ABNORMAL HIGH (ref 3.87–5.11)
RDW: 14.5 % (ref 11.5–15.5)
WBC: 9.9 10*3/uL (ref 4.0–10.5)
nRBC: 0 % (ref 0.0–0.2)

## 2022-08-21 LAB — COMPREHENSIVE METABOLIC PANEL
ALT: 27 U/L (ref 0–44)
AST: 28 U/L (ref 15–41)
Albumin: 4.1 g/dL (ref 3.5–5.0)
Alkaline Phosphatase: 118 U/L (ref 38–126)
Anion gap: 13 (ref 5–15)
BUN: 14 mg/dL (ref 8–23)
CO2: 26 mmol/L (ref 22–32)
Calcium: 9.1 mg/dL (ref 8.9–10.3)
Chloride: 101 mmol/L (ref 98–111)
Creatinine, Ser: 0.8 mg/dL (ref 0.44–1.00)
GFR, Estimated: >60 mL/min (ref >60)
Glucose, Bld: 125 mg/dL — ABNORMAL HIGH (ref 70–99)
Potassium: 3.6 mmol/L (ref 3.5–5.1)
Sodium: 140 mmol/L (ref 135–145)
Total Bilirubin: 0.6 mg/dL (ref 0.3–1.2)
Total Protein: 8.2 g/dL — ABNORMAL HIGH (ref 6.5–8.1)

## 2022-08-21 LAB — URINALYSIS, ROUTINE W REFLEX MICROSCOPIC
Bilirubin Urine: NEGATIVE
Glucose, UA: NEGATIVE mg/dL
Hgb urine dipstick: NEGATIVE
Ketones, ur: NEGATIVE mg/dL
Leukocytes,Ua: NEGATIVE
Nitrite: NEGATIVE
Protein, ur: 30 mg/dL — AB
Specific Gravity, Urine: 1.021 (ref 1.005–1.030)
pH: 5 (ref 5.0–8.0)

## 2022-08-21 LAB — AMMONIA: Ammonia: 13 umol/L (ref 9–35)

## 2022-08-21 LAB — RESP PANEL BY RT-PCR (RSV, FLU A&B, COVID)  RVPGX2
Influenza A by PCR: NEGATIVE
Influenza B by PCR: NEGATIVE
Resp Syncytial Virus by PCR: NEGATIVE
SARS Coronavirus 2 by RT PCR: NEGATIVE

## 2022-08-21 LAB — CBG MONITORING, ED: Glucose-Capillary: 118 mg/dL — ABNORMAL HIGH (ref 70–99)

## 2022-08-21 LAB — TROPONIN I (HIGH SENSITIVITY): Troponin I (High Sensitivity): 3 ng/L (ref <18)

## 2022-08-21 LAB — ETHANOL: Alcohol, Ethyl (B): <10 mg/dL (ref <10)

## 2022-08-21 LAB — MAGNESIUM: Magnesium: 2.3 mg/dL (ref 1.7–2.4)

## 2022-08-21 LAB — SALICYLATE LEVEL: Salicylate Lvl: <7 mg/dL — ABNORMAL LOW (ref 7.0–30.0)

## 2022-08-21 LAB — ACETAMINOPHEN LEVEL: Acetaminophen (Tylenol), Serum: <10 ug/mL — ABNORMAL LOW (ref 10–30)

## 2022-08-21 MED ORDER — IOHEXOL 350 MG/ML SOLN
100.0000 mL | Freq: Once | INTRAVENOUS | Status: AC | PRN
  Administered 2022-08-21: 100 mL via INTRAVENOUS

## 2022-08-21 MED ORDER — ONDANSETRON HCL 4 MG/2ML IJ SOLN
4.0000 mg | Freq: Once | INTRAMUSCULAR | Status: AC
  Administered 2022-08-21: 4 mg via INTRAVENOUS
  Filled 2022-08-21: qty 2

## 2022-08-21 MED ORDER — LEVOTHYROXINE SODIUM 100 MCG PO TABS
100.0000 ug | ORAL_TABLET | Freq: Every day | ORAL | Status: DC
  Administered 2022-08-22 – 2022-08-27 (×6): 100 ug via ORAL
  Filled 2022-08-21 (×6): qty 1

## 2022-08-21 MED ORDER — TRIAMCINOLONE ACETONIDE 55 MCG/ACT NA AERO: 2.0000 | INHALATION_SPRAY | Freq: Every day | NASAL | Status: DC

## 2022-08-21 MED ORDER — ACETAMINOPHEN 325 MG PO TABS
650.0000 mg | ORAL_TABLET | Freq: Four times a day (QID) | ORAL | Status: DC | PRN
  Administered 2022-08-21 – 2022-08-26 (×8): 650 mg via ORAL
  Filled 2022-08-21 (×8): qty 2

## 2022-08-21 MED ORDER — SODIUM CHLORIDE 0.9 % IV SOLN
2.0000 g | INTRAVENOUS | Status: DC
  Administered 2022-08-22 – 2022-08-23 (×2): 2 g via INTRAVENOUS
  Filled 2022-08-21 (×2): qty 20

## 2022-08-21 MED ORDER — SODIUM CHLORIDE 0.9 % IV SOLN: 1.0000 g | Freq: Once | INTRAVENOUS | Status: DC

## 2022-08-21 MED ORDER — ONDANSETRON HCL 4 MG/2ML IJ SOLN
4.0000 mg | Freq: Four times a day (QID) | INTRAMUSCULAR | Status: DC | PRN
  Administered 2022-08-22 – 2022-08-27 (×6): 4 mg via INTRAVENOUS
  Filled 2022-08-21 (×6): qty 2

## 2022-08-21 MED ORDER — SODIUM CHLORIDE 0.9 % IV SOLN
500.0000 mg | INTRAVENOUS | Status: DC
  Administered 2022-08-21 – 2022-08-22 (×2): 500 mg via INTRAVENOUS
  Filled 2022-08-21 (×3): qty 5

## 2022-08-21 MED ORDER — MECLIZINE HCL 25 MG PO TABS: 25.0000 mg | ORAL_TABLET | Freq: Three times a day (TID) | ORAL | Status: DC | PRN

## 2022-08-21 MED ORDER — PAROXETINE HCL 30 MG PO TABS
30.0000 mg | ORAL_TABLET | Freq: Every day | ORAL | Status: DC
  Administered 2022-08-22 – 2022-08-27 (×6): 30 mg via ORAL
  Filled 2022-08-21 (×6): qty 1

## 2022-08-21 MED ORDER — SODIUM CHLORIDE 0.9 % IV SOLN: 1.0000 g | INTRAVENOUS | Status: DC

## 2022-08-21 MED ORDER — MELATONIN 3 MG PO TABS
3.0000 mg | ORAL_TABLET | Freq: Every evening | ORAL | Status: DC | PRN
  Administered 2022-08-25 – 2022-08-26 (×2): 3 mg via ORAL
  Filled 2022-08-21 (×2): qty 1

## 2022-08-21 MED ORDER — MECLIZINE HCL 25 MG PO TABS
25.0000 mg | ORAL_TABLET | Freq: Once | ORAL | Status: AC
  Administered 2022-08-21: 25 mg via ORAL
  Filled 2022-08-21: qty 1

## 2022-08-21 MED ORDER — PANTOPRAZOLE SODIUM 40 MG PO TBEC
40.0000 mg | DELAYED_RELEASE_TABLET | Freq: Every day | ORAL | Status: DC
  Administered 2022-08-22 – 2022-08-27 (×6): 40 mg via ORAL
  Filled 2022-08-21 (×6): qty 1

## 2022-08-21 MED ORDER — ACETAMINOPHEN 650 MG RE SUPP: 650.0000 mg | Freq: Four times a day (QID) | RECTAL | Status: DC | PRN

## 2022-08-21 MED ORDER — LACTATED RINGERS IV SOLN: INTRAVENOUS | Status: AC

## 2022-08-21 MED ORDER — ROSUVASTATIN CALCIUM 20 MG PO TABS
40.0000 mg | ORAL_TABLET | Freq: Every day | ORAL | Status: DC
  Administered 2022-08-23 – 2022-08-25 (×3): 40 mg via ORAL
  Filled 2022-08-21: qty 2
  Filled 2022-08-21 (×2): qty 8

## 2022-08-21 MED ORDER — BENZONATATE 100 MG PO CAPS
200.0000 mg | ORAL_CAPSULE | Freq: Three times a day (TID) | ORAL | Status: DC | PRN
  Administered 2022-08-22: 200 mg via ORAL
  Filled 2022-08-21: qty 2

## 2022-08-21 NOTE — ED Provider Notes (Signed)
  Physical Exam  BP (!) 152/72   Pulse 96   Temp 98.3 F (36.8 C) (Oral)   Resp (!) 21   Ht 5\' 5"  (1.651 m)   Wt 99.8 kg   SpO2 94%   BMI 36.61 kg/m   Physical Exam Vitals and nursing note reviewed.  HENT:     Head: Normocephalic and atraumatic.     Right Ear: There is impacted cerumen.     Mouth/Throat:     Mouth: Mucous membranes are moist.  Eyes:     Pupils: Pupils are equal, round, and reactive to light.  Cardiovascular:     Rate and Rhythm: Normal rate.  Pulmonary:     Effort: Pulmonary effort is normal.  Abdominal:     General: Abdomen is flat.  Musculoskeletal:     Cervical back: Normal range of motion and neck supple.  Skin:    General: Skin is warm and dry.  Neurological:     Mental Status: She is alert and oriented to person, place, and time.     Procedures  Procedures  ED Course / MDM    Medical Decision Making Amount and/or Complexity of Data Reviewed Labs: ordered. Radiology: ordered.  Risk Prescription drug management.   Patient care assumed from Amjad A. at shift change, please see his note for full HPI.  Briefly, patient here with weakness and syncopal episode which began yesterday.  She felt lightheaded.  Did have some dizziness along with vomiting at the facility.  Plan is for pending CT angio, and ambulation along with disposition home.  CT Angio chest showed: 1. No acute intrathoracic, abdominal, or pelvic pathology.  2. Similar appearance of ascending aortic aneurysm measuring up to  5.3 cm. Ascending thoracic aortic aneurysm. Recommend semi-annual  imaging followup by CTA or MRA and referral to cardiothoracic  surgery if not already obtained. This recommendation follows 2010  ACCF/AHA/AATS/ACR/ASA/SCA/SCAI/SIR/STS/SVM Guidelines for the  Diagnosis and Management of Patients With Thoracic Aortic Disease.  Circulation. 2010; 121: Z610-R604. Aortic aneurysm NOS  (ICD10-I71.9). No aortic dissection.  3. A 3 cm focal subpleural nodular  consolidation in the right middle  lobe may represent atelectasis/scarring. Developing infiltrate or an  area of infarct are not excluded. Attention on follow-up imaging  recommended.  4. Cirrhosis.  5. Constipation. No bowel obstruction.  6.  Aortic Atherosclerosis (ICD10-I70.0).   8:47 PM Patient was ambulated by nursing staff, patient cannot actually ambulate due to feeling so dizzy, she was given meclizine.  But I do feel that it is reasonable to obtain an MRI brain to rule out any acute infarct.  Daughter reports that patient is currently living in an independent living facility, therefore this is what she wanted to take at home.  However, patient does have concerning findings of pneumonia on her imaging, her oxygen saturation has been waxing and waning and has dropped down to 91% with ambulation.  I do feel that patient needs criteria for admission for treatment of CAP, along with syncopal workup.   9:02 PM Spoke to Dr. Arlean Hopping, hospitalist service, appreciate his assistance.  Patient will be admitted for community-acquired pneumonia, along with ongoing weakness.  She is hemodynamically stable for admission.    Portions of this note were generated with Scientist, clinical (histocompatibility and immunogenetics). Dictation errors may occur despite best attempts at proofreading.     Claude Manges, PA-C 08/21/22 2110    Rolan Bucco, MD 08/21/22 2258

## 2022-08-21 NOTE — Progress Notes (Signed)
Patient is being admitted. Please see Initial note in regards to HH/ DME is still needed.

## 2022-08-21 NOTE — ED Notes (Signed)
Pt's visitor to nurses station asking what we are waiting on. I advised that she has an MRI ordered. Visitor requests that MRI be put off until tomorrow. RN Morrell Riddle contacted MRI who advised they will defer her scan until tomorrow.

## 2022-08-21 NOTE — ED Notes (Signed)
Attempted to ambulate pt with assistance of NT [place]. Pt was able to move to sitting position on side of bed with assistance, however stated that she could not move due to feeling like the room was spinning. Pt's oxygen level decreased to 91% and pt's HR was 113. Pt was placed back in bed in position of comfort (on her right side) and reconnected to all monitoring devices. Pt then tells her visitor that she feels like she is about to pass out. Paramedic [place] notified and came to bedside. PA notified of same.

## 2022-08-21 NOTE — ED Triage Notes (Signed)
BIBA, facility reports she complained of weakness and dizziness since yesterday. Today EMS was called and states on arrival she stood up, had a syncopal episode and had a witnessed syncopal episode in their presence the woke up and started projectile vomiting. Arrived with IV  in right forearm 20g. Complains of weakness on arrival.

## 2022-08-21 NOTE — Progress Notes (Addendum)
Transition of Care [employer]) - Emergency Department Mini Assessment   Patient Details  Name: Jenny Acosta MRN: [id number] Date of Birth: 1945/08/30  [facility] CM/SW Contact:    Georgie Chard, LCSW Phone Number: 08/21/2022, 7:54 PM   Clinical Narrative:  Addend@ 8:03  This CSW has received a text from [facility] saying that they may not get to the patient until next week. This CSW will attempt to find an [facility] agency that may can service sooner. This CSW has sent request to the following. If no reply from agencies this CSW will keep [facility].   [facility]-reviewing at this time  [facility]- N/A  [facility]- N/ A [facility]- N/A   At this time [facility] can service the patient. This CSW will inform the family.    CSW spoke to the daughter alongside the patient. At this time the daughter has stated that the mom was at [facility] rehab/ nursing home facility. The daughter reported to this CSW that the mother will come home  with her however at this time the daughter has expressed concerns about the mother not being able to walk. The daughter has requested home health as well DME/ Wheel-chair. This CSW has contacted Elnita Maxwell from  [facility] to arrange [facility]. The wheel chair will have to be ordered and delivered tomorrow as it is after hours. This CSW will leave a hand off for [facility] staff in the AM to order and have delivered if DC tonight. The daughter is aware of how this process works and expresses understanding. At this time TOC will continue to follow through duration of shift.    ED Mini Assessment: What brought you to the Emergency Department? : Patient was dizzy and fell out.  Barriers to Discharge: No Barriers Identified  Barrier interventions: Patient needs wheelchair Home Health  Means of departure: Not know  Interventions which prevented an admission or readmission: Home Health Consult or Services    Patient Contact and Communications       Contact Date: 08/21/22,       Call  outcome: Spoke to daughter in ED  Patient states their goals for this hospitalization and ongoing recovery are:: Daughter would like the patient to come home.      Admission diagnosis:  Syncope Patient Active Problem List   Diagnosis Date Noted   Hoarseness 06/19/2022   Chronic pain 06/17/2022   Bilateral knee pain 06/17/2022   Vitamin D deficiency 05/27/2021   Gingivitis 05/27/2021   Hypersomnolence 05/27/2021   Bilateral hearing loss 05/27/2021   Ascending aorta dilatation (HCC) 10/21/2020   Bilateral sciatica 02/27/2020   Dyspnea 01/11/2019   Cough 10/27/2017   Acute sinus infection 08/07/2015   Eustachian tube dysfunction 08/07/2015   Bilateral arm weakness 08/07/2015   Left otitis media 03/27/2015   Insomnia 10/24/2014   External otitis of right ear 12/18/2013   Right-sided Bell's palsy 12/18/2013   Fibromyalgia 07/21/2013   Headache 07/21/2013   Family history of colon cancer 07/21/2013   Sacroiliac dysfunction 03/15/2013   Subacromial bursitis 02/09/2013   Polyarthralgia 01/18/2013   Bilateral shoulder pain 01/18/2013   Bilateral hand pain 01/18/2013   Vertigo 07/24/2011   Back pain 02/27/2011   Ocular migraine 08/30/2010   Anxiety 08/30/2010   Right lumbar radiculopathy 08/30/2010   Hyperlipidemia 08/30/2010   Encounter for well adult exam with abnormal findings 08/30/2010   Fatigue 08/30/2010   Impaired glucose tolerance    Carotid stenosis    Hypertension    GERD (gastroesophageal reflux disease)  Allergic rhinitis    Hypothyroidism    PCP:  [name], MD Pharmacy:   [facility] [phone] - [place], [place] - [address] [facility] [address] [place] [address] Phone: [phone] Fax: [phone]

## 2022-08-21 NOTE — ED Notes (Signed)
Family requested MRI be done tomorrow

## 2022-08-21 NOTE — H&P (Signed)
History and Physical      Jenny Acosta LOV:[id number] DOB: 06/23/1945 DOA: 08/21/2022; DOS: 08/21/2022  PCP: [name], MD  Patient coming from: Independent living facility  I have personally briefly reviewed patient's old medical records in [facility]  Chief Complaint: Generalized weakness  HPI: Jenny Acosta is a 77 y.o. female with medical history significant for ascending thoracic aortic aneurysm, hyperlipidemia, acquired hypothyroidism, who is admitted to [facility] on 08/21/2022 with community-acquired pneumonia after presenting from independent living facility to [facility] ED complaining of generalized weakness.   The patient reports 3 to 4 days of generalized weakness in the absence of any acute focal weakness, numbness, paresthesias, dysphagia, acute change in vision, slurred speech, facial droop, or headache.  Over the timeframe, she is also noted new onset nonproductive cough as well as mild shortness of breath, in the absence of any orthopnea, PND, or worsening of peripheral edema.  Not associate with any chest pain, palpitations, diaphoresis.  She does however note associated subjective fever, in the absence of chills, full body rigors, or generalized myalgias.  No hemoptysis, or new calf tenderness or new lower extremity erythema.  She conveys a history of allergic rhinitis, but notes no significant worsening in her rhinitis/rhinorrhea over the last few days.  Additionally, the patient reports 1 day recurrent vertigo, which she describes as the sensation of the room spinning.  She notes that this sensation is exacerbated with certain movements of the head, including rotational movements, and notes improvement when laying flat.  Vertigo seems to worsen/be prompted by ambulation, and, consequently she notes that this vertigo is complicating her ambulatory abilities, noting that she lives at an independent living, and at baseline is able to ambulate without assistance.     She conveys concern regarding increased fall risk as result of this new onset vertigo, noting that while attempting to walk with the vertigo earlier today she experienced a ground-level fall, in which she did hit her head on the floor as a result.  She does not believe that she lost consciousness as result of this fall, although she is not entirely sure.  Denies any ensuing headache or new neck pain as a consequence of this fall.  She is not on any blood thinners as an outpatient, including no aspirin.  Denies any resultant acute arthralgias or myalgias as a result of the above fall.  She notes that the vertigo that she has been experiencing over the course the last day, has not been associated any acute hearing loss or new tinnitus.  She notes a prior history of vertigo, which she conveys was similar to that with which she presents today.  Per chart review, appears that she had a documented history of vertigo back in 2013, which resolved spontaneously.  Denies any recent dysuria or gross hematuria.  Her medical history is notable for ascending thoracic aortic aneurysm.    ED Course:  Vital signs in the ED were notable for the following: Afebrile; heart rates in the 90s to low 100s; stop blood pressures in the 130s 150s; respiratory rate 18-21, oxygen saturation 91 to 94% on room air.  Labs were notable for the following: CMP notable for the following: Sodium 140, bicarbonate 26, creatinine 0.8, glucose 125, liver enzymes within normal limits.  High sensitive troponin I 3.  CBC notable for white blood cell count 9900 with 69% neutrophils, hemoglobin 15.1.  Urinalysis showed 6-10 white blood cells, negative nitrate/leukocyte esterase, no stones epithelial cells, and demonstrated  specific gravity of 1.021.  COVID, influenza, RSV PCR were all negative.  Per my interpretation, EKG in ED demonstrated the following:  (No EKG performed today).  Imaging performed in the ED today, per formal radiology  reads, were notable for the following: Noncontrast CTh no evidence of acute intracranial process, including no evidence of intracranial hemorrhage or any evidence of acute infarct.  CT cervical spine showed no evidence of cervical spine fracture or subluxation injury.  After today's chest x-ray showed interval widening of the mediastinum, CTA chest, abdomen, pelvis was pursued and showed similar appearance of known ascending thoracic aortic aneurysm, measuring up to 5.3 cm, without corresponding evidence of dissection, with radiology recommendation to pursue semiannual imaging follow-up by way of CTA or MRA.  CTA also showed evidence of right middle lobe airspace opacity concerning for infiltrate, in the absence of any evidence of edema, pleural effusion, or pneumothorax.  To further evaluate patient's vertigo, EDP ordered MRI of the brain, which is currently pending.  While in the ED, the following were administered: Meclizine 25 mg p.o. x 1 dose, Zofran 4 mg IV x 1, Rocephin.  Subsequently, the patient was admitted for further evaluation and management of present community-acquired pneumonia complicated by generalized weakness with presentation also notable for vertigo resulting in ground-level fall, with clinical evidence of dehydration.    Review of Systems: As per HPI otherwise 10 point review of systems negative.   Past Medical History:  Diagnosis Date   Allergic rhinitis, cause unspecified    Arthritis    Carotid stenosis    Family history of colon cancer 07/21/2013   mother   Gallstone    GERD (gastroesophageal reflux disease)    Hyperlipidemia 08/30/2010   Hypertension    Hypothyroidism    Impaired glucose tolerance    Pain, coccyx    Right lumbar radiculopathy 08/30/2010    Past Surgical History:  Procedure Laterality Date   APPENDECTOMY  1953   CHOLECYSTECTOMY  2002   CHOLECYSTECTOMY OPEN  10 years ago   scarlet fever     age 35   TONSILLECTOMY  22    Social History:   reports that she has never smoked. She has never used smokeless tobacco. She reports that she does not drink alcohol and does not use drugs.   Allergies  Allergen Reactions   Lipitor [Atorvastatin]     myalgia   Methocarbamol Other (See Comments)    hallucinations   Simvastatin     Hair loss   Sulfa Antibiotics     unknown    Family History  Problem Relation Age of Onset   Colon cancer Mother    Stroke Mother    Arthritis Sister    Atrial fibrillation Sister        has pacemaker   Diabetes Maternal Grandmother    Diabetes Daughter    Stomach cancer Neg Hx     Family history reviewed and not pertinent    Prior to Admission medications   Medication Sig Start Date End Date Taking? Authorizing Provider  acetaminophen (TYLENOL) 500 MG tablet Take 500 mg by mouth every 6 (six) hours as needed. For pain    [provider]  albuterol (VENTOLIN HFA) 108 (90 Base) MCG/ACT inhaler TAKE 2 PUFFS BY MOUTH EVERY 6 HOURS AS NEEDED FOR WHEEZE OR SHORTNESS OF BREATH 08/14/22   [name], MD  Cholecalciferol (THERA-D 2000) 50 MCG (2000 UT) TABS 1 tab by mouth epr day 02/27/20   Oliver Barre  W, MD  DHA-EPA-Vitamin E (OMEGA-3 COMPLEX PO) Take by mouth.    [provider]  guaiFENesin (MUCUS RELIEF ADULT PO) Take by mouth.    [provider]  levothyroxine (SYNTHROID) 100 MCG tablet Take 1 tablet (100 mcg total) by mouth daily. 06/18/22   [name], MD  multivitamin-lutein [facility]) CAPS capsule Take 1 capsule by mouth daily.    [provider]  nortriptyline (PAMELOR) 10 MG capsule 2 TAB BY MOUTH ONCE IN THE AM, THEN 1-3 TABS BY MOUTH AT BEDTIME AS NEEDED FOR PAIN AND SLEEP 07/04/22   [name], MD  ondansetron (ZOFRAN) 4 MG tablet TAKE 1 TABLET BY MOUTH EVERY 8 HOURS AS NEEDED FOR NAUSEA AND VOMITING 07/23/22   [name], MD  pantoprazole (PROTONIX) 40 MG tablet TAKE 1 TABLET BY MOUTH EVERY DAY 04/02/22   [name], MD  PARoxetine (PAXIL) 30  MG tablet TAKE 1 TABLET BY MOUTH EVERY DAY 04/02/22   [name], MD  rosuvastatin (CRESTOR) 40 MG tablet Take 1 tablet (40 mg total) by mouth daily. 06/18/22   [name], MD  tiZANidine (ZANAFLEX) 4 MG tablet TAKE 1 TABLET BY MOUTH THREE TIMES A DAY AS NEEDED 02/07/22   [name], MD  traMADol (ULTRAM) 50 MG tablet Take 1 tablet (50 mg total) by mouth every 6 (six) hours as needed. 06/17/22   [name], MD  triamcinolone (NASACORT) 55 MCG/ACT AERO nasal inhaler Place 2 sprays into the nose daily. 01/11/19   [name], MD  Triprolidine-Pseudoephedrine (ANTIHISTAMINE PO) Take by mouth.    [provider]     Objective    Physical Exam: Vitals:   08/21/22 1445 08/21/22 1644 08/21/22 1730 08/21/22 2030  BP: 133/73  (!) 171/112 (!) 152/72  Pulse: 94  (!) 101 96  Resp: 20  19 (!) 21  Temp:  98.3 F (36.8 C)    TempSrc:  Oral    SpO2: 99%  97% 94%  Weight:      Height:        General: appears to be stated age; alert, oriented Skin: warm, dry, no rash Head:  AT/[place] Mouth:  Oral mucosa membranes appear dry, normal dentition Neck: supple; trachea midline Heart: Mildly tachycardic, but regular; did not appreciate any M/R/G Lungs: CTAB, did not appreciate any wheezes, rales, or rhonchi Abdomen: + BS; soft, ND, NT Vascular: 2+ pedal pulses b/l; 2+ radial pulses b/l Extremities: no peripheral edema, no muscle wasting Neuro: strength and sensation intact in upper and lower extremities b/l; no nystagmus noted    Labs on Admission: I have personally reviewed following labs and imaging studies  CBC: Recent Labs  Lab 08/21/22 1314  WBC 9.9  NEUTROABS 6.8  HGB 15.1*  HCT 47.6*  MCV 89.1  PLT 238   Basic Metabolic Panel: Recent Labs  Lab 08/21/22 1314  NA 140  K 3.6  CL 101  CO2 26  GLUCOSE 125*  BUN 14  CREATININE 0.80  CALCIUM 9.1  MG 2.3   GFR: Estimated Creatinine Clearance: 68.9 mL/min (by C-G formula based on SCr of 0.8 mg/dL). Liver  Function Tests: Recent Labs  Lab 08/21/22 1314  AST 28  ALT 27  ALKPHOS 118  BILITOT 0.6  PROT 8.2*  ALBUMIN 4.1   No results for input(s): "LIPASE", "AMYLASE" in the last 168 hours. Recent Labs  Lab 08/21/22 1329  AMMONIA 13   Coagulation Profile: No results for input(s): "INR", "  PROTIME" in the last 168 hours. Cardiac Enzymes: No results for input(s): "CKTOTAL", "CKMB", "CKMBINDEX", "TROPONINI" in the last 168 hours. BNP (last 3 results) No results for input(s): "PROBNP" in the last 8760 hours. HbA1C: No results for input(s): "HGBA1C" in the last 72 hours. CBG: Recent Labs  Lab 08/21/22 1302  GLUCAP 118*   Lipid Profile: No results for input(s): "CHOL", "HDL", "LDLCALC", "TRIG", "CHOLHDL", "LDLDIRECT" in the last 72 hours. Thyroid Function Tests: No results for input(s): "TSH", "T4TOTAL", "FREET4", "T3FREE", "THYROIDAB" in the last 72 hours. Anemia Panel: No results for input(s): "VITAMINB12", "FOLATE", "FERRITIN", "TIBC", "IRON", "RETICCTPCT" in the last 72 hours. Urine analysis:    Component Value Date/Time   COLORURINE YELLOW 08/21/2022 1400   APPEARANCEUR CLEAR 08/21/2022 1400   LABSPEC 1.021 08/21/2022 1400   PHURINE 5.0 08/21/2022 1400   GLUCOSEU NEGATIVE 08/21/2022 1400   GLUCOSEU NEGATIVE 01/11/2019 1645   HGBUR NEGATIVE 08/21/2022 1400   BILIRUBINUR NEGATIVE 08/21/2022 1400   KETONESUR NEGATIVE 08/21/2022 1400   PROTEINUR 30 (A) 08/21/2022 1400   UROBILINOGEN 0.2 01/11/2019 1645   NITRITE NEGATIVE 08/21/2022 1400   LEUKOCYTESUR NEGATIVE 08/21/2022 1400    Radiological Exams on Admission: CT Angio Chest/Abd/Pel for Dissection W and/or Wo Contrast  Result Date: 08/21/2022 CLINICAL DATA:  Acute aortic syndrome suspected. EXAM: CT ANGIOGRAPHY CHEST, ABDOMEN AND PELVIS TECHNIQUE: Non-contrast CT of the chest was initially obtained. Multidetector CT imaging through the chest, abdomen and pelvis was performed using the standard protocol during bolus  administration of intravenous contrast. Multiplanar reconstructed images and MIPs were obtained and reviewed to evaluate the vascular anatomy. RADIATION DOSE REDUCTION: This exam was performed according to the departmental dose-optimization program which includes automated exposure control, adjustment of the mA and/or kV according to patient size and/or use of iterative reconstruction technique. CONTRAST:  OMNIPAQUE IOHEXOL 350 MG/ML SOLN COMPARISON:  Chest CT dated 11/06/2017 and CT abdomen pelvis dated 08/03/2010. FINDINGS: Evaluation of this exam is limited due to respiratory motion artifact. CTA CHEST FINDINGS Cardiovascular: There is no cardiomegaly or pericardial effusion. Coronary vascular calcification predominately involving the LAD and RCA. Similar appearance of ascending aortic aneurysm measuring up to 5.3 cm. No aortic dissection. There is moderate atherosclerotic calcification of the thoracic aorta. The origins of the great vessels of the aortic arch appear patent. Evaluation of the pulmonary arteries is limited due to respiratory motion. No central pulmonary artery embolus identified. Mediastinum/Nodes: No hilar or mediastinal adenopathy. The esophagus is grossly unremarkable. No mediastinal fluid collection. Lungs/Pleura: Faint 2 mm left apical nodule (19/9) similar to prior CT. Mild diffuse interstitial coarsening and areas of air trapping may be related to underlying small vessel versus small airway disease. A 3 cm focal subpleural nodular consolidation in the right middle lobe may represent atelectasis/scarring. Developing infiltrate or an area of infarct are not excluded. Attention on follow-up imaging recommended. There is no pleural effusion or pneumothorax. The central airways are patent. Musculoskeletal: Osteopenia with degenerative changes of the spine. No acute osseous pathology. Review of the MIP images confirms the above findings. CTA ABDOMEN AND PELVIS FINDINGS VASCULAR Aorta:  Advanced atherosclerotic calcification of the abdominal aorta. No aneurysmal dilatation or dissection. No periaortic fluid collection. Celiac: The celiac trunk and its major branches are patent. No aneurysmal dilatation or dissection. SMA: The SMA is patent.  No aneurysmal dilatation or dissection. Renals: The renal arteries are patent.  No aneurysm. IMA: The IMA is patent. Inflow: Moderate atherosclerotic calcification of the iliac arteries. No aneurysmal dilatation or dissection.  The iliac arteries are patent bilaterally. Veins: No obvious venous abnormality within the limitations of this arterial phase study. Review of the MIP images confirms the above findings. NON-VASCULAR No intra-abdominal free air or free fluid. Hepatobiliary: There is irregularity of the liver contour suggestive of cirrhosis. Clinical correlation is recommended. No biliary dilatation. Cholecystectomy. Pancreas: Unremarkable. No pancreatic ductal dilatation or surrounding inflammatory changes. Spleen: Normal in size without focal abnormality. Adrenals/Urinary Tract: The adrenal glands unremarkable. There is no hydronephrosis on either side. Symmetric excretion of contrast by both kidneys. The visualized ureters and urinary bladder appear unremarkable. Stomach/Bowel: There is large amount of stool throughout the colon. No bowel obstruction or active inflammation. The appendix is not visualized with certainty. No inflammatory changes identified in the right lower quadrant. Lymphatic: No adenopathy. Reproductive: The uterus is anteverted and grossly unremarkable. No adnexal masses. Other: None Musculoskeletal: Osteopenia with degenerative changes of the spine. Grade 1 L4-L5 anterolisthesis. L2 hemangioma. No acute osseous pathology. Review of the MIP images confirms the above findings. IMPRESSION: 1. No acute intrathoracic, abdominal, or pelvic pathology. 2. Similar appearance of ascending aortic aneurysm measuring up to 5.3 cm. Ascending  thoracic aortic aneurysm. Recommend semi-annual imaging followup by CTA or MRA and referral to cardiothoracic surgery if not already obtained. This recommendation follows 2010 ACCF/AHA/AATS/ACR/ASA/SCA/SCAI/SIR/STS/SVM Guidelines for the Diagnosis and Management of Patients With Thoracic Aortic Disease. Circulation. 2010; 121: G315-V761. Aortic aneurysm NOS (ICD10-I71.9). No aortic dissection. 3. A 3 cm focal subpleural nodular consolidation in the right middle lobe may represent atelectasis/scarring. Developing infiltrate or an area of infarct are not excluded. Attention on follow-up imaging recommended. 4. Cirrhosis. 5. Constipation. No bowel obstruction. 6.  Aortic Atherosclerosis (ICD10-I70.0). Electronically Signed   By: Elgie Collard M.D.   On: 08/21/2022 18:37   CT Head Wo Contrast  Result Date: 08/21/2022 CLINICAL DATA:  Mental status change, unknown cause; Polytrauma, blunt EXAM: CT HEAD WITHOUT CONTRAST CT CERVICAL SPINE WITHOUT CONTRAST TECHNIQUE: Multidetector CT imaging of the head and cervical spine was performed following the standard protocol without intravenous contrast. Multiplanar CT image reconstructions of the cervical spine were also generated. RADIATION DOSE REDUCTION: This exam was performed according to the departmental dose-optimization program which includes automated exposure control, adjustment of the mA and/or kV according to patient size and/or use of iterative reconstruction technique. COMPARISON:  CT Chest 11/06/17 FINDINGS: CT HEAD FINDINGS Brain: Chronic infarct in the right parietal temporal region sequela of moderate microvascular ischemic change. No CT evidence of an acute cortical infarct. No hemorrhage. No hydrocephalus. No extra-axial fluid collection generalized volume loss Vascular: No hyperdense vessel or unexpected calcification. Skull: Normal. Negative for fracture or focal lesion. Sinuses/Orbits: No middle ear or mastoid effusion air-fluid level in the right  sphenoid sinus orbits are notable for bilateral lens replacement, otherwise unremarkable. Other: None. CT CERVICAL SPINE FINDINGS Alignment: Straightening of the normal cervical lordosis. Skull base and vertebrae: No acute cervical spine fracture. No primary bone lesion or focal pathologic process. Likely an osseous hemangioma at T2. possible acute fracture of the left T2 transverse process (series 4, image 26) Soft tissues and spinal canal: No prevertebral fluid or swelling. No visible canal hematoma. Disc levels:  No evidence of high-grade spinal canal stenosis Upper chest: Biapical ground-glass opacities are favored to represent infectious or inflammatory process. Other: None IMPRESSION: 1. No acute intracranial abnormality. Chronic infarct in the right parietotemporal region. 2. No acute cervical spine fracture. Possible acute fracture of the left T2 transverse process. 3. Biapical ground-glass opacities are  favored to represent infectious or inflammatory process. Electronically Signed   By: Lorenza Cambridge M.D.   On: 08/21/2022 14:23   CT Cervical Spine Wo Contrast  Result Date: 08/21/2022 CLINICAL DATA:  Mental status change, unknown cause; Polytrauma, blunt EXAM: CT HEAD WITHOUT CONTRAST CT CERVICAL SPINE WITHOUT CONTRAST TECHNIQUE: Multidetector CT imaging of the head and cervical spine was performed following the standard protocol without intravenous contrast. Multiplanar CT image reconstructions of the cervical spine were also generated. RADIATION DOSE REDUCTION: This exam was performed according to the departmental dose-optimization program which includes automated exposure control, adjustment of the mA and/or kV according to patient size and/or use of iterative reconstruction technique. COMPARISON:  CT Chest 11/06/17 FINDINGS: CT HEAD FINDINGS Brain: Chronic infarct in the right parietal temporal region sequela of moderate microvascular ischemic change. No CT evidence of an acute cortical infarct. No  hemorrhage. No hydrocephalus. No extra-axial fluid collection generalized volume loss Vascular: No hyperdense vessel or unexpected calcification. Skull: Normal. Negative for fracture or focal lesion. Sinuses/Orbits: No middle ear or mastoid effusion air-fluid level in the right sphenoid sinus orbits are notable for bilateral lens replacement, otherwise unremarkable. Other: None. CT CERVICAL SPINE FINDINGS Alignment: Straightening of the normal cervical lordosis. Skull base and vertebrae: No acute cervical spine fracture. No primary bone lesion or focal pathologic process. Likely an osseous hemangioma at T2. possible acute fracture of the left T2 transverse process (series 4, image 26) Soft tissues and spinal canal: No prevertebral fluid or swelling. No visible canal hematoma. Disc levels:  No evidence of high-grade spinal canal stenosis Upper chest: Biapical ground-glass opacities are favored to represent infectious or inflammatory process. Other: None IMPRESSION: 1. No acute intracranial abnormality. Chronic infarct in the right parietotemporal region. 2. No acute cervical spine fracture. Possible acute fracture of the left T2 transverse process. 3. Biapical ground-glass opacities are favored to represent infectious or inflammatory process. Electronically Signed   By: Lorenza Cambridge M.D.   On: 08/21/2022 14:23   DG Chest Portable 1 View  Result Date: 08/21/2022 CLINICAL DATA:  Weakness.  Dizziness.  Known aneurysm EXAM: PORTABLE CHEST 1 VIEW COMPARISON:  X-ray 10/27/2017. FINDINGS: Increasing widening of the mediastinum. Borderline cardiopericardial silhouette. No consolidation, pneumothorax or effusion. No edema. Films are under penetrated. Overlapping cardiac leads. IMPRESSION: Increasing widening of the mediastinum. With patient's history recommend further workup with chest CT when clinically appropriate. Electronically Signed   By: Karen Kays M.D.   On: 08/21/2022 14:02      Assessment/Plan     Principal Problem:   CAP (community acquired pneumonia) Active Problems:   GERD (gastroesophageal reflux disease)   Allergic rhinitis   Acquired hypothyroidism   Hyperlipidemia   Generalized weakness   Vertigo   Fall at home, initial encounter   Thoracic ascending aortic aneurysm (HCC)   Dehydration   Depression       #) Community-acquired pneumonia: Diagnosis on the basis of 3 to 4 days of new onset, progressive cough associated with subjective fever, generalized weakness, with CTA chest showing evidence of right middle lobe airspace opacity concerning for infiltrate.  Of note, the patient does possess tachycardia and tachypnea, in the absence of any objective fever or leukocytosis, SIRS criteria not currently met for sepsis.  Additionally, she appears hemodynamically stable.  Prior to collection of blood cultures, the patient was started on Rocephin in the ED.  Will add atypical coverage for community-acquired pneumonia, specifically with the addition of azithromycin to existing Rocephin.  No evidence of  additional underlying infectious process at this time, including negative COVID, influenza, RSV PCR, while urinalysis is also not suggestive of urinary tract infection.  Plan: Gentle IV fluids in the form of lactated Ringer's at 50 cc/h x 8 hours.  Azithromycin and Rocephin, as above.  Add on procalcitonin level.  Check strep pneumonia urine antigen.  Prn Tessalon Perles.  Flutter valve, incentive spirometry.  Repeat CMP, CBC in the morning.                #) Generalized weakness: 3 to 4-day duration of generalized weakness, in the absence of any evidence of acute focal weakness to suggest acute CVA.  She has noted to have 1 day of vertigo, this appears most consistent with benign paroxysmal positional vertigo, with MRI brain pending.  Overall, acute ischemic CVA appears less likely, will noting presenting CT head that showed no evidence of acute intracranial process,  including no evidence of acute infarct.  In terms of her generalized weakness, suspect contribution from physiologic stress stemming from presenting community-acquired pneumonia.  No e/o additional infectious process at this time, as further detailed above.  In the context of taking high intensity rosuvastatin as an outpatient, will also add on CPK level.  Additionally, she has noted to have a history of acquired hypothyroidism, for which she is on Synthroid as an outpatient.  Will consequently check TSH level.  There may also be a mild contribution from clinical evidence to suggest dehydration, as further detailed below.   Will further eval for any additional contributions from endocrine/metabolic sources, as detailed below.   Plan: work-up and management of presenting.  Acquired pneumonia, as described above. PT/OT consults ordered for the AM. Fall precautions. CMP/CBC in the AM. Check TSH, serum Mg level. Check CPK.  B12.  Gentle lactated Ringer's, as above.                #) Vertigo: 1 day of recurrent vertigo, it appears positional in nature and suggestive of benign paroxysmal positional vertigo that has been refractory to dose of Antivert in the emergency department today, resulting in associated concerns for increased fall risk as this patient lives by herself and is able to ambulate without assistance at baseline.  Of note, while CT head shows no evidence of acute intracranial process, MRI brain has been reviewed by EDP, and is currently pending.  At this time, acute ischemic CVA is suspected to be less likely, but will follow for result of MRI brain to further evaluate.  While noting a history of carotid stenosis, vertebrobasilar insufficiency appears less likely.  She does present with suspicion for community-acquired pneumonia, although the timing of her vertigo appears less suggestive of that that would be associated with labyrinthitis. Not associated with any recent/acute hearing  loss.  Additionally, no overt pharmacologic contributions.  Overall, will attempt to symptomatically manage, while pursuing consultation of vestibular physical therapy for evaluation and therapeutic intervention.    Plan: Fall precautions ordered.  Consult placed for vestibular physical therapy.  Prn Antivert.  As needed Zofran. Monitor strict I's and O's and daily weights.  Follow-up result MRI brain.                #) Ground-level fall: On level fall x 1 incurred while attempting to ambulate at independent living facility earlier today, with predisposing factors that include her presenting vertigo as well as further predisposition for fall on the basis of presenting community-acquired pneumonia, as above.  While she did hit her  head, she is not on a blood thinners as an outpatient, and CT head showed no evidence of acute intracranial process, including no evidence of ventricular hemorrhage.  Is unclear if she lost consciousness as a component of this fall, as further detailed above.  No evidence of acute fracture, the patient is denying any acute arthralgias or myalgias as a consequence of the above fall.  Will pursue further evaluation management of her presenting vertigo as well as pursue further evaluation management of her presenting community-acquired pneumonia as a suspected predisposing factor for this fall.  Plan: Fall precautions ordered.  Further evaluation management of vertigo as well as community-acquired pneumonia.  Vestibular PT consult ordered.                   #) Dehydration: Clinical suspicion for such, including the appearance of dry oral mucous membranes as well as laboratory findings notable for UA demonstrating elevated specific gravity.  Suspect contribution from ostensible losses given relative increase in ambient temperature over the last few days.  No e/o associated hypotension.   Plan: Monitor strict I's and O's.  Daily weights.  CMP in the  morning. IVF's in form of lactated Ringer's at 50 cc/h x 8 hours.                  #) GERD: documented h/o such; on Protonix as outpatient.   Plan: continue home PPI.                  #) Hyperlipidemia: documented h/o such. On high intensity rosuvastatin as outpatient.  In the context of the patient's generalized weakness, will also check CPK level.  Plan: continue home statin.  Check CPK level, as above.                 #) acquired hypothyroidism: documented h/o such, on Synthroid as outpatient.  In the setting of her presenting generalized weakness, will also check TSH level.  Plan: cont home Synthroid.  Check TSH, as above.                   #) Depression: documented h/o such. On Paxil as outpatient.    Plan: Continue outpatient Paxil.  Follow-up result of TSH.               #) Allergic Rhinitis: documented h/o such, on scheduled Nasacort as outpatient.    Plan: cont home Nasacort.              #) Ascending thoracic aortic aneurysm: Documented history of such, with today CTA chest, abdomen, pelvis showing no significant interval change, noting that her ascending thoracic aortic aneurysm currently measures up to 5.3 cm, in the absence of any corresponding evidence of dissection.  Per corresponding radiology recommendations, they recommend semiannual imaging by way of CTA or MRA.  Plan: Recommend semiannual imaging follow-up via CTA or MRA, as recommended by radiology, as above.       DVT prophylaxis: SCD's   Code Status: Full code Family Communication: none Disposition Plan: Per Rounding Team Consults called: none;  Admission status: inpatient    I SPENT GREATER THAN 75  MINUTES IN CLINICAL CARE TIME/MEDICAL DECISION-MAKING IN COMPLETING THIS ADMISSION.     Chaney Born Shreyan Hinz DO [facility] From 7PM - 7AM   08/21/2022, 9:45 PM

## 2022-08-21 NOTE — ED Provider Notes (Signed)
Jenny Acosta EMERGENCY DEPARTMENT AT [facility] Provider Note   CSN: [id number] Arrival date & time: 08/21/22  1246     History  Chief Complaint  Patient presents with   Weakness   Dizziness        Fall    Jenny Acosta is a 77 y.o. female.  77 year old female presents from nursing home for concern of weakness and syncopal episode.  Patient states weakness started yesterday.  She does have cough but states it is chronic.  Denies any shortness of breath, chest pain.  She complains of dizziness but when asked to clarify describes it as lightheadedness.  Denies any abdominal pain.  Does endorse some nausea with "dizziness".  According to facility patient had vomiting episode following the syncopal episode.  This was apparently witnessed.  Will find out which facility she is from and speak to staff.  The history is provided by the patient. No language interpreter was used.       Home Medications Prior to Admission medications   Medication Sig Start Date End Date Taking? Authorizing Provider  acetaminophen (TYLENOL) 500 MG tablet Take 500 mg by mouth every 6 (six) hours as needed. For pain    [provider]  albuterol (VENTOLIN HFA) 108 (90 Base) MCG/ACT inhaler TAKE 2 PUFFS BY MOUTH EVERY 6 HOURS AS NEEDED FOR WHEEZE OR SHORTNESS OF BREATH 08/14/22   [name], MD  Cholecalciferol (THERA-D 2000) 50 MCG (2000 UT) TABS 1 tab by mouth epr day 02/27/20   [name], MD  DHA-EPA-Vitamin E (OMEGA-3 COMPLEX PO) Take by mouth.    [provider]  guaiFENesin (MUCUS RELIEF ADULT PO) Take by mouth.    [provider]  levothyroxine (SYNTHROID) 100 MCG tablet Take 1 tablet (100 mcg total) by mouth daily. 06/18/22   [name], MD  multivitamin-lutein [facility]) CAPS capsule Take 1 capsule by mouth daily.    [provider]  nortriptyline (PAMELOR) 10 MG capsule 2 TAB BY MOUTH ONCE IN THE AM, THEN 1-3 TABS BY MOUTH AT BEDTIME AS NEEDED  FOR PAIN AND SLEEP 07/04/22   [name], MD  ondansetron (ZOFRAN) 4 MG tablet TAKE 1 TABLET BY MOUTH EVERY 8 HOURS AS NEEDED FOR NAUSEA AND VOMITING 07/23/22   [name], MD  pantoprazole (PROTONIX) 40 MG tablet TAKE 1 TABLET BY MOUTH EVERY DAY 04/02/22   [name], MD  PARoxetine (PAXIL) 30 MG tablet TAKE 1 TABLET BY MOUTH EVERY DAY 04/02/22   [name], MD  rosuvastatin (CRESTOR) 40 MG tablet Take 1 tablet (40 mg total) by mouth daily. 06/18/22   [name], MD  tiZANidine (ZANAFLEX) 4 MG tablet TAKE 1 TABLET BY MOUTH THREE TIMES A DAY AS NEEDED 02/07/22   [name], MD  traMADol (ULTRAM) 50 MG tablet Take 1 tablet (50 mg total) by mouth every 6 (six) hours as needed. 06/17/22   [name], MD  triamcinolone (NASACORT) 55 MCG/ACT AERO nasal inhaler Place 2 sprays into the nose daily. 01/11/19   [name], MD  Triprolidine-Pseudoephedrine (ANTIHISTAMINE PO) Take by mouth.    [provider]      Allergies    Lipitor [atorvastatin], Methocarbamol, Simvastatin, and Sulfa antibiotics    Review of Systems   Review of Systems  Constitutional:  Negative for chills and fever.  Respiratory:  Positive for cough. Negative for shortness of breath.   Cardiovascular:  Negative for chest pain and  palpitations.  Neurological:  Positive for weakness. Negative for syncope and light-headedness.  All other systems reviewed and are negative.   Physical Exam Updated Vital Signs BP 139/81 (BP Location: Left Arm)   Pulse 95   Temp 98 F (36.7 C) (Oral)   Resp 18   Ht 5\' 5"  (1.651 m)   Wt 99.8 kg   SpO2 93%   BMI 36.61 kg/m  Physical Exam Vitals and nursing note reviewed.  Constitutional:      General: She is not in acute distress.    Appearance: Normal appearance. She is not ill-appearing.  HENT:     Head: Normocephalic and atraumatic.     Nose: Nose normal.  Eyes:     General: No scleral icterus.    Extraocular Movements: Extraocular movements intact.      Conjunctiva/sclera: Conjunctivae normal.  Cardiovascular:     Rate and Rhythm: Normal rate and regular rhythm.     Heart sounds: Normal heart sounds.  Pulmonary:     Effort: Pulmonary effort is normal. No respiratory distress.     Breath sounds: Normal breath sounds. No wheezing or rales.  Abdominal:     General: There is no distension.     Tenderness: There is no abdominal tenderness.  Musculoskeletal:        General: Normal range of motion.     Cervical back: Normal range of motion.     Right lower leg: No edema.     Left lower leg: No edema.  Skin:    General: Skin is warm and dry.  Neurological:     General: No focal deficit present.     Mental Status: She is alert and oriented to person, place, and time. Mental status is at baseline.     Comments: Patient is alert and oriented.  Has full range of motion in bilateral upper and lower extremity with 5/5 strength.  No pronator drift.  No tongue deviation.  Normal speech.     ED Results / Procedures / Treatments   Labs (all labs ordered are listed, but only abnormal results are displayed) Labs Reviewed  CBC WITH DIFFERENTIAL/PLATELET - Abnormal; Notable for the following components:      Result Value   RBC 5.34 (*)    Hemoglobin 15.1 (*)    HCT 47.6 (*)    All other components within normal limits  CBG MONITORING, ED - Abnormal; Notable for the following components:   Glucose-Capillary 118 (*)    All other components within normal limits  RESP PANEL BY RT-PCR (RSV, FLU A&B, COVID)  RVPGX2  URINALYSIS, ROUTINE W REFLEX MICROSCOPIC  COMPREHENSIVE METABOLIC PANEL  AMMONIA  ACETAMINOPHEN LEVEL  SALICYLATE LEVEL  ETHANOL  MAGNESIUM  TROPONIN I (HIGH SENSITIVITY)    EKG None  Radiology No results found.  Procedures Procedures    Medications Ordered in ED Medications - No data to display  ED Course/ Medical Decision Making/ A&P                             Medical Decision Making Amount and/or Complexity of  Data Reviewed Labs: ordered. Radiology: ordered.   Medical Decision Making / ED Course   This patient presents to the ED for concern of weakness, syncope, this involves an extensive number of treatment options, and is a complaint that carries with it a high risk of complications and morbidity.  The differential diagnosis includes arrhythmia, pneumonia, UTI, other infectious source,  intracranial injury,  MDM: 77 year old female presents today for above-mentioned symptoms.  Overall she is well-appearing.  Has good strength on exam.  No focal neurological deficits.  Will obtain labs, CT head, and cervical spine for potential fall, and altered mental status, will obtain chest x-ray and UA.  CBC reveals no leukocytosis.  Without anemia.  CMP shows glucose of 125 otherwise without acute finding.  Salicylate, acetaminophen, ethanol levels within normal.  Magnesium 2.3.  Troponin of 3.  Ammonia of 13.  UA without evidence of UTI.  Respiratory panel negative.  EKG without acute ischemic changes.  CT head and cervical spine are without acute intracranial or cervical spinal pathology.  Chest x-ray shows potential interval inflammatory or infectious changes.  She is afebrile and without leukocytosis, without medical or other symptoms concerning for pneumonia.  It also does show mildly widened mediastinum compared to prior study.  According the patient she does have history of aneurysm.  Last evaluated about 6 months ago.  Will further evaluate with dissection study given history of syncopal episode, weakness, and aneurysm.  Patient signed out to oncoming provider awaiting dissection study.  Last measurement of the aneurysm on MRI angio was 51 mm.  Patient per chart review is pending CT angio to further evaluate.    Lab Tests: -I ordered, reviewed, and interpreted labs.   The pertinent results include:   Labs Reviewed  CBC WITH DIFFERENTIAL/PLATELET - Abnormal; Notable for the following components:       Result Value   RBC 5.34 (*)    Hemoglobin 15.1 (*)    HCT 47.6 (*)    All other components within normal limits  COMPREHENSIVE METABOLIC PANEL - Abnormal; Notable for the following components:   Glucose, Bld 125 (*)    Total Protein 8.2 (*)    All other components within normal limits  ACETAMINOPHEN LEVEL - Abnormal; Notable for the following components:   Acetaminophen (Tylenol), Serum <10 (*)    All other components within normal limits  SALICYLATE LEVEL - Abnormal; Notable for the following components:   Salicylate Lvl <7.0 (*)    All other components within normal limits  CBG MONITORING, ED - Abnormal; Notable for the following components:   Glucose-Capillary 118 (*)    All other components within normal limits  RESP PANEL BY RT-PCR (RSV, FLU A&B, COVID)  RVPGX2  ETHANOL  MAGNESIUM  URINALYSIS, ROUTINE W REFLEX MICROSCOPIC  AMMONIA  TROPONIN I (HIGH SENSITIVITY)      EKG  EKG Interpretation Date/Time:    Ventricular Rate:    PR Interval:    QRS Duration:    QT Interval:    QTC Calculation:   R Axis:      Text Interpretation:           Imaging Studies ordered: I ordered imaging studies including chest x-ray.  CT a dissection study ordered however pending at the time of signout I independently visualized and interpreted imaging. I agree with the radiologist interpretation   Medicines ordered and prescription drug management: No orders of the defined types were placed in this encounter.   -I have reviewed the patients home medicines and have made adjustments as needed   Reevaluation: After the interventions noted above, I reevaluated the patient and found that they have :stayed the same  Co morbidities that complicate the patient evaluation  Past Medical History:  Diagnosis Date   Allergic rhinitis, cause unspecified    Arthritis    Carotid stenosis    Family  history of colon cancer 07/21/2013   mother   Gallstone    GERD (gastroesophageal reflux  disease)    Hyperlipidemia 08/30/2010   Hypertension    Hypothyroidism    Impaired glucose tolerance    Pain, coccyx    Right lumbar radiculopathy 08/30/2010      Dispostion: Patient signed out to oncoming team to follow-up on dissection study.  Final Clinical Impression(s) / ED Diagnoses Final diagnoses:  None    Rx / DC Orders ED Discharge Orders     None         Marita Kansas, PA-C 08/21/22 1522    Tegeler, Canary Brim, MD 08/22/22 [id number]

## 2022-08-22 ENCOUNTER — Inpatient Hospital Stay (HOSPITAL_COMMUNITY): Payer: 59

## 2022-08-22 DIAGNOSIS — I639 Cerebral infarction, unspecified: Secondary | ICD-10-CM

## 2022-08-22 DIAGNOSIS — I63531 Cerebral infarction due to unspecified occlusion or stenosis of right posterior cerebral artery: Secondary | ICD-10-CM | POA: Diagnosis not present

## 2022-08-22 DIAGNOSIS — J189 Pneumonia, unspecified organism: Secondary | ICD-10-CM | POA: Diagnosis not present

## 2022-08-22 LAB — CBC WITH DIFFERENTIAL/PLATELET
Abs Immature Granulocytes: 0.04 10*3/uL (ref 0.00–0.07)
Basophils Absolute: 0 10*3/uL (ref 0.0–0.1)
Basophils Relative: 0 %
Eosinophils Absolute: 0 10*3/uL (ref 0.0–0.5)
Eosinophils Relative: 0 %
HCT: 47.5 % — ABNORMAL HIGH (ref 36.0–46.0)
Hemoglobin: 15.2 g/dL — ABNORMAL HIGH (ref 12.0–15.0)
Immature Granulocytes: 0 %
Lymphocytes Relative: 34 %
Lymphs Abs: 3.3 10*3/uL (ref 0.7–4.0)
MCH: 28.3 pg (ref 26.0–34.0)
MCHC: 32 g/dL (ref 30.0–36.0)
MCV: 88.5 fL (ref 80.0–100.0)
Monocytes Absolute: 1 10*3/uL (ref 0.1–1.0)
Monocytes Relative: 10 %
Neutro Abs: 5.4 10*3/uL (ref 1.7–7.7)
Neutrophils Relative %: 56 %
Platelets: 253 10*3/uL (ref 150–400)
RBC: 5.37 MIL/uL — ABNORMAL HIGH (ref 3.87–5.11)
RDW: 14.3 % (ref 11.5–15.5)
WBC: 9.8 10*3/uL (ref 4.0–10.5)
nRBC: 0 % (ref 0.0–0.2)

## 2022-08-22 LAB — COMPREHENSIVE METABOLIC PANEL
ALT: 27 U/L (ref 0–44)
AST: 29 U/L (ref 15–41)
Albumin: 4 g/dL (ref 3.5–5.0)
Alkaline Phosphatase: 116 U/L (ref 38–126)
Anion gap: 12 (ref 5–15)
BUN: 10 mg/dL (ref 8–23)
CO2: 23 mmol/L (ref 22–32)
Calcium: 8.9 mg/dL (ref 8.9–10.3)
Chloride: 101 mmol/L (ref 98–111)
Creatinine, Ser: 0.58 mg/dL (ref 0.44–1.00)
GFR, Estimated: >60 mL/min (ref >60)
Glucose, Bld: 110 mg/dL — ABNORMAL HIGH (ref 70–99)
Potassium: 3.7 mmol/L (ref 3.5–5.1)
Sodium: 136 mmol/L (ref 135–145)
Total Bilirubin: 0.7 mg/dL (ref 0.3–1.2)
Total Protein: 8 g/dL (ref 6.5–8.1)

## 2022-08-22 LAB — MAGNESIUM: Magnesium: 2.4 mg/dL (ref 1.7–2.4)

## 2022-08-22 LAB — VITAMIN B12: Vitamin B-12: 627 pg/mL (ref 180–914)

## 2022-08-22 LAB — PROCALCITONIN: Procalcitonin: <0.1 ng/mL

## 2022-08-22 LAB — TSH: TSH: 0.538 u[IU]/mL (ref 0.350–4.500)

## 2022-08-22 LAB — STREP PNEUMONIAE URINARY ANTIGEN: Strep Pneumo Urinary Antigen: NEGATIVE

## 2022-08-22 LAB — CK: Total CK: 82 U/L (ref 38–234)

## 2022-08-22 MED ORDER — POLYETHYLENE GLYCOL 3350 17 G PO PACK
17.0000 g | PACK | Freq: Every day | ORAL | Status: DC
  Administered 2022-08-23 – 2022-08-25 (×3): 17 g via ORAL
  Filled 2022-08-22 (×6): qty 1

## 2022-08-22 MED ORDER — ASPIRIN 300 MG RE SUPP
300.0000 mg | Freq: Every day | RECTAL | Status: DC
  Administered 2022-08-22: 300 mg via RECTAL
  Filled 2022-08-22: qty 1

## 2022-08-22 MED ORDER — ASPIRIN 325 MG PO TABS: 325.0000 mg | ORAL_TABLET | Freq: Every day | ORAL | Status: DC

## 2022-08-22 MED ORDER — CLOPIDOGREL BISULFATE 75 MG PO TABS
75.0000 mg | ORAL_TABLET | Freq: Every day | ORAL | Status: DC
  Administered 2022-08-22 – 2022-08-27 (×6): 75 mg via ORAL
  Filled 2022-08-22 (×6): qty 1

## 2022-08-22 MED ORDER — SODIUM CHLORIDE (PF) 0.9 % IJ SOLN
INTRAMUSCULAR | Status: AC
  Filled 2022-08-22: qty 50

## 2022-08-22 MED ORDER — ASPIRIN 81 MG PO CHEW
81.0000 mg | CHEWABLE_TABLET | Freq: Every day | ORAL | Status: DC
  Administered 2022-08-23 – 2022-08-27 (×5): 81 mg via ORAL
  Filled 2022-08-22 (×5): qty 1

## 2022-08-22 MED ORDER — BUTALBITAL-APAP-CAFFEINE 50-325-40 MG PO TABS
1.0000 | ORAL_TABLET | Freq: Four times a day (QID) | ORAL | Status: AC | PRN
  Administered 2022-08-22 (×2): 1 via ORAL
  Filled 2022-08-22 (×2): qty 1

## 2022-08-22 MED ORDER — STROKE: EARLY STAGES OF RECOVERY BOOK
Freq: Once | Status: AC
  Filled 2022-08-22: qty 1

## 2022-08-22 MED ORDER — GUAIFENESIN ER 600 MG PO TB12
600.0000 mg | ORAL_TABLET | Freq: Two times a day (BID) | ORAL | Status: DC
  Administered 2022-08-22 – 2022-08-27 (×11): 600 mg via ORAL
  Filled 2022-08-22 (×11): qty 1

## 2022-08-22 MED ORDER — LACTATED RINGERS IV SOLN: INTRAVENOUS | Status: DC

## 2022-08-22 MED ORDER — IOHEXOL 350 MG/ML SOLN
80.0000 mL | Freq: Once | INTRAVENOUS | Status: AC | PRN
  Administered 2022-08-22: 80 mL via INTRAVENOUS

## 2022-08-22 NOTE — TOC Progression Note (Signed)
Transition of Care [facility]) - Progression Note    Patient Details  Name: Jenny Acosta MRN: [id number] Date of Birth: [birth year]  [facility] of [facility]) CM/SW Contact  Adrian Prows, RN Phone Number: 08/22/2022, 3:03 PM  Clinical Narrative:    Venetia Constable at [facility] for DME request; he says wheelchair w/ cushioned back and seat will be delivered to room; agency contact info placed in follow-up provider section of d/c instructions.     Barriers to Discharge: No Barriers Identified  Expected Discharge Plan and Services                                               Social Determinants of Health (SDOH) Interventions SDOH Screenings   Food Insecurity: No Food Insecurity (08/22/2022)  Housing: Low Risk  (08/22/2022)  Transportation Needs: No Transportation Needs (08/22/2022)  Utilities: Not At Risk (08/22/2022)  Alcohol Screen: Low Risk  (05/27/2021)  Depression (PHQ2-9): High Risk (06/17/2022)  Financial Resource Strain: Low Risk  (05/27/2021)  Physical Activity: Inactive (05/27/2021)  Social Connections: Unknown (05/27/2021)  Stress: No Stress Concern Present (05/27/2021)  Tobacco Use: Low Risk  (08/21/2022)    Readmission Risk Interventions     No data to display

## 2022-08-22 NOTE — Consult Note (Cosign Needed)
Neurology Consultation  Reason for Consult: Stroke on MRI brain  Referring Physician: Dr. Sunnie Nielsen   CC: weakness, dizziness and fall   History is obtained from:patient and medical record   HPI: Jenny Acosta is a 77 y.o. female with past medical history of carotid stenosis, HTN, HLD, GERD, hypothyroidism, who presented to [facility] ED from her facility for weakness, dizziness and sustaining a fall. She states she had some lightheadedness and her legs gave out. She states she walks with a rollator. She did not hit her head or LOC, but she was unable to get up on her own. MRI brain revealed acute infarct in Right PCA and cerebellum. 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 Neurology consulted for stroke workup   LKW: 6/27 IV thrombolysis given?: no, outside window  EVT:  No LVO Premorbid modified Rankin scale (mRS):  3-Moderate disability-requires help but walks WITHOUT assistance  ROS: Full ROS was performed and is negative except as noted in the HPI.    Past Medical History:  Diagnosis Date   Allergic rhinitis, cause unspecified    Arthritis    Carotid stenosis    Family history of colon cancer 07/21/2013   mother   Gallstone    GERD (gastroesophageal reflux disease)    Hyperlipidemia 08/30/2010   Hypertension    Hypothyroidism    Impaired glucose tolerance    Pain, coccyx    Right lumbar radiculopathy 08/30/2010     Family History  Problem Relation Age of Onset   Colon cancer Mother    Stroke Mother    Arthritis Sister    Atrial fibrillation Sister        has pacemaker   Diabetes Maternal Grandmother    Diabetes Daughter    Stomach cancer Neg Hx      Social History:   reports that she has never smoked. She has never used smokeless tobacco. She reports that she does not drink alcohol and does not use drugs.  Medications  Current Facility-Administered Medications:    [START ON  08/23/2022]  stroke: early stages of recovery book, , Does not apply, Once, Regalado, Belkys A, MD   acetaminophen (TYLENOL) tablet 650 mg, 650 mg, Oral, Q6H PRN, 650 mg at 08/21/22 2139 **OR** acetaminophen (TYLENOL) suppository 650 mg, 650 mg, Rectal, Q6H PRN, Howerter, Justin B, DO   aspirin suppository 300 mg, 300 mg, Rectal, Daily **OR** aspirin tablet 325 mg, 325 mg, Oral, Daily, Regalado, Belkys A, MD   azithromycin (ZITHROMAX) 500 mg in sodium chloride 0.9 % 250 mL IVPB, 500 mg, Intravenous, Q24H, Howerter, Justin B, DO, Stopped at 08/21/22 2333   benzonatate (TESSALON) capsule 200 mg, 200 mg, Oral, TID PRN, Howerter, Justin B, DO   butalbital-acetaminophen-caffeine (FIORICET) 50-325-40 MG per tablet 1 tablet, 1 tablet, Oral, Q6H PRN, Anthoney Harada, NP, 1 tablet at 08/22/22 0144   cefTRIAXone (ROCEPHIN) 2 g in sodium chloride 0.9 % 100 mL IVPB, 2 g, Intravenous, Q24H, Howerter, Justin B, DO   levothyroxine (SYNTHROID) tablet 100 mcg, 100 mcg, Oral, Q0600, Howerter, Justin B, DO, 100 mcg at 08/22/22 0732   meclizine (ANTIVERT) tablet 25 mg, 25 mg, Oral, TID PRN, Howerter, Justin B, DO   melatonin tablet 3 mg, 3 mg, Oral, QHS PRN, Howerter, Justin B, DO   ondansetron (ZOFRAN) injection 4 mg, 4 mg, Intravenous, Q6H PRN, Howerter, Justin B, DO, 4 mg at 08/22/22 0624   pantoprazole (PROTONIX) EC tablet 40 mg, 40  mg, Oral, Daily, Howerter, Justin B, DO   PARoxetine (PAXIL) tablet 30 mg, 30 mg, Oral, Daily, Howerter, Justin B, DO   polyethylene glycol (MIRALAX / GLYCOLAX) packet 17 g, 17 g, Oral, Daily, Regalado, Belkys A, MD   rosuvastatin (CRESTOR) tablet 40 mg, 40 mg, Oral, Daily, Howerter, Justin B, DO   Exam: Current vital signs: BP 133/81 (BP Location: Right Arm)   Pulse 81   Temp 97.9 F (36.6 C) (Oral)   Resp 18   Ht 5\' 5"  (1.651 m)   Wt 99.8 kg   SpO2 95%   BMI 36.61 kg/m  Vital signs in last 24 hours: Temp:  [97.9 F (36.6 C)-98.6 F (37 C)] 97.9 F (36.6 C) (06/28  0737) Pulse Rate:  [81-101] 81 (06/28 0737) Resp:  [18-21] 18 (06/28 0737) BP: (133-171)/(57-112) 133/81 (06/28 0737) SpO2:  [93 %-99 %] 95 % (06/28 0737) Weight:  [99.8 kg-110.7 kg] 99.8 kg (06/27 1300)  GENERAL: Awake, alert in NAD HEENT: - Normocephalic and atraumatic, dry mm, missing multiple teeth  LUNGS - Clear to auscultation bilaterally with no wheezes CV - S1S2 RRR, no m/r/g, equal pulses bilaterally. ABDOMEN - Soft, nontender, nondistended with normoactive BS Ext: warm, well perfused, intact peripheral pulses, no edema  NEURO:  Mental Status: AA&Ox3  Language: speech is clear.  Naming, repetition, fluency, and comprehension intact. Cranial Nerves: PERRL EOMI- nystagmus on rightward gaze, visual fields with left inferior field cut, no facial asymmetry, facial sensation intact, hearing intact, tongue/uvula/soft palate midline, normal sternocleidomastoid and trapezius muscle strength. No evidence of tongue atrophy or fibrillations Motor: bilateral upper essential tremor, 5/5, bilateral lowers 4/5 Tone: is normal and bulk is normal Sensation- Intact to light touch bilaterally Coordination: FTN intact bilaterally, no ataxia in BLE. Gait- deferred  NIHSS 1a Level of Conscious.: 0 1b LOC Questions: 0 1c LOC Commands: 0 2 Best Gaze: 0 3 Visual: 1 4 Facial Palsy: 0 5a Motor Arm - left: 0 5b Motor Arm - Right: 0 6a Motor Leg - Left: 0 6b Motor Leg - Right: 0 7 Limb Ataxia: 0 8 Sensory: 0 9 Best Language: 0 10 Dysarthria: 0 11 Extinct. and Inatten.: 0 TOTAL: 1   Labs I have reviewed labs in epic and the results pertinent to this consultation are:  CBC    Component Value Date/Time   WBC 9.8 08/22/2022 0113   RBC 5.37 (H) 08/22/2022 0113   HGB 15.2 (H) 08/22/2022 0113   HGB 14.8 12/03/2013 2155   HCT 47.5 (H) 08/22/2022 0113   HCT 46.7 12/03/2013 2155   PLT 253 08/22/2022 0113   PLT 275 12/03/2013 2155   MCV 88.5 08/22/2022 0113   MCV 89 12/03/2013 2155   MCH  28.3 08/22/2022 0113   MCHC 32.0 08/22/2022 0113   RDW 14.3 08/22/2022 0113   RDW 14.1 12/03/2013 2155   LYMPHSABS 3.3 08/22/2022 0113   MONOABS 1.0 08/22/2022 0113   EOSABS 0.0 08/22/2022 0113   BASOSABS 0.0 08/22/2022 0113    CMP     Component Value Date/Time   NA 136 08/22/2022 0113   NA 142 12/03/2013 2155   K 3.7 08/22/2022 0113   K 3.7 12/03/2013 2155   CL 101 08/22/2022 0113   CL 109 (H) 12/03/2013 2155   CO2 23 08/22/2022 0113   CO2 25 12/03/2013 2155   GLUCOSE 110 (H) 08/22/2022 0113   GLUCOSE 106 (H) 12/03/2013 2155   BUN 10 08/22/2022 0113   BUN 10 12/03/2013 2155  CREATININE 0.58 08/22/2022 0113   CREATININE 0.71 12/03/2013 2155   CALCIUM 8.9 08/22/2022 0113   CALCIUM 8.7 12/03/2013 2155   PROT 8.0 08/22/2022 0113   PROT 7.5 12/03/2013 2155   ALBUMIN 4.0 08/22/2022 0113   ALBUMIN 3.6 12/03/2013 2155   AST 29 08/22/2022 0113   AST 19 12/03/2013 2155   ALT 27 08/22/2022 0113   ALT 18 12/03/2013 2155   ALKPHOS 116 08/22/2022 0113   ALKPHOS 110 12/03/2013 2155   BILITOT 0.7 08/22/2022 0113   BILITOT 0.3 12/03/2013 2155   GFRNONAA >60 08/22/2022 0113   GFRNONAA >60 12/03/2013 2155   GFRAA >60 12/03/2013 2155    Lipid Panel     Component Value Date/Time   CHOL 231 (H) 06/17/2022 1537   TRIG (H) 06/17/2022 1537    460.0 Triglyceride is over 400; calculations on Lipids are invalid.   HDL 46.40 06/17/2022 1537   CHOLHDL 5 06/17/2022 1537   VLDL 49.4 (H) 01/11/2019 1645   LDLCALC 193 (H) 07/21/2013 1702   LDLDIRECT 121.0 06/17/2022 1537    A1c 5.8 ( 05/2022)  Imaging I have reviewed the images obtained:  CT-head- no acute process. Chronic infarct in right parietotemporal  MRI examination of the brain- acute infarcts in right cerebellum and right PCA   Assessment:  77 y.o. female with past medical history of carotid stenosis, HTN, HLD, GERD, hypothyroidism, who presented to [facility] ED from her facility for weakness, dizziness and sustaining a fall. MRI  brain with acute strokes    Impression: acute infarcts in right cerebellum and right PCA   Recommendations: - HgbA1c, fasting lipid panel - MRA head and neck -(ordered) - Frequent neuro checks - Echocardiogram - Prophylactic therapy-Antiplatelet med: Aspirin - dose 81mg   - Crestor 40 mg (already ordered)  - Risk factor modification - Telemetry monitoring - PT consult, OT consult, Speech consult - Stroke team to follow  Gevena Mart DNP, ACNPC-AG  [facility]

## 2022-08-22 NOTE — Progress Notes (Signed)
Carotid artery duplex has been completed. Preliminary results can be found in CV Proc through chart review.   08/22/22 10:17 AM Olen Cordial RVT

## 2022-08-22 NOTE — Hospital Course (Signed)
77yo with h/o ascending thoracic aortic aneurysm, HLD, and hypothyroidism who presented on 6/27 with generalized weakness x 3-4 days as well as cough, SOB, vertigo.  Chest CTA with stable 5.3 cm aneurysm, RML opacity. Diagnosed with CAP, treated with Rocephin/Azithromycin.  MRI with acute R cerebellar and R PCA territory infarcts as well as chronic R posterior MCA infarcts.  Transferred from [facility] to [facility] for neurology consult.

## 2022-08-22 NOTE — Evaluation (Signed)
Physical Therapy Evaluation Patient Details Name: Jenny Acosta MRN: [id number] DOB: 10-19-1945 Today's Date: 08/22/2022  History of Present Illness  Pt is 77 yo female admitted on 08/21/22 with CAP, vertigo, and found to have acute cerebellum stroke/R PCA distribution.  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  Pt with hx including but not limited to carotid stenosis, HTN, HLD, GERD, hypothyroidism  Clinical Impression  Pt admitted with above diagnosis. At baseline, pt is independent with rollator and lives at ILF.  Today, pt requiring min A of 2 for safety with transfers and only able to ambulate 5' with RW.  She fatigued easily.  Pt reports some dizziness but reports improving - did cue for focus points and segmental turns.  Noted order for vestibular testing - but with + cerebellar stroke did not further test vestibular at this time. Pt with excellent rehab potential and reports possibility of staying with her daughter for a while.  At d/c Patient will benefit from intensive inpatient follow up therapy, >3 hours/day. Pt currently with functional limitations due to the deficits listed below (see PT Problem List). Pt will benefit from acute skilled PT to increase their independence and safety with mobility to allow discharge.          Recommendations for follow up therapy are one component of a multi-disciplinary discharge planning process, led by the attending physician.  Recommendations may be updated based on patient status, additional functional criteria and insurance authorization.  Follow Up Recommendations       Assistance Recommended at Discharge Frequent or constant Supervision/Assistance  Patient can return home with the following  A little help with walking and/or transfers;A little help with bathing/dressing/bathroom;Assistance with cooking/housework;Help with stairs or ramp for entrance    Equipment Recommendations Rolling walker (2 wheels)  Recommendations for Other  Services  Rehab consult    Functional Status Assessment Patient has had a recent decline in their functional status and demonstrates the ability to make significant improvements in function in a reasonable and predictable amount of time.     Precautions / Restrictions Precautions Precautions: Fall      Mobility  Bed Mobility Overal bed mobility: Needs Assistance Bed Mobility: Supine to Sit     Supine to sit: Min assist     General bed mobility comments: slow transisitons with cues for focus point    Transfers Overall transfer level: Needs assistance Equipment used: Rolling walker (2 wheels) Transfers: Sit to/from Stand Sit to Stand: Min assist, +2 physical assistance           General transfer comment: Min A of 2 for safety; cues for focus point    Ambulation/Gait Ambulation/Gait assistance: Min assist, +2 safety/equipment Gait Distance (Feet): 5 Feet Assistive device: Rolling walker (2 wheels) Gait Pattern/deviations: Step-to pattern, Decreased stride length, Knees buckling, Shuffle Gait velocity: decreased     General Gait Details: Short steps with slow transitions, cues for segmental turns, min A to steady, 1 episode of knees buckling, fatigued easily  Stairs            Wheelchair Mobility    Modified Rankin (Stroke Patients Only) Modified Rankin (Stroke Patients Only) Pre-Morbid Rankin Score: No significant disability Modified Rankin: Moderately severe disability     Balance Overall balance assessment: Needs assistance Sitting-balance support: No upper extremity supported Sitting balance-Leahy Scale: Good     Standing balance support: Bilateral upper extremity supported Standing balance-Leahy Scale: Poor Standing balance comment: RW and min A  Pertinent Vitals/Pain Pain Assessment Pain Assessment: 0-10 Pain Score: 7  Pain Location: fibromylagia-generalized Pain Descriptors / Indicators:  Discomfort, Aching Pain Intervention(s): Limited activity within patient's tolerance, Monitored during session    Home Living Family/patient expects to be discharged to:: Private residence Living Arrangements: Alone Available Help at Discharge: Family;Available PRN/intermittently (daughter lives in [place] and may be able to stay with her at d/c) Type of Home: Independent living facility Home Access: Level entry       Home Layout: One level Home Equipment: Rollator (4 wheels);Cane - single point;Shower seat - built in;Grab bars - tub/shower Additional Comments: If goes to daughter's she has 4-5 steps to enter.    Prior Function               Mobility Comments: Walks with rollator for community distances ADLs Comments: Only does showers if daughter there for supervision, otherwise sponge bathes.  Pt able to dress and toilet on her own. Does not drive and has groceries delivered.  Pt fixes light meals (ensure, fruit, etc).     Hand Dominance        Extremity/Trunk Assessment   Upper Extremity Assessment Upper Extremity Assessment: RUE deficits/detail;LUE deficits/detail RUE Deficits / Details: ROM WFL; MMT grossly 5/5 LUE Deficits / Details: ROM WFL; MMT grossly 4/5 throughout    Lower Extremity Assessment Lower Extremity Assessment: LLE deficits/detail;RLE deficits/detail RLE Deficits / Details: ROM WFL; MMT 5/5 but reports weak in standing RLE Sensation:  (reports stinging sensation R lower leg since CVA) LLE Deficits / Details: ROM WFL ; MMT 5/5 but reports weak in standing    Cervical / Trunk Assessment Cervical / Trunk Assessment: Normal  Communication   Communication: HOH  Cognition Arousal/Alertness: Awake/alert Behavior During Therapy: Anxious Overall Cognitive Status: Within Functional Limits for tasks assessed                                          General Comments  Reports double vision that is new. Reports vision blurry  baseline.  Pt with coordinated EOEM and no nystagmus noted.      Exercises     Assessment/Plan    PT Assessment Patient needs continued PT services  PT Problem List Decreased strength;Decreased range of motion;Decreased activity tolerance;Decreased balance;Decreased mobility;Decreased knowledge of use of DME;Decreased knowledge of precautions       PT Treatment Interventions DME instruction;Therapeutic exercise;Gait training;Stair training;Functional mobility training;Therapeutic activities;Patient/family education;Balance training;Neuromuscular re-education    PT Goals (Current goals can be found in the Care Plan section)  Acute Rehab PT Goals Patient Stated Goal: improve balance; reports could stay with daughter for a while PT Goal Formulation: With patient Time For Goal Achievement: 09/05/22 Potential to Achieve Goals: Good    Frequency Min 4X/week     Co-evaluation               AM-PAC PT "6 Clicks" Mobility  Outcome Measure Help needed turning from your back to your side while in a flat bed without using bedrails?: A Little Help needed moving from lying on your back to sitting on the side of a flat bed without using bedrails?: A Little Help needed moving to and from a bed to a chair (including a wheelchair)?: A Little Help needed standing up from a chair using your arms (e.g., wheelchair or bedside chair)?: A Little Help needed to walk in hospital room?: Total Help needed  climbing 3-5 steps with a railing? : Total 6 Click Score: 14    End of Session Equipment Utilized During Treatment: Gait belt Activity Tolerance: Patient limited by fatigue Patient left: with chair alarm set;in chair;with call bell/phone within reach Nurse Communication: Mobility status PT Visit Diagnosis: Other abnormalities of gait and mobility (R26.89);Dizziness and giddiness (R42)    Time: 1610-9604 PT Time Calculation (min) (ACUTE ONLY): 35 min   Charges:   PT Evaluation $PT Eval Low  Complexity: 1 Low PT Treatments $Gait Training: 8-22 mins        Anise Salvo, PT Acute Rehab [facility] [phone]   Rayetta Humphrey 08/22/2022, 5:02 PM

## 2022-08-22 NOTE — Plan of Care (Signed)
  Problem: Health Behavior/Discharge Planning: Goal: Ability to manage health-related needs will improve Outcome: Progressing   

## 2022-08-22 NOTE — Plan of Care (Addendum)
Patient transfer ordered approved to go to [facility]. Report given to Francena Hanly Banker). 1700: Care link set up and requested.  1740: Care link arrived to transport patient.  Problem: Education: Goal: Knowledge of General Education information will improve Description: Including pain rating scale, medication(s)/side effects and non-pharmacologic comfort measures Outcome: Progressing   Problem: Health Behavior/Discharge Planning: Goal: Ability to manage health-related needs will improve Outcome: Progressing   Problem: Clinical Measurements: Goal: Ability to maintain clinical measurements within normal limits will improve Outcome: Progressing Goal: Will remain free from infection Outcome: Progressing Goal: Diagnostic test results will improve Outcome: Progressing Goal: Respiratory complications will improve Outcome: Progressing Goal: Cardiovascular complication will be avoided Outcome: Progressing   Problem: Activity: Goal: Risk for activity intolerance will decrease Outcome: Progressing   Problem: Nutrition: Goal: Adequate nutrition will be maintained Outcome: Progressing   Problem: Coping: Goal: Level of anxiety will decrease Outcome: Progressing   Problem: Elimination: Goal: Will not experience complications related to bowel motility Outcome: Progressing Goal: Will not experience complications related to urinary retention Outcome: Progressing   Problem: Pain Managment: Goal: General experience of comfort will improve Outcome: Progressing   Problem: Safety: Goal: Ability to remain free from injury will improve Outcome: Progressing   Problem: Skin Integrity: Goal: Risk for impaired skin integrity will decrease Outcome: Progressing   Problem: Education: Goal: Knowledge of disease or condition will improve Outcome: Progressing Goal: Knowledge of secondary prevention will improve (MUST DOCUMENT ALL) Outcome: Progressing Goal: Knowledge of patient specific risk  factors will improve Loraine Leriche N/A or DELETE if not current risk factor) Outcome: Progressing   Problem: Ischemic Stroke/TIA Tissue Perfusion: Goal: Complications of ischemic stroke/TIA will be minimized Outcome: Progressing   Problem: Coping: Goal: Will verbalize positive feelings about self Outcome: Progressing Goal: Will identify appropriate support needs Outcome: Progressing   Problem: Health Behavior/Discharge Planning: Goal: Ability to manage health-related needs will improve Outcome: Progressing Goal: Goals will be collaboratively established with patient/family Outcome: Progressing   Problem: Self-Care: Goal: Ability to participate in self-care as condition permits will improve Outcome: Progressing Goal: Verbalization of feelings and concerns over difficulty with self-care will improve Outcome: Progressing Goal: Ability to communicate needs accurately will improve Outcome: Progressing   Problem: Nutrition: Goal: Risk of aspiration will decrease Outcome: Progressing Goal: Dietary intake will improve Outcome: Progressing

## 2022-08-22 NOTE — Progress Notes (Signed)
PROGRESS NOTE    Jenny Acosta  ZOX:[id number] DOB: 11-06-1945 DOA: 08/21/2022 PCP: [name], MD   Brief Narrative: 77 year old with past medical history significant for ascending thoracic aortic aneurysm, hyperlipidemia, acquired hypothyroidism now, who presents 6/27 with generalized weakness for the last 3 to 4 days prior to admission, she also report nonproductive cough, mild shortness of breath.  She also report vertigo for 1 day duration, she noticed a prior history of vertigo similar to presentation.  Evaluation in the ED she was noted to be hypertensive, mild tachypnea respiration rate 21, tachycardic heart rate 100s, CTA chest abdomen and pelvis with similar appearance of known ascending thoracic aortic aneurysm measuring up to 5.3 cm, right middle lobe airspace opacity concerning for infiltrates.  MRI; positive for acute infarct in the right cerebellar and and mildly scattered in the right PCA territory.  Chronic posterior right MCA infarcts.   Assessment & Plan:   Principal Problem:   CAP (community acquired pneumonia) Active Problems:   GERD (gastroesophageal reflux disease)   Allergic rhinitis   Acquired hypothyroidism   Hyperlipidemia   Generalized weakness   Vertigo   Fall at home, initial encounter   Thoracic ascending aortic aneurysm (HCC)   Dehydration   Depression   1-Acute cerebellum stroke, and Right PCA distribution:  -Continue with neuro checks -MRI brain positive for stroke right cerebellum and mildly scattered in the right PCA territory. -MR angio head: Motion liver-related study.  Severe stenosis of the mid basilar artery with proximal tapering suspicious for dissection versus thrombus.  MRA reviewed with radiology and neurology plan is to proceed with CT angio head and neck with and without contrast to further evaluate -CTA head and neck: Severe nearly occlusive basilar artery stenosis with appearance that could reflect dissection, although  atherosclerotic disease could have similar appearance.  Moderate right M1 MCA stenosis.  Approximately 50% stenosis of the right ICA in the neck. Continue with Crestor, started on aspirin. Discussed with Dr Amada Jupiter, plan to transfer patient to [facility] for further stroke care, and in case of deterioration, start Plavix. Monitor on Telemetry.    Community-acquired pneumonia: Patient presents with cough, CTA chest showed focal subpleural nodular consolidation in the right middle lobe may represent atelectasis versus scaring developing infiltrate cannot be excluded Continue with IV ceftriaxone and azithromycin Start guaifenesin  Generalized weakness: In the setting of acute illness, stroke, pneumonia   Ground-level fall: PT /OT consult/   Dehydration: -Continue with IV fluids  GERD; continue with Protonix  Hyperlipidemia: Continue with Crestor  Acquired hypothyroidism: Continue with Synthroid  Depression: Continue with paroxetine  Ascending thoracic aortic aneurysm: Continue surveillance         Estimated body mass index is 36.61 kg/m as calculated from the following:   Height as of this encounter: 5\' 5"  (1.651 m).   Weight as of this encounter: 99.8 kg.   DVT prophylaxis: SCD Code Status: Full code Family Communication: Daughter Over phone Disposition Plan:  Status is: Inpatient Remains inpatient appropriate because: management of stroke.     Consultants:  Neurology   Procedures:  ECHO Doppler  Antimicrobials:    Subjective: She is alert, report mild cough. She has problems with sinus .    Objective: Vitals:   08/21/22 2226 08/21/22 2346 08/22/22 0352 08/22/22 0737  BP:  (!) 145/74 (!) 158/57 133/81  Pulse:  86 89 81  Resp:    18  Temp: 98.6 F (37 C) 98.3 F (36.8 C) 98.2 F (36.8 C)  97.9 F (36.6 C)  TempSrc: Oral Oral Oral Oral  SpO2:  97% 93% 95%  Weight:      Height:        Intake/Output Summary (Last 24 hours) at  08/22/2022 0828 Last data filed at 08/22/2022 0651 Gross per 24 hour  Intake 732.7 ml  Output 10 ml  Net 722.7 ml   Filed Weights   08/21/22 1257 08/21/22 1300  Weight: 110.7 kg 99.8 kg    Examination:  General exam: Appears calm and comfortable  Respiratory system: Clear to auscultation. Respiratory effort normal. Cardiovascular system: S1 & S2 heard, RRR. No JVD, murmurs, rubs, gallops or clicks. No pedal edema. Gastrointestinal system: Abdomen is nondistended, soft and nontender. No organomegaly or masses felt. Normal bowel sounds heard. Central nervous system: Alert and oriented. Report blurry vision, speech is clear, finger to nose abnormal. Report dizziness. Balance problems. She was able to moves all 4 extremities no drift.  Extremities: Symmetric 5 x 5 power. Skin: No rashes, lesions or ulcers    Data Reviewed: I have personally reviewed following labs and imaging studies  CBC: Recent Labs  Lab 08/21/22 1314 08/22/22 0113  WBC 9.9 9.8  NEUTROABS 6.8 5.4  HGB 15.1* 15.2*  HCT 47.6* 47.5*  MCV 89.1 88.5  PLT 238 253   Basic Metabolic Panel: Recent Labs  Lab 08/21/22 1314 08/22/22 0113  NA 140 136  K 3.6 3.7  CL 101 101  CO2 26 23  GLUCOSE 125* 110*  BUN 14 10  CREATININE 0.80 0.58  CALCIUM 9.1 8.9  MG 2.3 2.4   GFR: Estimated Creatinine Clearance: 68.9 mL/min (by C-G formula based on SCr of 0.58 mg/dL). Liver Function Tests: Recent Labs  Lab 08/21/22 1314 08/22/22 0113  AST 28 29  ALT 27 27  ALKPHOS 118 116  BILITOT 0.6 0.7  PROT 8.2* 8.0  ALBUMIN 4.1 4.0   No results for input(s): "LIPASE", "AMYLASE" in the last 168 hours. Recent Labs  Lab 08/21/22 1329  AMMONIA 13   Coagulation Profile: No results for input(s): "INR", "PROTIME" in the last 168 hours. Cardiac Enzymes: Recent Labs  Lab 08/22/22 0113  CKTOTAL 82   BNP (last 3 results) No results for input(s): "PROBNP" in the last 8760 hours. HbA1C: No results for input(s):  "HGBA1C" in the last 72 hours. CBG: Recent Labs  Lab 08/21/22 1302  GLUCAP 118*   Lipid Profile: No results for input(s): "CHOL", "HDL", "LDLCALC", "TRIG", "CHOLHDL", "LDLDIRECT" in the last 72 hours. Thyroid Function Tests: Recent Labs    08/22/22 0113  TSH 0.538   Anemia Panel: Recent Labs    08/22/22 0113  VITAMINB12 627   Sepsis Labs: Recent Labs  Lab 08/22/22 0113  PROCALCITON <0.10    Recent Results (from the past 240 hour(s))  Resp panel by RT-PCR (RSV, Flu A&B, Covid) Anterior Nasal Swab     Status: None   Collection Time: 08/21/22  1:19 PM   Specimen: Anterior Nasal Swab  Result Value Ref Range Status   SARS Coronavirus 2 by RT PCR NEGATIVE NEGATIVE Final    Comment: (NOTE) SARS-CoV-2 target nucleic acids are NOT DETECTED.  The SARS-CoV-2 RNA is generally detectable in upper respiratory specimens during the acute phase of infection. The lowest concentration of SARS-CoV-2 viral copies this assay can detect is 138 copies/mL. A negative result does not preclude SARS-Cov-2 infection and should not be used as the sole basis for treatment or other patient management decisions. A negative result may occur  with  improper specimen collection/handling, submission of specimen other than nasopharyngeal swab, presence of viral mutation(s) within the areas targeted by this assay, and inadequate number of viral copies(<138 copies/mL). A negative result must be combined with clinical observations, patient history, and epidemiological information. The expected result is Negative.  Fact Sheet for Patients:  BloggerCourse.com  Fact Sheet for Healthcare Providers:  SeriousBroker.it  This test is no t yet approved or cleared by the [place] FDA and  has been authorized for detection and/or diagnosis of SARS-CoV-2 by FDA under an Emergency Use Authorization (EUA). This EUA will remain  in effect (meaning this test can  be used) for the duration of the COVID-19 declaration under Section 564(b)(1) of the Act, 21 U.S.C.section 360bbb-3(b)(1), unless the authorization is terminated  or revoked sooner.       Influenza A by PCR NEGATIVE NEGATIVE Final   Influenza B by PCR NEGATIVE NEGATIVE Final    Comment: (NOTE) The Xpert Xpress SARS-CoV-2/FLU/RSV plus assay is intended as an aid in the diagnosis of influenza from Nasopharyngeal swab specimens and should not be used as a sole basis for treatment. Nasal washings and aspirates are unacceptable for Xpert Xpress SARS-CoV-2/FLU/RSV testing.  Fact Sheet for Patients: BloggerCourse.com  Fact Sheet for Healthcare Providers: SeriousBroker.it  This test is not yet approved or cleared by the [place] FDA and has been authorized for detection and/or diagnosis of SARS-CoV-2 by FDA under an Emergency Use Authorization (EUA). This EUA will remain in effect (meaning this test can be used) for the duration of the COVID-19 declaration under Section 564(b)(1) of the Act, 21 U.S.C. section 360bbb-3(b)(1), unless the authorization is terminated or revoked.     Resp Syncytial Virus by PCR NEGATIVE NEGATIVE Final    Comment: (NOTE) Fact Sheet for Patients: BloggerCourse.com  Fact Sheet for Healthcare Providers: SeriousBroker.it  This test is not yet approved or cleared by the [place] FDA and has been authorized for detection and/or diagnosis of SARS-CoV-2 by FDA under an Emergency Use Authorization (EUA). This EUA will remain in effect (meaning this test can be used) for the duration of the COVID-19 declaration under Section 564(b)(1) of the Act, 21 U.S.C. section 360bbb-3(b)(1), unless the authorization is terminated or revoked.  Performed at [facility] [place], [address] [place], [place] [address]          Radiology  Studies: MR BRAIN WO CONTRAST  Result Date: 08/22/2022 CLINICAL DATA:  77 year old female with syncope. Altered mental status. Weakness and dizziness. Vomiting. EXAM: MRI HEAD WITHOUT CONTRAST TECHNIQUE: Multiplanar, multiecho pulse sequences of the brain and surrounding structures were obtained without intravenous contrast. COMPARISON:  CT head and cervical spine yesterday. Previous brain MRI 08/23/2013. FINDINGS: Brain: Patchy, confluent restricted diffusion in the right cerebellum SCA territory. Small areas of acute ischemia involving the midline cerebellar vermis (series 11, image 11). And small patchy areas of acute infarct also in the right occipital pole, cortex along the right parieto-occipital sulcus. Right thalamus is spared. No brainstem restricted diffusion. No anterior circulation restricted diffusion. Associated cytotoxic edema most pronounced in the right cerebellum SCA territory. No hemorrhagic transformation. No mass effect. Chronic encephalomalacia in the posterior right MCA territory is new since 2015 with mild associated hemosiderin. Widespread patchy bilateral cerebral white matter T2 and FLAIR hyperintensity has progressed since the previous MRI also. Chronic lacunar infarct of the left thalamus is new since 2015. Comparatively mild basal ganglia and pontine T2 heterogeneity. No midline shift, mass effect, evidence of mass  lesion, ventriculomegaly, extra-axial collection or acute intracranial hemorrhage. Cervicomedullary junction and pituitary are within normal limits. Vascular: Major intracranial vascular flow voids are stable since 2015. Chronic intracranial artery tortuosity, including vertebrobasilar dolichoectasia. Skull and upper cervical spine: C3-C4 acquired or congenital ankylosis. Otherwise negative for age visible cervical spine. Visualized bone marrow signal is within normal limits. Sinuses/Orbits: Postoperative changes to both globes since 2015. Paranasal Visualized paranasal  sinuses and mastoids are stable and well aerated. Other: Visible internal auditory structures appear normal. Negative visible scalp and face. IMPRESSION: 1. Positive for acute infarcts in the Right cerebellum (right SCA territory and vermis), and mildly scattered in the right PCA territory. No hemorrhagic transformation or mass effect. 2. Chronic posterior right MCA infarct is new since the 2015 brain MRI, and moderately progressed other chronic small vessel disease since that time. Electronically Signed   By: Odessa Fleming M.D.   On: 08/22/2022 06:54   CT Angio Chest/Abd/Pel for Dissection W and/or Wo Contrast  Result Date: 08/21/2022 CLINICAL DATA:  Acute aortic syndrome suspected. EXAM: CT ANGIOGRAPHY CHEST, ABDOMEN AND PELVIS TECHNIQUE: Non-contrast CT of the chest was initially obtained. Multidetector CT imaging through the chest, abdomen and pelvis was performed using the standard protocol during bolus administration of intravenous contrast. Multiplanar reconstructed images and MIPs were obtained and reviewed to evaluate the vascular anatomy. RADIATION DOSE REDUCTION: This exam was performed according to the departmental dose-optimization program which includes automated exposure control, adjustment of the mA and/or kV according to patient size and/or use of iterative reconstruction technique. CONTRAST:  OMNIPAQUE IOHEXOL 350 MG/ML SOLN COMPARISON:  Chest CT dated 11/06/2017 and CT abdomen pelvis dated 08/03/2010. FINDINGS: Evaluation of this exam is limited due to respiratory motion artifact. CTA CHEST FINDINGS Cardiovascular: There is no cardiomegaly or pericardial effusion. Coronary vascular calcification predominately involving the LAD and RCA. Similar appearance of ascending aortic aneurysm measuring up to 5.3 cm. No aortic dissection. There is moderate atherosclerotic calcification of the thoracic aorta. The origins of the great vessels of the aortic arch appear patent. Evaluation of the pulmonary  arteries is limited due to respiratory motion. No central pulmonary artery embolus identified. Mediastinum/Nodes: No hilar or mediastinal adenopathy. The esophagus is grossly unremarkable. No mediastinal fluid collection. Lungs/Pleura: Faint 2 mm left apical nodule (19/9) similar to prior CT. Mild diffuse interstitial coarsening and areas of air trapping may be related to underlying small vessel versus small airway disease. A 3 cm focal subpleural nodular consolidation in the right middle lobe may represent atelectasis/scarring. Developing infiltrate or an area of infarct are not excluded. Attention on follow-up imaging recommended. There is no pleural effusion or pneumothorax. The central airways are patent. Musculoskeletal: Osteopenia with degenerative changes of the spine. No acute osseous pathology. Review of the MIP images confirms the above findings. CTA ABDOMEN AND PELVIS FINDINGS VASCULAR Aorta: Advanced atherosclerotic calcification of the abdominal aorta. No aneurysmal dilatation or dissection. No periaortic fluid collection. Celiac: The celiac trunk and its major branches are patent. No aneurysmal dilatation or dissection. SMA: The SMA is patent.  No aneurysmal dilatation or dissection. Renals: The renal arteries are patent.  No aneurysm. IMA: The IMA is patent. Inflow: Moderate atherosclerotic calcification of the iliac arteries. No aneurysmal dilatation or dissection. The iliac arteries are patent bilaterally. Veins: No obvious venous abnormality within the limitations of this arterial phase study. Review of the MIP images confirms the above findings. NON-VASCULAR No intra-abdominal free air or free fluid. Hepatobiliary: There is irregularity of the liver contour suggestive  of cirrhosis. Clinical correlation is recommended. No biliary dilatation. Cholecystectomy. Pancreas: Unremarkable. No pancreatic ductal dilatation or surrounding inflammatory changes. Spleen: Normal in size without focal abnormality.  Adrenals/Urinary Tract: The adrenal glands unremarkable. There is no hydronephrosis on either side. Symmetric excretion of contrast by both kidneys. The visualized ureters and urinary bladder appear unremarkable. Stomach/Bowel: There is large amount of stool throughout the colon. No bowel obstruction or active inflammation. The appendix is not visualized with certainty. No inflammatory changes identified in the right lower quadrant. Lymphatic: No adenopathy. Reproductive: The uterus is anteverted and grossly unremarkable. No adnexal masses. Other: None Musculoskeletal: Osteopenia with degenerative changes of the spine. Grade 1 L4-L5 anterolisthesis. L2 hemangioma. No acute osseous pathology. Review of the MIP images confirms the above findings. IMPRESSION: 1. No acute intrathoracic, abdominal, or pelvic pathology. 2. Similar appearance of ascending aortic aneurysm measuring up to 5.3 cm. Ascending thoracic aortic aneurysm. Recommend semi-annual imaging followup by CTA or MRA and referral to cardiothoracic surgery if not already obtained. This recommendation follows 2010 ACCF/AHA/AATS/ACR/ASA/SCA/SCAI/SIR/STS/SVM Guidelines for the Diagnosis and Management of Patients With Thoracic Aortic Disease. Circulation. 2010; 121: Z610-R604. Aortic aneurysm NOS (ICD10-I71.9). No aortic dissection. 3. A 3 cm focal subpleural nodular consolidation in the right middle lobe may represent atelectasis/scarring. Developing infiltrate or an area of infarct are not excluded. Attention on follow-up imaging recommended. 4. Cirrhosis. 5. Constipation. No bowel obstruction. 6.  Aortic Atherosclerosis (ICD10-I70.0). Electronically Signed   By: Elgie Collard M.D.   On: 08/21/2022 18:37   CT Head Wo Contrast  Result Date: 08/21/2022 CLINICAL DATA:  Mental status change, unknown cause; Polytrauma, blunt EXAM: CT HEAD WITHOUT CONTRAST CT CERVICAL SPINE WITHOUT CONTRAST TECHNIQUE: Multidetector CT imaging of the head and cervical spine  was performed following the standard protocol without intravenous contrast. Multiplanar CT image reconstructions of the cervical spine were also generated. RADIATION DOSE REDUCTION: This exam was performed according to the departmental dose-optimization program which includes automated exposure control, adjustment of the mA and/or kV according to patient size and/or use of iterative reconstruction technique. COMPARISON:  CT Chest 11/06/17 FINDINGS: CT HEAD FINDINGS Brain: Chronic infarct in the right parietal temporal region sequela of moderate microvascular ischemic change. No CT evidence of an acute cortical infarct. No hemorrhage. No hydrocephalus. No extra-axial fluid collection generalized volume loss Vascular: No hyperdense vessel or unexpected calcification. Skull: Normal. Negative for fracture or focal lesion. Sinuses/Orbits: No middle ear or mastoid effusion air-fluid level in the right sphenoid sinus orbits are notable for bilateral lens replacement, otherwise unremarkable. Other: None. CT CERVICAL SPINE FINDINGS Alignment: Straightening of the normal cervical lordosis. Skull base and vertebrae: No acute cervical spine fracture. No primary bone lesion or focal pathologic process. Likely an osseous hemangioma at T2. possible acute fracture of the left T2 transverse process (series 4, image 26) Soft tissues and spinal canal: No prevertebral fluid or swelling. No visible canal hematoma. Disc levels:  No evidence of high-grade spinal canal stenosis Upper chest: Biapical ground-glass opacities are favored to represent infectious or inflammatory process. Other: None IMPRESSION: 1. No acute intracranial abnormality. Chronic infarct in the right parietotemporal region. 2. No acute cervical spine fracture. Possible acute fracture of the left T2 transverse process. 3. Biapical ground-glass opacities are favored to represent infectious or inflammatory process. Electronically Signed   By: Lorenza Cambridge M.D.   On:  08/21/2022 14:23   CT Cervical Spine Wo Contrast  Result Date: 08/21/2022 CLINICAL DATA:  Mental status change, unknown cause; Polytrauma, blunt EXAM: CT  HEAD WITHOUT CONTRAST CT CERVICAL SPINE WITHOUT CONTRAST TECHNIQUE: Multidetector CT imaging of the head and cervical spine was performed following the standard protocol without intravenous contrast. Multiplanar CT image reconstructions of the cervical spine were also generated. RADIATION DOSE REDUCTION: This exam was performed according to the departmental dose-optimization program which includes automated exposure control, adjustment of the mA and/or kV according to patient size and/or use of iterative reconstruction technique. COMPARISON:  CT Chest 11/06/17 FINDINGS: CT HEAD FINDINGS Brain: Chronic infarct in the right parietal temporal region sequela of moderate microvascular ischemic change. No CT evidence of an acute cortical infarct. No hemorrhage. No hydrocephalus. No extra-axial fluid collection generalized volume loss Vascular: No hyperdense vessel or unexpected calcification. Skull: Normal. Negative for fracture or focal lesion. Sinuses/Orbits: No middle ear or mastoid effusion air-fluid level in the right sphenoid sinus orbits are notable for bilateral lens replacement, otherwise unremarkable. Other: None. CT CERVICAL SPINE FINDINGS Alignment: Straightening of the normal cervical lordosis. Skull base and vertebrae: No acute cervical spine fracture. No primary bone lesion or focal pathologic process. Likely an osseous hemangioma at T2. possible acute fracture of the left T2 transverse process (series 4, image 26) Soft tissues and spinal canal: No prevertebral fluid or swelling. No visible canal hematoma. Disc levels:  No evidence of high-grade spinal canal stenosis Upper chest: Biapical ground-glass opacities are favored to represent infectious or inflammatory process. Other: None IMPRESSION: 1. No acute intracranial abnormality. Chronic infarct in  the right parietotemporal region. 2. No acute cervical spine fracture. Possible acute fracture of the left T2 transverse process. 3. Biapical ground-glass opacities are favored to represent infectious or inflammatory process. Electronically Signed   By: Lorenza Cambridge M.D.   On: 08/21/2022 14:23   DG Chest Portable 1 View  Result Date: 08/21/2022 CLINICAL DATA:  Weakness.  Dizziness.  Known aneurysm EXAM: PORTABLE CHEST 1 VIEW COMPARISON:  X-ray 10/27/2017. FINDINGS: Increasing widening of the mediastinum. Borderline cardiopericardial silhouette. No consolidation, pneumothorax or effusion. No edema. Films are under penetrated. Overlapping cardiac leads. IMPRESSION: Increasing widening of the mediastinum. With patient's history recommend further workup with chest CT when clinically appropriate. Electronically Signed   By: Karen Kays M.D.   On: 08/21/2022 14:02        Scheduled Meds:  levothyroxine  100 mcg Oral Q0600   pantoprazole  40 mg Oral Daily   PARoxetine  30 mg Oral Daily   rosuvastatin  40 mg Oral Daily   Continuous Infusions:  azithromycin Stopped (08/21/22 2333)   cefTRIAXone (ROCEPHIN)  IV       LOS: 1 day    Time spent: 35 minutes     Vallory Oetken A Norene Oliveri, MD [facility]   If 7PM-7AM, please contact night-coverage [email or web address]  08/22/2022, 8:28 AM

## 2022-08-23 ENCOUNTER — Inpatient Hospital Stay (HOSPITAL_COMMUNITY): Payer: 59

## 2022-08-23 ENCOUNTER — Other Ambulatory Visit (HOSPITAL_COMMUNITY): Payer: 59

## 2022-08-23 DIAGNOSIS — I5030 Unspecified diastolic (congestive) heart failure: Secondary | ICD-10-CM | POA: Diagnosis not present

## 2022-08-23 DIAGNOSIS — I6322 Cerebral infarction due to unspecified occlusion or stenosis of basilar arteries: Principal | ICD-10-CM

## 2022-08-23 DIAGNOSIS — E039 Hypothyroidism, unspecified: Secondary | ICD-10-CM | POA: Diagnosis not present

## 2022-08-23 DIAGNOSIS — J189 Pneumonia, unspecified organism: Secondary | ICD-10-CM | POA: Diagnosis not present

## 2022-08-23 DIAGNOSIS — F32A Depression, unspecified: Secondary | ICD-10-CM

## 2022-08-23 DIAGNOSIS — I7121 Aneurysm of the ascending aorta, without rupture: Secondary | ICD-10-CM | POA: Diagnosis not present

## 2022-08-23 LAB — LIPID PANEL
Cholesterol: 258 mg/dL — ABNORMAL HIGH (ref 0–200)
Cholesterol: 131 mg/dL (ref 0–200)
HDL: 61 mg/dL (ref >40)
HDL: 60 mg/dL (ref >40)
LDL Cholesterol: 182 mg/dL — ABNORMAL HIGH (ref 0–99)
LDL Cholesterol: 66 mg/dL (ref 0–99)
Total CHOL/HDL Ratio: 4.2 RATIO
Total CHOL/HDL Ratio: 2.2 RATIO
Triglycerides: 76 mg/dL (ref <150)
Triglycerides: 24 mg/dL (ref <150)
VLDL: 15 mg/dL (ref 0–40)
VLDL: 5 mg/dL (ref 0–40)

## 2022-08-23 LAB — ECHOCARDIOGRAM COMPLETE
Area-P 1/2: 2.05 cm2
Height: 65 in
S' Lateral: 2.7 cm
Weight: 3520 [oz_av]

## 2022-08-23 MED ORDER — CLOPIDOGREL BISULFATE 75 MG PO TABS
225.0000 mg | ORAL_TABLET | Freq: Once | ORAL | Status: AC
  Administered 2022-08-23: 225 mg via ORAL
  Filled 2022-08-23: qty 3

## 2022-08-23 MED ORDER — LORATADINE 10 MG PO TABS
10.0000 mg | ORAL_TABLET | Freq: Once | ORAL | Status: AC
  Administered 2022-08-23: 10 mg via ORAL
  Filled 2022-08-23: qty 1

## 2022-08-23 MED ORDER — AZITHROMYCIN 500 MG PO TABS
500.0000 mg | ORAL_TABLET | Freq: Every day | ORAL | Status: DC
  Administered 2022-08-23: 500 mg via ORAL
  Filled 2022-08-23: qty 1

## 2022-08-23 MED ORDER — EZETIMIBE 10 MG PO TABS
10.0000 mg | ORAL_TABLET | Freq: Every day | ORAL | Status: DC
  Administered 2022-08-23 – 2022-08-27 (×5): 10 mg via ORAL
  Filled 2022-08-23 (×5): qty 1

## 2022-08-23 NOTE — Evaluation (Signed)
Speech Language Pathology Evaluation Patient Details Name: Jenny Acosta MRN: [id number] DOB: 02-16-1946 Today's Date: 08/23/2022 Time: 1420-1440 SLP Time Calculation (min) (ACUTE ONLY): 20 min  Problem List:  Patient Active Problem List   Diagnosis Date Noted   CAP (community acquired pneumonia) 08/21/2022   Dehydration 08/21/2022   Depression 08/21/2022   Hoarseness 06/19/2022   Chronic pain 06/17/2022   Bilateral knee pain 06/17/2022   Vitamin D deficiency 05/27/2021   Gingivitis 05/27/2021   Hypersomnolence 05/27/2021   Bilateral hearing loss 05/27/2021   Thoracic ascending aortic aneurysm (HCC) 10/21/2020   Bilateral sciatica 02/27/2020   Dyspnea 01/11/2019   Cough 10/27/2017   Acute sinus infection 08/07/2015   Eustachian tube dysfunction 08/07/2015   Bilateral arm weakness 08/07/2015   Left otitis media 03/27/2015   Insomnia 10/24/2014   External otitis of right ear 12/18/2013   Right-sided Bell's palsy 12/18/2013   Fibromyalgia 07/21/2013   Headache 07/21/2013   Family history of colon cancer 07/21/2013   Sacroiliac dysfunction 03/15/2013   Subacromial bursitis 02/09/2013   Polyarthralgia 01/18/2013   Bilateral shoulder pain 01/18/2013   Bilateral hand pain 01/18/2013   Fall at home, initial encounter 07/27/2011   Vertigo 07/24/2011   Back pain 02/27/2011   Ocular migraine 08/30/2010   Anxiety 08/30/2010   Right lumbar radiculopathy 08/30/2010   Hyperlipidemia 08/30/2010   Encounter for well adult exam with abnormal findings 08/30/2010   Generalized weakness 08/30/2010   Impaired glucose tolerance    Carotid stenosis    Hypertension    GERD (gastroesophageal reflux disease)    Allergic rhinitis    Acquired hypothyroidism    Past Medical History:  Past Medical History:  Diagnosis Date   Allergic rhinitis, cause unspecified    Arthritis    Carotid stenosis    Family history of colon cancer 07/21/2013   mother   Gallstone    GERD (gastroesophageal  reflux disease)    Hyperlipidemia 08/30/2010   Hypertension    Hypothyroidism    Impaired glucose tolerance    Pain, coccyx    Right lumbar radiculopathy 08/30/2010   Past Surgical History:  Past Surgical History:  Procedure Laterality Date   APPENDECTOMY  1953   CHOLECYSTECTOMY  2002   CHOLECYSTECTOMY OPEN  10 years ago   scarlet fever     age 63   TONSILLECTOMY  71   HPI:  Patient is a 77 y.o. female with PMH: HLD, hypothyroidism, GERD, HTN. She presented to [facility] on 08/21/22 with CAP after presenting from her IL[facility] c/o generalized weakness. In ED, patient afebrile, SpO2 91-94% on RA. CT head negative for acute intracranial abnormality. MRI brain was positive for acute infarcts in right cerebellum and mildly scattered in right PCA territory.   Assessment / Plan / Recommendation Clinical Impression  Patient is not currently presenting with cognitive, language or speech impairments as per this evaluation. She demonstrates good awareness, memory/recall, problem solving and attention. She told SLP that she has been experiencing instances of word finding errors "for specific words" but they come to her after some delay. She endorsed having "fibro fog" from having fibromyalgia. Per patient report, daughter who was here yesterday felt that paie was "talking goofy." SLP did not observe any word finding errors in conversation and did not observe any significant errors in naming. SLP is not recommending further intervention at this time, but did recommend that patient consult with her PCP if she feels she continues to have word finding difficulties.    SLP Assessment  SLP Recommendation/Assessment: Patient does not need any further Speech Lanaguage Pathology Services SLP Visit Diagnosis: Cognitive communication deficit (R41.841)    Recommendations for follow up therapy are one component of a multi-disciplinary discharge planning process, led by the attending physician.  Recommendations may be updated  based on patient status, additional functional criteria and insurance authorization.    Follow Up Recommendations  No SLP follow up    Assistance Recommended at Discharge  None  Functional Status Assessment Patient has not had a recent decline in their functional status  Frequency and Duration           SLP Evaluation Cognition  Overall Cognitive Status: Within Functional Limits for tasks assessed Arousal/Alertness: Awake/alert Orientation Level: Oriented X4 Memory: Appears intact Awareness: Appears intact Problem Solving: Appears intact Safety/Judgment: Appears intact       Comprehension  Auditory Comprehension Overall Auditory Comprehension: Appears within functional limits for tasks assessed    Expression Expression Primary Mode of Expression: Verbal Verbal Expression Overall Verbal Expression: Appears within functional limits for tasks assessed Initiation: No impairment Repetition: No impairment Naming: No impairment Pragmatics: No impairment Interfering Components: Attention Non-Verbal Means of Communication: Not applicable Written Expression Dominant Hand: Right   Oral / Motor  Oral Motor/Sensory Function Overall Oral Motor/Sensory Function: Within functional limits Motor Speech Overall Motor Speech: Appears within functional limits for tasks assessed Respiration: Within functional limits Resonance: Within functional limits Articulation: Within functional limitis Intelligibility: Intelligible Motor Planning: Witnin functional limits Motor Speech Errors: Not applicable            Angela Nevin, MA, CCC-SLP Speech Therapy

## 2022-08-23 NOTE — TOC Progression Note (Signed)
Transition of Care [facility]) - Progression Note    Patient Details  Name: Jenny Acosta MRN: [id number] Date of Birth: 01-Feb-1946  Transition of [facility]) CM/SW Contact  Adrian Prows, RN Phone Number: 08/23/2022, 1:05 PM  Clinical Narrative:    Pt transferred to 5W19 at [facility]; Jermaine at [facility] notified; agency will pick up wheel chair and deliver it to pt's room.     Barriers to Discharge: No Barriers Identified  Expected Discharge Plan and Services                         DME Arranged: Wheelchair manual (wheelchair w/ cushioned back and seat) DME Agency: [employer] Date DME Agency Contacted: 08/22/22 Time DME Agency Contacted: 1505 Representative spoke with at DME Agency: Vaughan Basta             Social Determinants of Health (SDOH) Interventions SDOH Screenings   Food Insecurity: No Food Insecurity (08/22/2022)  Housing: Low Risk  (08/22/2022)  Transportation Needs: No Transportation Needs (08/22/2022)  Utilities: Not At Risk (08/22/2022)  Alcohol Screen: Low Risk  (05/27/2021)  Depression (PHQ2-9): High Risk (06/17/2022)  Financial Resource Strain: Low Risk  (05/27/2021)  Physical Activity: Inactive (05/27/2021)  Social Connections: Unknown (05/27/2021)  Stress: No Stress Concern Present (05/27/2021)  Tobacco Use: Low Risk  (08/21/2022)    Readmission Risk Interventions     No data to display

## 2022-08-23 NOTE — Progress Notes (Addendum)
STROKE TEAM PROGRESS NOTE   BRIEF HPI [name] is a 77 y.o. female with history of carotid stenosis, HTN, HLD, GERD, hypothyroidism, who presented to [facility] ED from her facility for weakness, dizziness and sustaining a fall.  presenting 6/27 after falling. She reports double vision, dizziness.  She may additionally have some aspiration pneumonia which is being managed by the primary team.   SIGNIFICANT HOSPITAL EVENTS 6/27- admitted  6/29-- loaded with plavix 6/28-MRI right superior cerebellar artery infarct.  Small scattered right PCA infarcts 6/28-CT angiogram severe near occlusive basilar artery stenosis.  Moderate right M1 stenosis.  50% proximal right ICA stenosis in the neck.   INTERM HISTORY/SUBJECTIVE Start DAPT therapy.  Will have her fill out the [facility] form so she can pursue that outpatient as patient cannot tolerate statins..  She continues to complain of dizziness and therapy rehab OBJECTIVE  CBC    Component Value Date/Time   WBC 9.8 08/22/2022 0113   RBC 5.37 (H) 08/22/2022 0113   HGB 15.2 (H) 08/22/2022 0113   HGB 14.8 12/03/2013 2155   HCT 47.5 (H) 08/22/2022 0113   HCT 46.7 12/03/2013 2155   PLT 253 08/22/2022 0113   PLT 275 12/03/2013 2155   MCV 88.5 08/22/2022 0113   MCV 89 12/03/2013 2155   MCH 28.3 08/22/2022 0113   MCHC 32.0 08/22/2022 0113   RDW 14.3 08/22/2022 0113   RDW 14.1 12/03/2013 2155   LYMPHSABS 3.3 08/22/2022 0113   MONOABS 1.0 08/22/2022 0113   EOSABS 0.0 08/22/2022 0113   BASOSABS 0.0 08/22/2022 0113    BMET    Component Value Date/Time   NA 136 08/22/2022 0113   NA 142 12/03/2013 2155   K 3.7 08/22/2022 0113   K 3.7 12/03/2013 2155   CL 101 08/22/2022 0113   CL 109 (H) 12/03/2013 2155   CO2 23 08/22/2022 0113   CO2 25 12/03/2013 2155   GLUCOSE 110 (H) 08/22/2022 0113   GLUCOSE 106 (H) 12/03/2013 2155   BUN 10 08/22/2022 0113   BUN 10 12/03/2013 2155   CREATININE 0.58 08/22/2022 0113   CREATININE 0.71 12/03/2013 2155    CALCIUM 8.9 08/22/2022 0113   CALCIUM 8.7 12/03/2013 2155   GFRNONAA >60 08/22/2022 0113   GFRNONAA >60 12/03/2013 2155    IMAGING past 24 hours VAS US CAROTID (at [facility] and [facility] only)  Result Date: 08/22/2022 Carotid Arterial Duplex Study Patient Name:  Jenny Acosta  Date of Exam:   08/22/2022 Medical Rec #: [id number]       Accession #:    [id number] Date of Birth: 02-19-1946       Patient Gender: F Patient Age:   86 years Exam Location:  Jenny Acosta Procedure:      VAS US CAROTID Referring Phys: Hartley Barefoot --------------------------------------------------------------------------------  Indications:       CVA. Risk Factors:      Hyperlipidemia. Limitations        Today's exam was limited due to the body habitus of the                    patient and the patient's respiratory variation. Comparison Study:  No prior studies. Performing Technologist: Chanda Busing RVT  Examination Guidelines: A complete evaluation includes B-mode imaging, spectral Doppler, color Doppler, and power Doppler as needed of all accessible portions of each vessel. Bilateral testing is considered an integral part of a complete examination. Limited examinations for reoccurring indications may be performed as noted.  Right Carotid Findings: +----------+--------+--------+--------+-----------------------+--------+           PSV cm/sEDV cm/sStenosisPlaque Description     Comments +----------+--------+--------+--------+-----------------------+--------+ CCA Prox  53      8               smooth and heterogenous         +----------+--------+--------+--------+-----------------------+--------+ CCA Distal61      10              smooth and heterogenous         +----------+--------+--------+--------+-----------------------+--------+ ICA Prox  53      9               smooth and heterogenoustortuous +----------+--------+--------+--------+-----------------------+--------+ ICA Mid   48      10                                      tortuous +----------+--------+--------+--------+-----------------------+--------+ ICA Distal51      12                                     tortuous +----------+--------+--------+--------+-----------------------+--------+ ECA       76      10                                              +----------+--------+--------+--------+-----------------------+--------+ +----------+--------+-------+--------+-------------------+           PSV cm/sEDV cmsDescribeArm Pressure (mmHG) +----------+--------+-------+--------+-------------------+ [id number]                                        +----------+--------+-------+--------+-------------------+ +---------+--------+--+--------+-+---------+ VertebralPSV cm/s32EDV cm/s7Antegrade +---------+--------+--+--------+-+---------+  Left Carotid Findings: +----------+--------+--------+--------+-----------------------+--------+           PSV cm/sEDV cm/sStenosisPlaque Description     Comments +----------+--------+--------+--------+-----------------------+--------+ CCA Prox  73      16              smooth and heterogenous         +----------+--------+--------+--------+-----------------------+--------+ CCA Distal61      12              smooth and heterogenous         +----------+--------+--------+--------+-----------------------+--------+ ICA Prox  66      20              smooth and heterogenoustortuous +----------+--------+--------+--------+-----------------------+--------+ ICA Mid   56      15                                     tortuous +----------+--------+--------+--------+-----------------------+--------+ ICA Distal48      15                                     tortuous +----------+--------+--------+--------+-----------------------+--------+ ECA       204     20                                              +----------+--------+--------+--------+-----------------------+--------+  +----------+--------+--------+--------+-------------------+  PSV cm/sEDV cm/sDescribeArm Pressure (mmHG) +----------+--------+--------+--------+-------------------+ Subclavian263                                         +----------+--------+--------+--------+-------------------+ +---------+--------+--+--------+--+---------+ VertebralPSV cm/s45EDV cm/s10Antegrade +---------+--------+--+--------+--+---------+   Summary: Right Carotid: Velocities in the right ICA are consistent with a 1-39% stenosis. Left Carotid: Velocities in the left ICA are consistent with a 1-39% stenosis. Vertebrals: Bilateral vertebral arteries demonstrate antegrade flow. *See table(s) above for measurements and observations.  Electronically signed by Coral Else MD on 08/22/2022 at 10:46:13 PM.    Final    CT ANGIO HEAD NECK W WO CM  Result Date: 08/22/2022 CLINICAL DATA:  Neuro deficit, acute, stroke suspected EXAM: CT ANGIOGRAPHY HEAD AND NECK WITH AND WITHOUT CONTRAST TECHNIQUE: Multidetector CT imaging of the head and neck was performed using the standard protocol during bolus administration of intravenous contrast. Multiplanar CT image reconstructions and MIPs were obtained to evaluate the vascular anatomy. Carotid stenosis measurements (when applicable) are obtained utilizing NASCET criteria, using the distal internal carotid diameter as the denominator. RADIATION DOSE REDUCTION: This exam was performed according to the departmental dose-optimization program which includes automated exposure control, adjustment of the mA and/or kV according to patient size and/or use of iterative reconstruction technique. CONTRAST:  80mL OMNIPAQUE IOHEXOL 350 MG/ML SOLN COMPARISON:  None Available. FINDINGS: CT HEAD FINDINGS Brain: Known acute infarcts the cerebellum better characterized on same day MRI. No acute hemorrhage or progressive mass effect. Additional remote infarcts and chronic microvascular ischemic disease.  Cerebral atrophy. No hydrocephalus or most loose improved Vascular: See below. Skull: No acute fracture. Sinuses/Orbits: Clear sinuses.  No acute orbital findings. Other: No mastoid effusions. Review of the MIP images confirms the above findings CTA NECK FINDINGS Aortic arch: Partially imaged ascending aortic aneurysm better characterized on recent CTA chest. Great vessel origins are patent without significant stenosis. Right carotid system: Atherosclerosis involving the proximal ICA with approximately 50% stenosis. Left carotid system: Atherosclerosis involving the proximal ICA without greater than 50% stenosis. Vertebral arteries: Left dominant. No evidence of hemodynamically significant stenosis in the neck. Skeleton: No acute abnormality. Other neck: No acute abnormality. Upper chest: Visualized lung apices are clear. Review of the MIP images confirms the above findings CTA HEAD FINDINGS Anterior circulation: Right A1 ACA is hypoplastic bilateral intracranial ICAs, MCAs, and ACAs are patent. Moderate right M1 MCA stenosis. Posterior circulation: Moderate proximal and severe distal right intradural vertebral artery stenosis. Mild left intradural vertebral artery stenosis. Severe (nearly occlusive) medium distance stenosis of the mid basilar artery with suggestion of eccentric low-density and distal tapering. The posterior cerebral arteries are patent Venous sinuses: As permitted by contrast timing, patent. Review of the MIP images confirms the above findings IMPRESSION: 1. Severe (nearly occlusive) basilar artery stenosis with an appearance that could reflect dissection (particularly given significant progression since 2015 MRI), although atherosclerotic disease could have a similar appearance. 2. Moderate right M1 MCA stenosis. 3. Approximately 50% stenosis of the proximal right ICA in the neck. 4. Partially imaged ascending aortic aneurysm better characterized on recent CTA chest. Electronically Signed   By:  Feliberto Harts M.D.   On: 08/22/2022 14:59   MR ANGIO HEAD WO CONTRAST  Addendum Date: 08/22/2022   ADDENDUM REPORT: 08/22/2022 13:22 ADDENDUM: These results were called by telephone at the time of interpretation on 08/22/2022 at 12:25 pm to provider [facility] , who verbally acknowledged these results.  Electronically Signed   By: Orvan Falconer M.D.   On: 08/22/2022 13:22   Result Date: 08/22/2022 CLINICAL DATA:  Neuro deficit, acute, stroke suspected. EXAM: MRA HEAD WITHOUT CONTRAST TECHNIQUE: Angiographic images of the Circle of Willis were acquired using MRA technique without intravenous contrast. COMPARISON:  MRI brain 08/22/2022. FINDINGS: Moderately motion degraded study.  Within this limitation: Anterior circulation: Intracranial ICAs are patent without significant stenosis. The proximal ACAs and MCAs are patent without significant stenosis. Distal branches are symmetric. Posterior circulation: Severe stenosis of the mid basilar artery with proximal tapering, suspicious for dissection versus thrombus. The SCAs and PICAs are patent proximally. The AICAs are not well visualized, possibly secondary to motion artifact. The PCAs are patent proximally without significant stenosis. Distal branches are symmetric. Anatomic variants: Persistent fetal origin of the left PCA with hypoplastic left P1 segment. Other: None. IMPRESSION: Moderately motion degraded study. Within this limitation, severe stenosis of the mid basilar artery with proximal tapering, suspicious for dissection versus thrombus. Contrast-enhanced vessel imaging may be helpful in distinguishing the etiology of this stenosis. Radiology assistant personnel have been notified to put me in telephone contact with the referring physician or the referring physician's clinical representative in order to discuss these findings. Once this communication is established I will issue an addendum to this report for documentation purposes. Electronically  Signed: By: Orvan Falconer M.D. On: 08/22/2022 12:14    Vitals:   08/22/22 1800 08/22/22 2000 08/22/22 2311 08/23/22 0345  BP: (!) 151/85 (!) 112/59 121/76 (!) 147/60  Pulse: 88 90 96 83  Resp: 19 18 18 18   Temp: 98.6 F (37 C) 98.5 F (36.9 C) 98.1 F (36.7 C) 98.3 F (36.8 C)  TempSrc: Oral Axillary Oral Oral  SpO2:    96%  Weight:      Height:         PHYSICAL EXAM General:  Alert, well-nourished, well-developed pleasant elderly Caucasian lady in no acute distress Psych:  Mood and affect appropriate for situation CV: Regular rate and rhythm on monitor Respiratory:  Regular, unlabored respirations on room air GI: Abdomen soft and nontender   NEURO:  Mental Status: AA&Ox3, patient is able to give clear and coherent history Speech/Language: speech is without dysarthria or aphasia.  Naming, repetition, fluency, and comprehension intact.  Cranial Nerves:  II: PERRL. Visual fields full.  III, IV, VI: EOMI. Eyelids elevate symmetrically.  V: Sensation is intact to light touch and symmetrical to face.  VII: Face is symmetrical resting and smiling VIII: hearing intact to voice. IX, X: Palate elevates symmetrically. Phonation is normal.  WU:[id number] shrug 5/5. XII: tongue is midline without fasciculations. Motor: 5/5 strength to all muscle groups tested.  Tone: is normal and bulk is normal Sensation- Intact to light touch bilaterally. Extinction absent to light touch to DSS.   Coordination: FTN intact bilaterally, HKS: no ataxia in BLE.No drift.  Gait- deferred   ASSESSMENT/PLAN  Acute Ischemic Infarct:  Right occipital and cerebellum infarcts Etiology: Severe basilar artery stenosis likely from atherosclerosis.  Doubt dissection Code Stroke CT head No acute abnormality.  CTA head & neck Severe (nearly occlusive) basilar artery stenosis with an appearance that could reflect dissection (particularly given significant progression since 2015 MRI), although atherosclerotic  disease could have a similar appearance. Moderate right M1 MCA stenosis. Approximately 50% stenosis of the proximal right ICA in the neck. MRI   cluster of small acute infarcts in the right occipital lobe and cerebellum  MRA  severe stenosis of the  mid basilar artery with proximal tapering, suspicious for dissection versus thrombus.  Carotid Doppler  1-39% stenosis bilaterally 2D Echo echo ejection 65 to 70%.  Left atrium size normal LDL 182 HgbA1c 5.8 VTE prophylaxis - SCDs No antithrombotic prior to admission, now on aspirin 81 mg daily and clopidogrel 75 mg daily Consider brilinta if she is able to afford Therapy recommendations:  CIR Disposition:  pending   Hypertension Stable Blood Pressure Goal: BP less than 220/110   Hyperlipidemia Home meds: Rosuvastatin - resumed.  Patient has history of statin intolerance LDL 182, goal < 70 Consider Leqvio High intensity statin not indicated  Continue statin at discharge  Other Stroke Risk Factors Obesity, Body mass index is 36.61 kg/m., BMI >/= 30 associated with increased stroke risk, recommend weight loss, diet and exercise as appropriate   Other Active Problems Hypothyroidism Fibromyalgia GERD Liver cirrhosis 5.3 cm ascending aortic aneurysm Depression  Hospital day # 2  I have personally obtained history,examined this patient, reviewed notes, independently viewed imaging studies, participated in medical decision making and plan of care.ROS completed by me personally and pertinent positives fully documented  I have made any additions or clarifications directly to the above note. Agree with note above.  Patient presented with dizziness and gait imbalance secondary to right superior cerebellar artery infarct secondary to severe basilar artery stenosis likely from atherosclerosis.  Recommend dual antiplatelet therapy and aggressive risk factor modification.  Avoid hypotension and mild permissive hypertension.  Patient is statin  intolerant may need to consider switching to injectables like Leqvio.  Mobilize out of bed.  Therapy consults.  Will defer angioplasty stenting as patient as not been on aggressive medical therapy yet.  Long discussion with patient and Dr Jerral Ralph and answered questions.  Greater than 50% time during this 50-minute visit was spent in counseling and coordination of care about her cerebellar stroke and symptomatic severe basilar artery stenosis and discussion about treatment options including maximal medical therapy and elective angioplasty stenting and answering questions. Delia Heady, MD Medical Director [facility] Pager: [phone] 08/23/2022 4:14 PM   To contact Stroke Continuity provider, please refer to [email or web address]. After hours, contact General Neurology

## 2022-08-23 NOTE — Progress Notes (Signed)
Echocardiogram 2D Echocardiogram has been performed.  Warren Lacy Arcadio Cope RDCS 08/23/2022, 3:51 PM

## 2022-08-23 NOTE — Progress Notes (Signed)
Inpatient Rehab Admissions Coordinator Note:   Per therapy patient was screened for CIR candidacy by Riot Barrick Luvenia Starch, CCC-SLP. At this time, pt appears to be a potential candidate for CIR. I will place an order for rehab consult for full assessment, per our protocol.  Please contact me any with questions.Wolfgang Phoenix, MS, CCC-SLP Admissions Coordinator [phone] 08/23/22 5:52 PM

## 2022-08-23 NOTE — Evaluation (Signed)
Occupational Therapy Evaluation Patient Details Name: Jenny Acosta MRN: [id number] DOB: August 14, 1945 Today's Date: 08/23/2022   History of Present Illness Pt is 77 yo female admitted on 08/21/22 with CAP, vertigo, and found to have acute cerebellum stroke/R PCA distribution.  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  Pt with hx including but not limited to carotid stenosis, HTN, HLD, GERD, hypothyroidism   Clinical Impression   Pt evaluated s/p above admission list. Pt reports modified independence with ADL/IADLs and functional mobility with use of a rollator at baseline. Pt presents this session with generalized weakness, fatigue, reports of persistent dizziness, and reports of blurred/double vision. Pt reports dizziness at baseline from medication, however current dizziness is worse than baseline. Pt reported double vision at start of session with symptoms resolved at end of session, vision to be further assessed. Further cognition to be assessed as pt gave conflicting report of symptoms. Pt educated on [facility] stroke education with pt verbalizing understanding. Pt currently requires setup A for seated UB ADLs and min A for LB ADLs. Pt completed STS transfer using RW and step pivot transfer to EOB with min A and reports of knee weakness without buckling noted. Pt would benefit from continued acute OT services to maximize functional independence and facilitate transition to intensive inpatient follow up therapy, >3 hours/day after discharge.       Recommendations for follow up therapy are one component of a multi-disciplinary discharge planning process, led by the attending physician.  Recommendations may be updated based on patient status, additional functional criteria and insurance authorization.   Assistance Recommended at Discharge Frequent or constant Supervision/Assistance  Patient can return home with the following A little help with walking and/or transfers;A little help with  bathing/dressing/bathroom;Assistance with cooking/housework;Assist for transportation;Help with stairs or ramp for entrance    Functional Status Assessment  Patient has had a recent decline in their functional status and demonstrates the ability to make significant improvements in function in a reasonable and predictable amount of time.  Equipment Recommendations  Other (comment) (defer)    Recommendations for Other Services Rehab consult     Precautions / Restrictions Precautions Precautions: Fall Restrictions Weight Bearing Restrictions: No      Mobility Bed Mobility Overal bed mobility: Needs Assistance Bed Mobility: Sit to Supine       Sit to supine: Min guard   General bed mobility comments: HOB flat    Transfers Overall transfer level: Needs assistance Equipment used: Rolling walker (2 wheels) Transfers: Sit to/from Stand Sit to Stand: Min assist           General transfer comment: STS transfer from recliner using RW with min A and min cues for hand placement. Step pivot transfer from recliner>bed with RW and min A. Pt reports knees feel weak.      Balance Overall balance assessment: Needs assistance Sitting-balance support: No upper extremity supported, Feet supported Sitting balance-Leahy Scale: Good Sitting balance - Comments: sitting EOB   Standing balance support: Bilateral upper extremity supported, Reliant on assistive device for balance Standing balance-Leahy Scale: Poor Standing balance comment: BUE support on RW                           ADL either performed or assessed with clinical judgement   ADL Overall ADL's : Needs assistance/impaired Eating/Feeding: Modified independent;Sitting   Grooming: Wash/dry hands;Set up;Sitting Grooming Details (indicate cue type and reason): washed hands with washcloth at EOB with  setup A Upper Body Bathing: Set up;Sitting   Lower Body Bathing: Minimal assistance;Sit to/from stand   Upper Body  Dressing : Set up;Sitting   Lower Body Dressing: Minimal assistance;Sit to/from stand Lower Body Dressing Details (indicate cue type and reason): pt demonstrated figure four positioning to simulate LB dressing Toilet Transfer: Minimal assistance;Regular Toilet;Rolling walker (2 wheels) Toilet Transfer Details (indicate cue type and reason): simulated Toileting- Clothing Manipulation and Hygiene: Minimal assistance;Sit to/from stand       Functional mobility during ADLs: Minimal assistance;Rolling walker (2 wheels) General ADL Comments: Limited secondary to dizziness, fatigue, and generalized weakness     Vision Baseline Vision/History: 4 Cataracts Ability to See in Adequate Light: 1 Impaired Patient Visual Report: Diplopia;Blurring of vision Vision Assessment?: Vision impaired- to be further tested in functional context Additional Comments: Pt reports blurred and double vision at start of session; double vision resolved at end of session with continued blurred vision.     Perception Perception Perception Tested?: No   Praxis Praxis Praxis tested?: Not tested    Pertinent Vitals/Pain Pain Assessment Pain Assessment: Faces Faces Pain Scale: Hurts a little bit Pain Location: generalized pain Pain Descriptors / Indicators: Discomfort, Aching Pain Intervention(s): Limited activity within patient's tolerance, Monitored during session     Hand Dominance Right   Extremity/Trunk Assessment Upper Extremity Assessment Upper Extremity Assessment: Generalized weakness   Lower Extremity Assessment Lower Extremity Assessment: Defer to PT evaluation   Cervical / Trunk Assessment Cervical / Trunk Assessment: Normal   Communication Communication Communication: HOH   Cognition Arousal/Alertness: Awake/alert Behavior During Therapy: Anxious Overall Cognitive Status: No family/caregiver present to determine baseline cognitive functioning                                  General Comments: Pt A+O x4 however, pt gave conflicing reports of symptoms, required 1 step commands, and perseverated on getting back to bed     General Comments  VSS on RA; complaints of persistent dizziness that is worse than baseline dizziness from medications    Exercises     Shoulder Instructions      Home Living Family/patient expects to be discharged to:: Private residence Living Arrangements: Alone Available Help at Discharge: Family;Available PRN/intermittently (daughter lives in [place] and may be able to stay with her at d/c) Type of Home: Independent living facility Home Access: Level entry     Home Layout: One level     Bathroom Shower/Tub: Producer, television/film/video: Handicapped height     Home Equipment: Rollator (4 wheels);Cane - single point;Shower seat - built in;Grab bars - tub/shower   Additional Comments: If goes to daughter's she has 4-5 steps to enter.      Prior Functioning/Environment Prior Level of Function : Independent/Modified Independent;History of Falls (last six months)             Mobility Comments: Walks with rollator for community distances; reports 1 fall in past 6 months ADLs Comments: Only does showers if daughter there for supervision, otherwise sponge bathes.  Pt able to dress and toilet on her own. Does not drive and has groceries delivered.  Pt fixes light meals.        OT Problem List: Decreased strength;Decreased activity tolerance;Impaired balance (sitting and/or standing);Impaired vision/perception;Pain      OT Treatment/Interventions: Self-care/ADL training;Neuromuscular education;Energy conservation;DME and/or AE instruction;Therapeutic activities;Cognitive remediation/compensation;Visual/perceptual remediation/compensation;Patient/family education;Balance training    OT Goals(Current goals can  be found in the care plan section) Acute Rehab OT Goals Patient Stated Goal: to get back to bed OT Goal  Formulation: With patient Time For Goal Achievement: 09/06/22 Potential to Achieve Goals: Good ADL Goals Pt Will Perform Grooming: with modified independence;standing Pt Will Perform Lower Body Dressing: with modified independence;sit to/from stand Pt Will Transfer to Toilet: with modified independence;ambulating;regular height toilet Additional ADL Goal #1: Pt will independently navigate hospital environment with no safety concerns  OT Frequency: Min 2X/week    Co-evaluation              AM-PAC OT "6 Clicks" Daily Activity     Outcome Measure Help from another person eating meals?: None Help from another person taking care of personal grooming?: A Little Help from another person toileting, which includes using toliet, bedpan, or urinal?: A Little Help from another person bathing (including washing, rinsing, drying)?: A Little Help from another person to put on and taking off regular upper body clothing?: A Little Help from another person to put on and taking off regular lower body clothing?: A Little 6 Click Score: 19   End of Session Equipment Utilized During Treatment: Gait belt;Rolling walker (2 wheels) Nurse Communication: Mobility status  Activity Tolerance: Patient tolerated treatment well Patient left: in bed;with call bell/phone within reach;with bed alarm set  OT Visit Diagnosis: Unsteadiness on feet (R26.81);Muscle weakness (generalized) (M62.81);History of falling (Z91.81);Pain;Dizziness and giddiness (R42)                Time: 1610-9604 OT Time Calculation (min): 18 min Charges:  OT General Charges $OT Visit: 1 Visit OT Evaluation $OT Eval Moderate Complexity: 1 Mod  Sherley Bounds, OTS Acute Rehabilitation Services Office [phone] Secure Chat Communication Preferred   Sherley Bounds 08/23/2022, 12:24 PM

## 2022-08-23 NOTE — Progress Notes (Addendum)
PROGRESS NOTE        PATIENT DETAILS Name: Jenny Acosta Age: 77 y.o. Sex: female Date of Birth: 1945/10/08 Admit Date: 08/21/2022 Admitting Physician Angie Fava, DO ZOX:WRUE, Len Blalock, MD  Brief Summary: Patient is a 77 y.o.  female with history of HTN, HLD, hypothyroidism who presented with vertigo, nausea, vomiting-found to have acute CVA and probable aspiration pneumonia.  Significant events: 6/27>> admit to [facility] at [facility] 6/28>> transferred to [facility]  Significant studies: 6/27>> CT head: No acute abnormalities 6/27>> CT C-spine: No acute cervical spine fracture.  Possible acute fracture of left T2 transverse process. 6/27>> CT angio chest/abdomen/pelvis dissection protocol: No acute intrathoracic/abdominal/pelvic pathology.  5.3 cm ascending aortic aneurysm. 6/28>> MRI brain: Acute infarct right cerebellum and mildly scattered right PCA territory. 6/28>> MRA brain: Severe stenosis of mid basilar artery with proximal tapering-dissection versus thrombus. 6/28>> CT angio head/neck: Severe basilar artery stenosis with appearance that could reflect dissection.  50% stenosis of proximal right ICA. 6/28>> bilateral carotid artery Doppler: 1-39% stenosis bilaterally. 6/29>> LDL: 182 6/29>> A1c: Pending  Significant microbiology data: 6/27>> COVID/influenza/RSV PCR: Negative  Procedures: None  Consults: Neurology  Subjective: Lying comfortably in bed-denies any chest pain or shortness of breath.  Continues to complain of vertigo with movement.  No further vomiting.  Objective: Vitals: Blood pressure (!) 147/60, pulse 83, temperature 98.3 F (36.8 C), temperature source Oral, resp. rate 18, height 5\' 5"  (1.651 m), weight 99.8 kg, SpO2 96 %.   Exam: Gen Exam:Alert awake-not in any distress HEENT:atraumatic, normocephalic Chest: B/L clear to auscultation anteriorly CVS:S1S2 regular Abdomen:soft non tender, non distended Extremities:no  edema Neurology: Non focal Skin: no rash  Pertinent Labs/Radiology:    Latest Ref Rng & Units 08/22/2022    1:13 AM 08/21/2022    1:14 PM 06/17/2022    3:37 PM  CBC  WBC 4.0 - 10.5 K/uL 9.8  9.9  9.7   Hemoglobin 12.0 - 15.0 g/dL 45.4  09.8  11.9   Hematocrit 36.0 - 46.0 % 47.5  47.6  43.3   Platelets 150 - 400 K/uL 253  238  255.0     Lab Results  Component Value Date   NA 136 08/22/2022   K 3.7 08/22/2022   CL 101 08/22/2022   CO2 23 08/22/2022      Assessment/Plan: Acute CVA involving right cerebellum/scattered PCA territory infarct Severe basilar artery stenosis versus dissection Suspected thromboembolic phenomenon given basilar artery stenosis Continues to complain of vertigo Workup as above Continue aspirin/Plavix/statin  Await further recommendations from stroke team  Aspiration pneumonia Presumed aspiration pneumonia in the setting of vomiting due to vertigo/CVA Continue Rocephin/Zithromax Await SLP eval-but does not sound to have overt dysphagia issues.  HLD LDL significantly elevated Add Zetia Probably will need Repatha injections as an outpatient  Hypothyroidism Synthroid TSH stable  GERD PPI  Depression Paxil  Liver cirrhosis Seen incidentally on CT abdomen-will need further workup in the outpatient setting-currently asymptomatic  Possible acute fracture of left T2 transverse process.  Seen incidentally on neuroimaging Obtain CT T spine to confirm  5.3 cm ascending aortic aneurysm Radiology recommending semiannual imaging by CTA or MRA and outpatient cardiothoracic follow-up.  3 mm focal subpleural nodular consolidation right middle lobe No symptoms currently Repeat imaging in 4 to 6 weeks  Obesity: Estimated body mass index is 36.61 kg/m as calculated from  the following:   Height as of this encounter: 5\' 5"  (1.651 m).   Weight as of this encounter: 99.8 kg.   Code status:   Code Status: Full Code   DVT Prophylaxis: SCDs Start:  08/21/22 2110   Family Communication: None at bedside   Disposition Plan: Status is: Inpatient Remains inpatient appropriate because: Severity of illness   Planned Discharge Destination:Rehabilitation facility   Diet: Diet Order             Diet regular Room service appropriate? Yes; Fluid consistency: Thin  Diet effective now                     Antimicrobial agents: Anti-infectives (From admission, onward)    Start     Dose/Rate Route Frequency Ordered Stop   08/22/22 2200  cefTRIAXone (ROCEPHIN) 1 g in sodium chloride 0.9 % 100 mL IVPB  Status:  Discontinued        1 g 200 mL/hr over 30 Minutes Intravenous Every 24 hours 08/21/22 2112 08/21/22 2114   08/22/22 2200  cefTRIAXone (ROCEPHIN) 2 g in sodium chloride 0.9 % 100 mL IVPB        2 g 200 mL/hr over 30 Minutes Intravenous Every 24 hours 08/21/22 2114     08/21/22 2200  azithromycin (ZITHROMAX) 500 mg in sodium chloride 0.9 % 250 mL IVPB        500 mg 250 mL/hr over 60 Minutes Intravenous Every 24 hours 08/21/22 2112     08/21/22 2100  cefTRIAXone (ROCEPHIN) 1 g in sodium chloride 0.9 % 100 mL IVPB  Status:  Discontinued        1 g 200 mL/hr over 30 Minutes Intravenous  Once 08/21/22 2045 08/21/22 2113        MEDICATIONS: Scheduled Meds:   stroke: early stages of recovery book   Does not apply Once   aspirin  81 mg Oral Daily   clopidogrel  75 mg Oral Daily   guaiFENesin  600 mg Oral BID   levothyroxine  100 mcg Oral Q0600   pantoprazole  40 mg Oral Daily   PARoxetine  30 mg Oral Daily   polyethylene glycol  17 g Oral Daily   rosuvastatin  40 mg Oral Daily   Continuous Infusions:  azithromycin 500 mg (08/22/22 2108)   cefTRIAXone (ROCEPHIN)  IV 2 g (08/22/22 2212)   lactated ringers 75 mL/hr at 08/23/22 0207   PRN Meds:.acetaminophen **OR** acetaminophen, benzonatate, meclizine, melatonin, ondansetron (ZOFRAN) IV   I have personally reviewed following labs and imaging studies  LABORATORY  DATA: CBC: Recent Labs  Lab 08/21/22 1314 08/22/22 0113  WBC 9.9 9.8  NEUTROABS 6.8 5.4  HGB 15.1* 15.2*  HCT 47.6* 47.5*  MCV 89.1 88.5  PLT 238 253    Basic Metabolic Panel: Recent Labs  Lab 08/21/22 1314 08/22/22 0113  NA 140 136  K 3.6 3.7  CL 101 101  CO2 26 23  GLUCOSE 125* 110*  BUN 14 10  CREATININE 0.80 0.58  CALCIUM 9.1 8.9  MG 2.3 2.4    GFR: Estimated Creatinine Clearance: 68.9 mL/min (by C-G formula based on SCr of 0.58 mg/dL).  Liver Function Tests: Recent Labs  Lab 08/21/22 1314 08/22/22 0113  AST 28 29  ALT 27 27  ALKPHOS 118 116  BILITOT 0.6 0.7  PROT 8.2* 8.0  ALBUMIN 4.1 4.0   No results for input(s): "LIPASE", "AMYLASE" in the last 168 hours. Recent Labs  Lab 08/21/22  1329  AMMONIA 13    Coagulation Profile: No results for input(s): "INR", "PROTIME" in the last 168 hours.  Cardiac Enzymes: Recent Labs  Lab 08/22/22 0113  CKTOTAL 82    BNP (last 3 results) No results for input(s): "PROBNP" in the last 8760 hours.  Lipid Profile: Recent Labs    08/23/22 0321  CHOL 258*  HDL 61  LDLCALC 182*  TRIG 76  CHOLHDL 4.2    Thyroid Function Tests: Recent Labs    08/22/22 0113  TSH 0.538    Anemia Panel: Recent Labs    08/22/22 0113  VITAMINB12 627    Urine analysis:    Component Value Date/Time   COLORURINE YELLOW 08/21/2022 1400   APPEARANCEUR CLEAR 08/21/2022 1400   LABSPEC 1.021 08/21/2022 1400   PHURINE 5.0 08/21/2022 1400   GLUCOSEU NEGATIVE 08/21/2022 1400   GLUCOSEU NEGATIVE 01/11/2019 1645   HGBUR NEGATIVE 08/21/2022 1400   BILIRUBINUR NEGATIVE 08/21/2022 1400   KETONESUR NEGATIVE 08/21/2022 1400   PROTEINUR 30 (A) 08/21/2022 1400   UROBILINOGEN 0.2 01/11/2019 1645   NITRITE NEGATIVE 08/21/2022 1400   LEUKOCYTESUR NEGATIVE 08/21/2022 1400    Sepsis Labs: Lactic Acid, Venous No results found for: "LATICACIDVEN"  MICROBIOLOGY: Recent Results (from the past 240 hour(s))  Resp panel by  RT-PCR (RSV, Flu A&B, Covid) Anterior Nasal Swab     Status: None   Collection Time: 08/21/22  1:19 PM   Specimen: Anterior Nasal Swab  Result Value Ref Range Status   SARS Coronavirus 2 by RT PCR NEGATIVE NEGATIVE Final    Comment: (NOTE) SARS-CoV-2 target nucleic acids are NOT DETECTED.  The SARS-CoV-2 RNA is generally detectable in upper respiratory specimens during the acute phase of infection. The lowest concentration of SARS-CoV-2 viral copies this assay can detect is 138 copies/mL. A negative result does not preclude SARS-Cov-2 infection and should not be used as the sole basis for treatment or other patient management decisions. A negative result may occur with  improper specimen collection/handling, submission of specimen other than nasopharyngeal swab, presence of viral mutation(s) within the areas targeted by this assay, and inadequate number of viral copies(<138 copies/mL). A negative result must be combined with clinical observations, patient history, and epidemiological information. The expected result is Negative.  Fact Sheet for Patients:  BloggerCourse.com  Fact Sheet for Healthcare Providers:  SeriousBroker.it  This test is no t yet approved or cleared by the [place] FDA and  has been authorized for detection and/or diagnosis of SARS-CoV-2 by FDA under an Emergency Use Authorization (EUA). This EUA will remain  in effect (meaning this test can be used) for the duration of the COVID-19 declaration under Section 564(b)(1) of the Act, 21 U.S.C.section 360bbb-3(b)(1), unless the authorization is terminated  or revoked sooner.       Influenza A by PCR NEGATIVE NEGATIVE Final   Influenza B by PCR NEGATIVE NEGATIVE Final    Comment: (NOTE) The Xpert Xpress SARS-CoV-2/FLU/RSV plus assay is intended as an aid in the diagnosis of influenza from Nasopharyngeal swab specimens and should not be used as a sole basis  for treatment. Nasal washings and aspirates are unacceptable for Xpert Xpress SARS-CoV-2/FLU/RSV testing.  Fact Sheet for Patients: BloggerCourse.com  Fact Sheet for Healthcare Providers: SeriousBroker.it  This test is not yet approved or cleared by the [place] FDA and has been authorized for detection and/or diagnosis of SARS-CoV-2 by FDA under an Emergency Use Authorization (EUA). This EUA will remain in effect (meaning this test can  be used) for the duration of the COVID-19 declaration under Section 564(b)(1) of the Act, 21 U.S.C. section 360bbb-3(b)(1), unless the authorization is terminated or revoked.     Resp Syncytial Virus by PCR NEGATIVE NEGATIVE Final    Comment: (NOTE) Fact Sheet for Patients: BloggerCourse.com  Fact Sheet for Healthcare Providers: SeriousBroker.it  This test is not yet approved or cleared by the [place] FDA and has been authorized for detection and/or diagnosis of SARS-CoV-2 by FDA under an Emergency Use Authorization (EUA). This EUA will remain in effect (meaning this test can be used) for the duration of the COVID-19 declaration under Section 564(b)(1) of the Act, 21 U.S.C. section 360bbb-3(b)(1), unless the authorization is terminated or revoked.  Performed at [facility], [address] [place], [place] [address]     RADIOLOGY STUDIES/RESULTS: VAS US CAROTID (at [facility] and [facility] only)  Result Date: 08/22/2022 Carotid Arterial Duplex Study Patient Name:  [facility]  Date of Exam:   08/22/2022 Medical Rec #: [id number]       Accession #:    [id number] Date of Birth: 01-Feb-1946       Patient Gender: F Patient Age:   71 years Exam Location:  [facility] Procedure:      VAS US CAROTID Referring Phys: Hartley Barefoot --------------------------------------------------------------------------------  Indications:        CVA. Risk Factors:      Hyperlipidemia. Limitations        Today's exam was limited due to the body habitus of the                    patient and the patient's respiratory variation. Comparison Study:  No prior studies. Performing Technologist: Chanda Busing RVT  Examination Guidelines: A complete evaluation includes B-mode imaging, spectral Doppler, color Doppler, and power Doppler as needed of all accessible portions of each vessel. Bilateral testing is considered an integral part of a complete examination. Limited examinations for reoccurring indications may be performed as noted.  Right Carotid Findings: +----------+--------+--------+--------+-----------------------+--------+           PSV cm/sEDV cm/sStenosisPlaque Description     Comments +----------+--------+--------+--------+-----------------------+--------+ CCA Prox  53      8               smooth and heterogenous         +----------+--------+--------+--------+-----------------------+--------+ CCA Distal61      10              smooth and heterogenous         +----------+--------+--------+--------+-----------------------+--------+ ICA Prox  53      9               smooth and heterogenoustortuous +----------+--------+--------+--------+-----------------------+--------+ ICA Mid   48      10                                     tortuous +----------+--------+--------+--------+-----------------------+--------+ ICA Distal51      12                                     tortuous +----------+--------+--------+--------+-----------------------+--------+ ECA       76      10                                              +----------+--------+--------+--------+-----------------------+--------+ +----------+--------+-------+--------+-------------------+  PSV cm/sEDV cmsDescribeArm Pressure (mmHG) +----------+--------+-------+--------+-------------------+ [id number]                                         +----------+--------+-------+--------+-------------------+ +---------+--------+--+--------+-+---------+ VertebralPSV cm/s32EDV cm/s7Antegrade +---------+--------+--+--------+-+---------+  Left Carotid Findings: +----------+--------+--------+--------+-----------------------+--------+           PSV cm/sEDV cm/sStenosisPlaque Description     Comments +----------+--------+--------+--------+-----------------------+--------+ CCA Prox  73      16              smooth and heterogenous         +----------+--------+--------+--------+-----------------------+--------+ CCA Distal61      12              smooth and heterogenous         +----------+--------+--------+--------+-----------------------+--------+ ICA Prox  66      20              smooth and heterogenoustortuous +----------+--------+--------+--------+-----------------------+--------+ ICA Mid   56      15                                     tortuous +----------+--------+--------+--------+-----------------------+--------+ ICA Distal48      15                                     tortuous +----------+--------+--------+--------+-----------------------+--------+ ECA       204     20                                              +----------+--------+--------+--------+-----------------------+--------+ +----------+--------+--------+--------+-------------------+           PSV cm/sEDV cm/sDescribeArm Pressure (mmHG) +----------+--------+--------+--------+-------------------+ [id number]                                         +----------+--------+--------+--------+-------------------+ +---------+--------+--+--------+--+---------+ VertebralPSV cm/s45EDV cm/s10Antegrade +---------+--------+--+--------+--+---------+   Summary: Right Carotid: Velocities in the right ICA are consistent with a 1-39% stenosis. Left Carotid: Velocities in the left ICA are consistent with a 1-39% stenosis. Vertebrals: Bilateral  vertebral arteries demonstrate antegrade flow. *See table(s) above for measurements and observations.  Electronically signed by Coral Else MD on 08/22/2022 at 10:46:13 PM.    Final    CT ANGIO HEAD NECK W WO CM  Result Date: 08/22/2022 CLINICAL DATA:  Neuro deficit, acute, stroke suspected EXAM: CT ANGIOGRAPHY HEAD AND NECK WITH AND WITHOUT CONTRAST TECHNIQUE: Multidetector CT imaging of the head and neck was performed using the standard protocol during bolus administration of intravenous contrast. Multiplanar CT image reconstructions and MIPs were obtained to evaluate the vascular anatomy. Carotid stenosis measurements (when applicable) are obtained utilizing NASCET criteria, using the distal internal carotid diameter as the denominator. RADIATION DOSE REDUCTION: This exam was performed according to the departmental dose-optimization program which includes automated exposure control, adjustment of the mA and/or kV according to patient size and/or use of iterative reconstruction technique. CONTRAST:  80mL OMNIPAQUE IOHEXOL 350 MG/ML SOLN COMPARISON:  None Available. FINDINGS: CT HEAD FINDINGS Brain: Known acute infarcts the cerebellum better characterized on same day MRI. No acute  hemorrhage or progressive mass effect. Additional remote infarcts and chronic microvascular ischemic disease. Cerebral atrophy. No hydrocephalus or most loose improved Vascular: See below. Skull: No acute fracture. Sinuses/Orbits: Clear sinuses.  No acute orbital findings. Other: No mastoid effusions. Review of the MIP images confirms the above findings CTA NECK FINDINGS Aortic arch: Partially imaged ascending aortic aneurysm better characterized on recent CTA chest. Great vessel origins are patent without significant stenosis. Right carotid system: Atherosclerosis involving the proximal ICA with approximately 50% stenosis. Left carotid system: Atherosclerosis involving the proximal ICA without greater than 50% stenosis. Vertebral  arteries: Left dominant. No evidence of hemodynamically significant stenosis in the neck. Skeleton: No acute abnormality. Other neck: No acute abnormality. Upper chest: Visualized lung apices are clear. Review of the MIP images confirms the above findings CTA HEAD FINDINGS Anterior circulation: Right A1 ACA is hypoplastic bilateral intracranial ICAs, MCAs, and ACAs are patent. Moderate right M1 MCA stenosis. Posterior circulation: Moderate proximal and severe distal right intradural vertebral artery stenosis. Mild left intradural vertebral artery stenosis. Severe (nearly occlusive) medium distance stenosis of the mid basilar artery with suggestion of eccentric low-density and distal tapering. The posterior cerebral arteries are patent Venous sinuses: As permitted by contrast timing, patent. Review of the MIP images confirms the above findings IMPRESSION: 1. Severe (nearly occlusive) basilar artery stenosis with an appearance that could reflect dissection (particularly given significant progression since 2015 MRI), although atherosclerotic disease could have a similar appearance. 2. Moderate right M1 MCA stenosis. 3. Approximately 50% stenosis of the proximal right ICA in the neck. 4. Partially imaged ascending aortic aneurysm better characterized on recent CTA chest. Electronically Signed   By: Feliberto Harts M.D.   On: 08/22/2022 14:59   MR ANGIO HEAD WO CONTRAST  Addendum Date: 08/22/2022   ADDENDUM REPORT: 08/22/2022 13:22 ADDENDUM: These results were called by telephone at the time of interpretation on 08/22/2022 at 12:25 pm to provider [facility] , who verbally acknowledged these results. Electronically Signed   By: Orvan Falconer M.D.   On: 08/22/2022 13:22   Result Date: 08/22/2022 CLINICAL DATA:  Neuro deficit, acute, stroke suspected. EXAM: MRA HEAD WITHOUT CONTRAST TECHNIQUE: Angiographic images of the Circle of Willis were acquired using MRA technique without intravenous contrast.  COMPARISON:  MRI brain 08/22/2022. FINDINGS: Moderately motion degraded study.  Within this limitation: Anterior circulation: Intracranial ICAs are patent without significant stenosis. The proximal ACAs and MCAs are patent without significant stenosis. Distal branches are symmetric. Posterior circulation: Severe stenosis of the mid basilar artery with proximal tapering, suspicious for dissection versus thrombus. The SCAs and PICAs are patent proximally. The AICAs are not well visualized, possibly secondary to motion artifact. The PCAs are patent proximally without significant stenosis. Distal branches are symmetric. Anatomic variants: Persistent fetal origin of the left PCA with hypoplastic left P1 segment. Other: None. IMPRESSION: Moderately motion degraded study. Within this limitation, severe stenosis of the mid basilar artery with proximal tapering, suspicious for dissection versus thrombus. Contrast-enhanced vessel imaging may be helpful in distinguishing the etiology of this stenosis. Radiology assistant personnel have been notified to put me in telephone contact with the referring physician or the referring physician's clinical representative in order to discuss these findings. Once this communication is established I will issue an addendum to this report for documentation purposes. Electronically Signed: By: Orvan Falconer M.D. On: 08/22/2022 12:14   MR BRAIN WO CONTRAST  Result Date: 08/22/2022 CLINICAL DATA:  77 year old female with syncope. Altered mental status. Weakness and dizziness.  Vomiting. EXAM: MRI HEAD WITHOUT CONTRAST TECHNIQUE: Multiplanar, multiecho pulse sequences of the brain and surrounding structures were obtained without intravenous contrast. COMPARISON:  CT head and cervical spine yesterday. Previous brain MRI 08/23/2013. FINDINGS: Brain: Patchy, confluent restricted diffusion in the right cerebellum SCA territory. Small areas of acute ischemia involving the midline cerebellar  vermis (series 11, image 11). And small patchy areas of acute infarct also in the right occipital pole, cortex along the right parieto-occipital sulcus. Right thalamus is spared. No brainstem restricted diffusion. No anterior circulation restricted diffusion. Associated cytotoxic edema most pronounced in the right cerebellum SCA territory. No hemorrhagic transformation. No mass effect. Chronic encephalomalacia in the posterior right MCA territory is new since 2015 with mild associated hemosiderin. Widespread patchy bilateral cerebral white matter T2 and FLAIR hyperintensity has progressed since the previous MRI also. Chronic lacunar infarct of the left thalamus is new since 2015. Comparatively mild basal ganglia and pontine T2 heterogeneity. No midline shift, mass effect, evidence of mass lesion, ventriculomegaly, extra-axial collection or acute intracranial hemorrhage. Cervicomedullary junction and pituitary are within normal limits. Vascular: Major intracranial vascular flow voids are stable since 2015. Chronic intracranial artery tortuosity, including vertebrobasilar dolichoectasia. Skull and upper cervical spine: C3-C4 acquired or congenital ankylosis. Otherwise negative for age visible cervical spine. Visualized bone marrow signal is within normal limits. Sinuses/Orbits: Postoperative changes to both globes since 2015. Paranasal Visualized paranasal sinuses and mastoids are stable and well aerated. Other: Visible internal auditory structures appear normal. Negative visible scalp and face. IMPRESSION: 1. Positive for acute infarcts in the Right cerebellum (right SCA territory and vermis), and mildly scattered in the right PCA territory. No hemorrhagic transformation or mass effect. 2. Chronic posterior right MCA infarct is new since the 2015 brain MRI, and moderately progressed other chronic small vessel disease since that time. Electronically Signed   By: Odessa Fleming M.D.   On: 08/22/2022 06:54   CT Angio  Chest/Abd/Pel for Dissection W and/or Wo Contrast  Result Date: 08/21/2022 CLINICAL DATA:  Acute aortic syndrome suspected. EXAM: CT ANGIOGRAPHY CHEST, ABDOMEN AND PELVIS TECHNIQUE: Non-contrast CT of the chest was initially obtained. Multidetector CT imaging through the chest, abdomen and pelvis was performed using the standard protocol during bolus administration of intravenous contrast. Multiplanar reconstructed images and MIPs were obtained and reviewed to evaluate the vascular anatomy. RADIATION DOSE REDUCTION: This exam was performed according to the departmental dose-optimization program which includes automated exposure control, adjustment of the mA and/or kV according to patient size and/or use of iterative reconstruction technique. CONTRAST:  OMNIPAQUE IOHEXOL 350 MG/ML SOLN COMPARISON:  Chest CT dated 11/06/2017 and CT abdomen pelvis dated 08/03/2010. FINDINGS: Evaluation of this exam is limited due to respiratory motion artifact. CTA CHEST FINDINGS Cardiovascular: There is no cardiomegaly or pericardial effusion. Coronary vascular calcification predominately involving the LAD and RCA. Similar appearance of ascending aortic aneurysm measuring up to 5.3 cm. No aortic dissection. There is moderate atherosclerotic calcification of the thoracic aorta. The origins of the great vessels of the aortic arch appear patent. Evaluation of the pulmonary arteries is limited due to respiratory motion. No central pulmonary artery embolus identified. Mediastinum/Nodes: No hilar or mediastinal adenopathy. The esophagus is grossly unremarkable. No mediastinal fluid collection. Lungs/Pleura: Faint 2 mm left apical nodule (19/9) similar to prior CT. Mild diffuse interstitial coarsening and areas of air trapping may be related to underlying small vessel versus small airway disease. A 3 cm focal subpleural nodular consolidation in the right middle lobe may represent atelectasis/scarring.  Developing infiltrate or an area  of infarct are not excluded. Attention on follow-up imaging recommended. There is no pleural effusion or pneumothorax. The central airways are patent. Musculoskeletal: Osteopenia with degenerative changes of the spine. No acute osseous pathology. Review of the MIP images confirms the above findings. CTA ABDOMEN AND PELVIS FINDINGS VASCULAR Aorta: Advanced atherosclerotic calcification of the abdominal aorta. No aneurysmal dilatation or dissection. No periaortic fluid collection. Celiac: The celiac trunk and its major branches are patent. No aneurysmal dilatation or dissection. SMA: The SMA is patent.  No aneurysmal dilatation or dissection. Renals: The renal arteries are patent.  No aneurysm. IMA: The IMA is patent. Inflow: Moderate atherosclerotic calcification of the iliac arteries. No aneurysmal dilatation or dissection. The iliac arteries are patent bilaterally. Veins: No obvious venous abnormality within the limitations of this arterial phase study. Review of the MIP images confirms the above findings. NON-VASCULAR No intra-abdominal free air or free fluid. Hepatobiliary: There is irregularity of the liver contour suggestive of cirrhosis. Clinical correlation is recommended. No biliary dilatation. Cholecystectomy. Pancreas: Unremarkable. No pancreatic ductal dilatation or surrounding inflammatory changes. Spleen: Normal in size without focal abnormality. Adrenals/Urinary Tract: The adrenal glands unremarkable. There is no hydronephrosis on either side. Symmetric excretion of contrast by both kidneys. The visualized ureters and urinary bladder appear unremarkable. Stomach/Bowel: There is large amount of stool throughout the colon. No bowel obstruction or active inflammation. The appendix is not visualized with certainty. No inflammatory changes identified in the right lower quadrant. Lymphatic: No adenopathy. Reproductive: The uterus is anteverted and grossly unremarkable. No adnexal masses. Other: None  Musculoskeletal: Osteopenia with degenerative changes of the spine. Grade 1 L4-L5 anterolisthesis. L2 hemangioma. No acute osseous pathology. Review of the MIP images confirms the above findings. IMPRESSION: 1. No acute intrathoracic, abdominal, or pelvic pathology. 2. Similar appearance of ascending aortic aneurysm measuring up to 5.3 cm. Ascending thoracic aortic aneurysm. Recommend semi-annual imaging followup by CTA or MRA and referral to cardiothoracic surgery if not already obtained. This recommendation follows 2010 ACCF/AHA/AATS/ACR/ASA/SCA/SCAI/SIR/STS/SVM Guidelines for the Diagnosis and Management of Patients With Thoracic Aortic Disease. Circulation. 2010; 121: Z610-R604. Aortic aneurysm NOS (ICD10-I71.9). No aortic dissection. 3. A 3 cm focal subpleural nodular consolidation in the right middle lobe may represent atelectasis/scarring. Developing infiltrate or an area of infarct are not excluded. Attention on follow-up imaging recommended. 4. Cirrhosis. 5. Constipation. No bowel obstruction. 6.  Aortic Atherosclerosis (ICD10-I70.0). Electronically Signed   By: Elgie Collard M.D.   On: 08/21/2022 18:37   CT Head Wo Contrast  Result Date: 08/21/2022 CLINICAL DATA:  Mental status change, unknown cause; Polytrauma, blunt EXAM: CT HEAD WITHOUT CONTRAST CT CERVICAL SPINE WITHOUT CONTRAST TECHNIQUE: Multidetector CT imaging of the head and cervical spine was performed following the standard protocol without intravenous contrast. Multiplanar CT image reconstructions of the cervical spine were also generated. RADIATION DOSE REDUCTION: This exam was performed according to the departmental dose-optimization program which includes automated exposure control, adjustment of the mA and/or kV according to patient size and/or use of iterative reconstruction technique. COMPARISON:  CT Chest 11/06/17 FINDINGS: CT HEAD FINDINGS Brain: Chronic infarct in the right parietal temporal region sequela of moderate  microvascular ischemic change. No CT evidence of an acute cortical infarct. No hemorrhage. No hydrocephalus. No extra-axial fluid collection generalized volume loss Vascular: No hyperdense vessel or unexpected calcification. Skull: Normal. Negative for fracture or focal lesion. Sinuses/Orbits: No middle ear or mastoid effusion air-fluid level in the right sphenoid sinus orbits are notable for bilateral  lens replacement, otherwise unremarkable. Other: None. CT CERVICAL SPINE FINDINGS Alignment: Straightening of the normal cervical lordosis. Skull base and vertebrae: No acute cervical spine fracture. No primary bone lesion or focal pathologic process. Likely an osseous hemangioma at T2. possible acute fracture of the left T2 transverse process (series 4, image 26) Soft tissues and spinal canal: No prevertebral fluid or swelling. No visible canal hematoma. Disc levels:  No evidence of high-grade spinal canal stenosis Upper chest: Biapical ground-glass opacities are favored to represent infectious or inflammatory process. Other: None IMPRESSION: 1. No acute intracranial abnormality. Chronic infarct in the right parietotemporal region. 2. No acute cervical spine fracture. Possible acute fracture of the left T2 transverse process. 3. Biapical ground-glass opacities are favored to represent infectious or inflammatory process. Electronically Signed   By: Lorenza Cambridge M.D.   On: 08/21/2022 14:23   CT Cervical Spine Wo Contrast  Result Date: 08/21/2022 CLINICAL DATA:  Mental status change, unknown cause; Polytrauma, blunt EXAM: CT HEAD WITHOUT CONTRAST CT CERVICAL SPINE WITHOUT CONTRAST TECHNIQUE: Multidetector CT imaging of the head and cervical spine was performed following the standard protocol without intravenous contrast. Multiplanar CT image reconstructions of the cervical spine were also generated. RADIATION DOSE REDUCTION: This exam was performed according to the departmental dose-optimization program which  includes automated exposure control, adjustment of the mA and/or kV according to patient size and/or use of iterative reconstruction technique. COMPARISON:  CT Chest 11/06/17 FINDINGS: CT HEAD FINDINGS Brain: Chronic infarct in the right parietal temporal region sequela of moderate microvascular ischemic change. No CT evidence of an acute cortical infarct. No hemorrhage. No hydrocephalus. No extra-axial fluid collection generalized volume loss Vascular: No hyperdense vessel or unexpected calcification. Skull: Normal. Negative for fracture or focal lesion. Sinuses/Orbits: No middle ear or mastoid effusion air-fluid level in the right sphenoid sinus orbits are notable for bilateral lens replacement, otherwise unremarkable. Other: None. CT CERVICAL SPINE FINDINGS Alignment: Straightening of the normal cervical lordosis. Skull base and vertebrae: No acute cervical spine fracture. No primary bone lesion or focal pathologic process. Likely an osseous hemangioma at T2. possible acute fracture of the left T2 transverse process (series 4, image 26) Soft tissues and spinal canal: No prevertebral fluid or swelling. No visible canal hematoma. Disc levels:  No evidence of high-grade spinal canal stenosis Upper chest: Biapical ground-glass opacities are favored to represent infectious or inflammatory process. Other: None IMPRESSION: 1. No acute intracranial abnormality. Chronic infarct in the right parietotemporal region. 2. No acute cervical spine fracture. Possible acute fracture of the left T2 transverse process. 3. Biapical ground-glass opacities are favored to represent infectious or inflammatory process. Electronically Signed   By: Lorenza Cambridge M.D.   On: 08/21/2022 14:23   DG Chest Portable 1 View  Result Date: 08/21/2022 CLINICAL DATA:  Weakness.  Dizziness.  Known aneurysm EXAM: PORTABLE CHEST 1 VIEW COMPARISON:  X-ray 10/27/2017. FINDINGS: Increasing widening of the mediastinum. Borderline cardiopericardial  silhouette. No consolidation, pneumothorax or effusion. No edema. Films are under penetrated. Overlapping cardiac leads. IMPRESSION: Increasing widening of the mediastinum. With patient's history recommend further workup with chest CT when clinically appropriate. Electronically Signed   By: Karen Kays M.D.   On: 08/21/2022 14:02     LOS: 2 days   Jeoffrey Massed, MD  [facility]    To contact the attending provider between 7A-7P or the covering provider during after hours 7P-7A, please log into the web site [email or web address] and access using universal [place] password for that web  site. If you do not have the password, please call the hospital operator.  08/23/2022, 8:34 AM

## 2022-08-23 NOTE — Progress Notes (Signed)
Physical Therapy Treatment Patient Details Name: Jenny Acosta MRN: [id number] DOB: [birth year] Today's Date: 08/23/2022   History of Present Illness Pt is 77 yo female admitted on 08/21/22 with CAP, vertigo, and found to have acute cerebellum stroke/R PCA distribution.  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  Pt with hx including but not limited to carotid stenosis, HTN, HLD, GERD, hypothyroidism    PT Comments    Pt tolerated today's session well, eager to progress with mobility and return to her baseline. Pt able to progress ambulation distance today with RW, continuing to require minA for balance and increased cues for RW management around obstacles. Educated pt on segmental turning when making 90 or 180 degree turns, pt tolerating well, only reporting dizziness once back into sitting but reports it easing off after brief rest break. Pt will continue to benefit from skilled acute PT to continue to progress mobility, balance, activity tolerance, and safety, discharge recommendations remain appropriate as high intensity PT will allow pt to maximize independence with mobility prior to returning home.     Recommendations for follow up therapy are one component of a multi-disciplinary discharge planning process, led by the attending physician.  Recommendations may be updated based on patient status, additional functional criteria and insurance authorization.  Follow Up Recommendations       Assistance Recommended at Discharge Frequent or constant Supervision/Assistance  Patient can return home with the following A little help with walking and/or transfers;A little help with bathing/dressing/bathroom;Assistance with cooking/housework;Help with stairs or ramp for entrance   Equipment Recommendations  Rolling walker (2 wheels)    Recommendations for Other Services Rehab consult     Precautions / Restrictions Precautions Precautions: Fall Restrictions Weight Bearing  Restrictions: No     Mobility  Bed Mobility Overal bed mobility: Needs Assistance Bed Mobility: Supine to Sit     Supine to sit: Min guard, HOB elevated     General bed mobility comments: minG for safety with transfer to EOB, bed rail utilized    Transfers Overall transfer level: Needs assistance Equipment used: Rolling walker (2 wheels) Transfers: Sit to/from Stand, Bed to chair/wheelchair/BSC Sit to Stand: Min assist   Step pivot transfers: Min assist       General transfer comment: minA to power up into standing, cueing for proper hand placement, increased time to perform pivot to recliner with cueing for technique    Ambulation/Gait Ambulation/Gait assistance: Min assist Gait Distance (Feet): 40 Feet (x4 feet to step to chair initially) Assistive device: Rolling walker (2 wheels) Gait Pattern/deviations: Step-to pattern, Decreased stride length, Shuffle, Trunk flexed, Wide base of support Gait velocity: decreased     General Gait Details: minA for balance and moderate cueing for RW management, especially around obstacles. Increased time and cueing for 180 degree turn, cueing for segmental turn, pt declining any dizziness with mobility until returning to sitting. No evidence of knee buckling today but imbalance present   Optometrist    Modified Rankin (Stroke Patients Only) Modified Rankin (Stroke Patients Only) Pre-Morbid Rankin Score: No significant disability Modified Rankin: Moderately severe disability     Balance Overall balance assessment: Needs assistance Sitting-balance support: No upper extremity supported, Feet supported Sitting balance-Leahy Scale: Good     Standing balance support: Bilateral upper extremity supported, Reliant on assistive device for balance, During functional activity Standing balance-Leahy Scale: Poor Standing balance comment: reliant on RW  Cognition  Arousal/Alertness: Awake/alert Behavior During Therapy: WFL for tasks assessed/performed Overall Cognitive Status: No family/caregiver present to determine baseline cognitive functioning                                 General Comments: pleasant during session, HOH and requiring increased time and cueing for command following        Exercises Other Exercises Other Exercises: sit<>stand x5 reps    General Comments General comments (skin integrity, edema, etc.): VSS on room air, reports dizziness once seated in chair but eases with rest, BP in sitting 149/93      Pertinent Vitals/Pain Pain Assessment Pain Assessment: Faces Faces Pain Scale: Hurts a little bit Pain Location: generalized pain Pain Descriptors / Indicators: Discomfort, Aching Pain Intervention(s): Limited activity within patient's tolerance, Monitored during session, Repositioned    Home Living Family/patient expects to be discharged to:: Private residence Living Arrangements: Alone Available Help at Discharge: Family;Available PRN/intermittently (daughter lives in [place] and may be able to stay with her at d/c) Type of Home: Independent living facility Home Access: Level entry       Home Layout: One level Home Equipment: Rollator (4 wheels);Cane - single point;Shower seat - built in;Grab bars - tub/shower Additional Comments: If goes to daughter's she has 4-5 steps to enter.    Prior Function            PT Goals (current goals can now be found in the care plan section) Acute Rehab PT Goals Patient Stated Goal: improve balance; reports could stay with daughter for a while PT Goal Formulation: With patient Time For Goal Achievement: 09/05/22 Potential to Achieve Goals: Good Progress towards PT goals: Progressing toward goals    Frequency    Min 4X/week      PT Plan Current plan remains appropriate    Co-evaluation              AM-PAC PT "6 Clicks" Mobility   Outcome  Measure  Help needed turning from your back to your side while in a flat bed without using bedrails?: A Little Help needed moving from lying on your back to sitting on the side of a flat bed without using bedrails?: A Little Help needed moving to and from a bed to a chair (including a wheelchair)?: A Little Help needed standing up from a chair using your arms (e.g., wheelchair or bedside chair)?: A Little Help needed to walk in hospital room?: A Lot Help needed climbing 3-5 steps with a railing? : Total 6 Click Score: 15    End of Session Equipment Utilized During Treatment: Gait belt Activity Tolerance: Patient tolerated treatment well Patient left: with chair alarm set;in chair;with call bell/phone within reach Nurse Communication: Mobility status PT Visit Diagnosis: Other abnormalities of gait and mobility (R26.89);Dizziness and giddiness (R42)     Time: 9562-1308 PT Time Calculation (min) (ACUTE ONLY): 26 min  Charges:  $Gait Training: 8-22 mins $Therapeutic Activity: 8-22 mins                     Lindalou Hose, PT DPT Acute Rehabilitation Services Office [phone]    Leonie Man 08/23/2022, 3:04 PM

## 2022-08-23 NOTE — Progress Notes (Signed)
Pt is asking for Claritin. RN messaged MD on call to see if we can get an order.   Jenny Acosta

## 2022-08-23 NOTE — Progress Notes (Signed)
PHARMACIST - PHYSICIAN COMMUNICATION DR:   Ghimire CONCERNING: Antibiotic IV to Oral Route Change Policy  RECOMMENDATION: This patient is receiving azithromycin by the intravenous route.  Based on criteria approved by the Pharmacy and Therapeutics Committee, the antibiotic(s) is/are being converted to the equivalent oral dose form(s).   DESCRIPTION: These criteria include: Patient being treated for a respiratory tract infection, urinary tract infection, cellulitis or clostridium difficile associated diarrhea if on metronidazole The patient is not neutropenic and does not exhibit a GI malabsorption state The patient is eating (either orally or via tube) and/or has been taking other orally administered medications for a least 24 hours The patient is improving clinically and has a Tmax < 100.5  If you have questions about this conversion, please contact the Pharmacy Department  []  ( [phone] )  [place] []  ( [phone] )  [facility] [x]  ( [phone] )  [place] []  ( [phone] )  [facility] []  ( [phone] )  [facility]   

## 2022-08-24 DIAGNOSIS — I651 Occlusion and stenosis of basilar artery: Secondary | ICD-10-CM

## 2022-08-24 DIAGNOSIS — J189 Pneumonia, unspecified organism: Secondary | ICD-10-CM | POA: Diagnosis not present

## 2022-08-24 MED ORDER — LORATADINE 10 MG PO TABS
10.0000 mg | ORAL_TABLET | Freq: Every day | ORAL | Status: DC
  Administered 2022-08-24 – 2022-08-27 (×4): 10 mg via ORAL
  Filled 2022-08-24 (×4): qty 1

## 2022-08-24 MED ORDER — HEPARIN SODIUM (PORCINE) 5000 UNIT/ML IJ SOLN
5000.0000 [IU] | Freq: Three times a day (TID) | INTRAMUSCULAR | Status: DC
  Administered 2022-08-24 – 2022-08-27 (×11): 5000 [IU] via SUBCUTANEOUS
  Filled 2022-08-24 (×11): qty 1

## 2022-08-24 MED ORDER — TRAMADOL HCL 50 MG PO TABS
50.0000 mg | ORAL_TABLET | Freq: Three times a day (TID) | ORAL | Status: DC | PRN
  Administered 2022-08-24 – 2022-08-27 (×6): 50 mg via ORAL
  Filled 2022-08-24 (×6): qty 1

## 2022-08-24 MED ORDER — NORTRIPTYLINE HCL 10 MG PO CAPS
10.0000 mg | ORAL_CAPSULE | Freq: Two times a day (BID) | ORAL | Status: DC
  Administered 2022-08-24 – 2022-08-27 (×6): 10 mg via ORAL
  Filled 2022-08-24 (×8): qty 1

## 2022-08-24 MED ORDER — AMOXICILLIN-POT CLAVULANATE 875-125 MG PO TABS
1.0000 | ORAL_TABLET | Freq: Two times a day (BID) | ORAL | Status: AC
  Administered 2022-08-24 – 2022-08-25 (×4): 1 via ORAL
  Filled 2022-08-24 (×4): qty 1

## 2022-08-24 NOTE — Progress Notes (Signed)
PROGRESS NOTE        PATIENT DETAILS Name: Jenny Acosta Age: 77 y.o. Sex: female Date of Birth: 10-11-1945 Admit Date: 08/21/2022 Admitting Physician Angie Fava, DO WGN:FAOZ, Len Blalock, MD  Brief Summary: Patient is a 77 y.o.  female with history of HTN, HLD, hypothyroidism who presented with vertigo, nausea, vomiting-found to have acute CVA and probable aspiration pneumonia.  Significant events: 6/27>> admit to [facility] at [facility] 6/28>> transferred to [facility]  Significant studies: 6/27>> CT head: No acute abnormalities 6/27>> CT C-spine: No acute cervical spine fracture.  Possible acute fracture of left T2 transverse process. 6/27>> CT angio chest/abdomen/pelvis dissection protocol: No acute intrathoracic/abdominal/pelvic pathology.  5.3 cm ascending aortic aneurysm. 6/28>> MRI brain: Acute infarct right cerebellum and mildly scattered right PCA territory. 6/28>> MRA brain: Severe stenosis of mid basilar artery with proximal tapering-dissection versus thrombus. 6/28>> CT angio head/neck: Severe basilar artery stenosis with appearance that could reflect dissection.  50% stenosis of proximal right ICA. 6/28>> bilateral carotid artery Doppler: 1-39% stenosis bilaterally. 6/29>> LDL: 182 6/29>> A1c: Pending  Significant microbiology data: 6/27>> COVID/influenza/RSV PCR: Negative  Procedures: None  Consults: Neurology  Subjective:   Patient in bed, appears comfortable, denies any headache, no fever, no chest pain or pressure, no shortness of breath , no abdominal pain. No new focal weakness.  Objective: Vitals: Blood pressure (!) 143/66, pulse 87, temperature 98.5 F (36.9 C), temperature source Oral, resp. rate 20, height 5\' 5"  (1.651 m), weight 99.8 kg, SpO2 96 %.   Exam:  Awake Alert, No new F.N deficits, Normal affect [place].AT,PERRAL Supple Neck, No JVD,   Symmetrical Chest wall movement, Good air movement bilaterally, CTAB RRR,No Gallops, Rubs or  new Murmurs,  +ve B.Sounds, Abd Soft, No tenderness,   No Cyanosis, Clubbing or edema    Assessment/Plan:  Acute CVA involving right cerebellum/scattered PCA territory infarct Severe basilar artery stenosis versus dissection This discussed in detail with patient and the stroke team Dr. Pearlean Brownie on 08/24/2022, plan is for DAPT for 3 months thereafter aspirin, continue full dose Crestor add Zetia for better LDL control, patient's basilar artery is most likely atherosclerotic, for now patient and family do not want any invasive procedures.  Plan is follow-up with neurology in the next 4 to 6 weeks and then consider if stenting procedure is required which in turn is quite high risk.  For now continue supportive care for stroke, TOC to decide on appropriate placement in the next few days.  Stable A1c.  Aspiration pneumonia Presumed aspiration pneumonia in the setting of vomiting due to vertigo/CVA On Rocephin and azithromycin, switch to Augmentin for 2 more days, clinically pneumonia resolved Speech therapy following.  HLD LDL significantly elevated Add Zetia Probably will need Repatha injections as an outpatient  Hypothyroidism Synthroid TSH stable  GERD PPI  Depression Paxil  Liver cirrhosis Seen incidentally on CT abdomen-will need further workup in the outpatient setting-currently asymptomatic  Possible acute fracture of left T2 transverse process.  Seen incidentally on neuroimaging Obtain CT T spine to confirm  5.3 cm ascending aortic aneurysm Radiology recommending semiannual imaging by CTA or MRA and outpatient cardiothoracic follow-up.  3 mm focal subpleural nodular consolidation right middle lobe No symptoms currently Repeat imaging in 4 to 6 weeks  Obesity: Estimated body mass index is 36.61 kg/m as calculated from the following:   Height as  of this encounter: 5\' 5"  (1.651 m).   Weight as of this encounter: 99.8 kg.   Code status:   Code Status: Full Code   DVT  Prophylaxis: SCDs Start: 08/21/22 2110 Heparin  Family Communication: None at bedside   Disposition Plan: Status is: Inpatient Remains inpatient appropriate because: Severity of illness   Planned Discharge Destination:Rehabilitation facility   Diet: Diet Order             Diet regular Room service appropriate? Yes; Fluid consistency: Thin  Diet effective now                    MEDICATIONS: Scheduled Meds:  aspirin  81 mg Oral Daily   azithromycin  500 mg Oral q1800   clopidogrel  75 mg Oral Daily   ezetimibe  10 mg Oral Daily   guaiFENesin  600 mg Oral BID   levothyroxine  100 mcg Oral Q0600   loratadine  10 mg Oral Daily   pantoprazole  40 mg Oral Daily   PARoxetine  30 mg Oral Daily   polyethylene glycol  17 g Oral Daily   rosuvastatin  40 mg Oral Daily   Continuous Infusions:  cefTRIAXone (ROCEPHIN)  IV 2 g (08/23/22 2240)   PRN Meds:.acetaminophen **OR** acetaminophen, benzonatate, meclizine, melatonin, ondansetron (ZOFRAN) IV   I have personally reviewed following labs and imaging studies  LABORATORY DATA:  Recent Labs  Lab 08/21/22 1314 08/22/22 0113  WBC 9.9 9.8  HGB 15.1* 15.2*  HCT 47.6* 47.5*  PLT 238 253  MCV 89.1 88.5  MCH 28.3 28.3  MCHC 31.7 32.0  RDW 14.5 14.3  LYMPHSABS 2.3 3.3  MONOABS 0.7 1.0  EOSABS 0.1 0.0  BASOSABS 0.0 0.0    Recent Labs  Lab 08/21/22 1314 08/21/22 1329 08/22/22 0113  NA 140  --  136  K 3.6  --  3.7  CL 101  --  101  CO2 26  --  23  ANIONGAP 13  --  12  GLUCOSE 125*  --  110*  BUN 14  --  10  CREATININE 0.80  --  0.58  AST 28  --  29  ALT 27  --  27  ALKPHOS 118  --  116  BILITOT 0.6  --  0.7  ALBUMIN 4.1  --  4.0  PROCALCITON  --   --  <0.10  TSH  --   --  0.538  AMMONIA  --  13  --   MG 2.3  --  2.4  CALCIUM 9.1  --  8.9    Lab Results  Component Value Date   CHOL 258 (H) 08/23/2022   HDL 61 08/23/2022   LDLCALC 182 (H) 08/23/2022   LDLDIRECT 121.0 06/17/2022   TRIG 76  08/23/2022   CHOLHDL 4.2 08/23/2022    Lab Results  Component Value Date   HGBA1C 5.8 06/17/2022     RADIOLOGY STUDIES/RESULTS: CT THORACIC SPINE WO CONTRAST  Result Date: 08/23/2022 CLINICAL DATA:  Possible left L2 transverse process fracture on MR EXAM: CT THORACIC SPINE WITHOUT CONTRAST TECHNIQUE: Multidetector CT images of the thoracic were obtained using the standard protocol without intravenous contrast. RADIATION DOSE REDUCTION: This exam was performed according to the departmental dose-optimization program which includes automated exposure control, adjustment of the mA and/or kV according to patient size and/or use of iterative reconstruction technique. COMPARISON:  CTA chest dated 08/21/2022 FINDINGS: Alignment: Mild reverse S-shaped thoracic scoliosis. Normal thoracic kyphosis. Vertebrae: No acute  fracture or focal pathologic process. Specifically, the left T2 transverse process is intact. Paraspinal and other soft tissues: Better evaluated on recent CTA chest. Atherosclerotic calcifications. Prior cholecystectomy. Disc levels: Mild multilevel degenerative changes. Spinal canal is patent. IMPRESSION: No fracture is seen. Specifically, the left T2 transverse process is intact. Electronically Signed   By: Charline Bills M.D.   On: 08/23/2022 20:25   ECHOCARDIOGRAM COMPLETE  Result Date: 08/23/2022    ECHOCARDIOGRAM REPORT   Patient Name:   Janiesha Maneri Date of Exam: 08/23/2022 Medical Rec #:  [id number]      Height:       65.0 in Accession #:    [id number]     Weight:       220.0 lb Date of Birth:  12/17/1945      BSA:          2.060 m Patient Age:    77 years       BP:           135/58 mmHg Patient Gender: F              HR:           73 bpm. Exam Location:  Inpatient Procedure: 2D Echo, Color Doppler and Cardiac Doppler Indications:     Stroke i63.9  History:         Patient has prior history of Echocardiogram examinations, most                  recent 10/17/2020. Risk  Factors:Hypertension and Dyslipidemia.  Sonographer:     Irving Burton Senior RDCS Referring Phys:  [id number] Cory Diagnosing Phys: Lennie Odor MD  Sonographer Comments: Technically difficult due to poor echo windows. IMPRESSIONS  1. Left ventricular ejection fraction, by estimation, is 65 to 70%. The left ventricle has normal function. The left ventricle has no regional wall motion abnormalities. Left ventricular diastolic parameters are consistent with Grade I diastolic dysfunction (impaired relaxation).  2. Right ventricular systolic function is normal. The right ventricular size is normal. Tricuspid regurgitation signal is inadequate for assessing PA pressure.  3. The mitral valve is normal in structure. Trivial mitral valve regurgitation.  4. The aortic valve was not well visualized. Aortic valve regurgitation is not visualized.  5. Ascending aortic aneurysm up to 48 mm. 53 mm on recent CT imaging. Aortic dilatation noted. Aneurysm of the ascending aorta, measuring 48 mm.  6. The inferior vena cava is normal in size with greater than 50% respiratory variability, suggesting right atrial pressure of 3 mmHg. Conclusion(s)/Recommendation(s): No intracardiac source of embolism detected on this transthoracic study. Consider a transesophageal echocardiogram to exclude cardiac source of embolism if clinically indicated. FINDINGS  Left Ventricle: Left ventricular ejection fraction, by estimation, is 65 to 70%. The left ventricle has normal function. The left ventricle has no regional wall motion abnormalities. The left ventricular internal cavity size was normal in size. There is  no left ventricular hypertrophy. Left ventricular diastolic parameters are consistent with Grade I diastolic dysfunction (impaired relaxation). Right Ventricle: The right ventricular size is normal. No increase in right ventricular wall thickness. Right ventricular systolic function is normal. Tricuspid regurgitation signal is inadequate for  assessing PA pressure. Left Atrium: Left atrial size was normal in size. Right Atrium: Right atrial size was normal in size. Pericardium: Trivial pericardial effusion is present. Presence of epicardial fat layer. Mitral Valve: The mitral valve is normal in structure. Trivial mitral valve regurgitation. Tricuspid Valve: The tricuspid valve is  grossly normal. Tricuspid valve regurgitation is not demonstrated. No evidence of tricuspid stenosis. Aortic Valve: The aortic valve was not well visualized. Aortic valve regurgitation is not visualized. Pulmonic Valve: The pulmonic valve was grossly normal. Pulmonic valve regurgitation is not visualized. No evidence of pulmonic stenosis. Aorta: Ascending aortic aneurysm up to 48 mm. 53 mm on recent CT imaging. Aortic dilatation noted. There is an aneurysm involving the ascending aorta measuring 48 mm. Venous: The inferior vena cava is normal in size with greater than 50% respiratory variability, suggesting right atrial pressure of 3 mmHg. IAS/Shunts: The atrial septum is grossly normal.  LEFT VENTRICLE PLAX 2D LVIDd:         4.20 cm   Diastology LVIDs:         2.70 cm   LV e' medial:    4.57 cm/s LV PW:         1.00 cm   LV E/e' medial:  15.7 LV IVS:        0.90 cm   LV e' lateral:   5.44 cm/s LVOT diam:     2.20 cm   LV E/e' lateral: 13.2 LV SV:         78 LV SV Index:   38 LVOT Area:     3.80 cm  RIGHT VENTRICLE RV S prime:     10.40 cm/s TAPSE (M-mode): 2.2 cm LEFT ATRIUM             Index        RIGHT ATRIUM           Index LA diam:        4.20 cm 2.04 cm/m   RA Area:     13.00 cm LA Vol (A2C):   56.0 ml 27.19 ml/m  RA Volume:   26.90 ml  13.06 ml/m LA Vol (A4C):   53.3 ml 25.88 ml/m LA Biplane Vol: 55.1 ml 26.75 ml/m  AORTIC VALVE LVOT Vmax:   95.30 cm/s LVOT Vmean:  71.700 cm/s LVOT VTI:    0.204 m  AORTA Ao Root diam: 3.80 cm Ao Asc diam:  4.70 cm MITRAL VALVE MV Area (PHT): 2.05 cm     SHUNTS MV Decel Time: 370 msec     Systemic VTI:  0.20 m MV E velocity: 71.90  cm/s   Systemic Diam: 2.20 cm MV A velocity: 105.00 cm/s MV E/A ratio:  0.68 Lennie Odor MD Electronically signed by Lennie Odor MD Signature Date/Time: 08/23/2022/4:06:58 PM    Final (Updated)    VAS US CAROTID (at [facility] and [facility] only)  Result Date: 08/22/2022 Carotid Arterial Duplex Study Patient Name:  [facility]  Date of Exam:   08/22/2022 Medical Rec #: [id number]       Accession #:    [id number] Date of Birth: Jun 23, 1945       Patient Gender: F Patient Age:   4 years Exam Location:  [facility] Procedure:      VAS US CAROTID Referring Phys: Hartley Barefoot --------------------------------------------------------------------------------  Indications:       CVA. Risk Factors:      Hyperlipidemia. Limitations        Today's exam was limited due to the body habitus of the                    patient and the patient's respiratory variation. Comparison Study:  No prior studies. Performing Technologist: Chanda Busing RVT  Examination Guidelines: A complete evaluation includes B-mode imaging, spectral Doppler,  color Doppler, and power Doppler as needed of all accessible portions of each vessel. Bilateral testing is considered an integral part of a complete examination. Limited examinations for reoccurring indications may be performed as noted.  Right Carotid Findings: +----------+--------+--------+--------+-----------------------+--------+           PSV cm/sEDV cm/sStenosisPlaque Description     Comments +----------+--------+--------+--------+-----------------------+--------+ CCA Prox  53      8               smooth and heterogenous         +----------+--------+--------+--------+-----------------------+--------+ CCA Distal61      10              smooth and heterogenous         +----------+--------+--------+--------+-----------------------+--------+ ICA Prox  53      9               smooth and heterogenoustortuous  +----------+--------+--------+--------+-----------------------+--------+ ICA Mid   48      10                                     tortuous +----------+--------+--------+--------+-----------------------+--------+ ICA Distal51      12                                     tortuous +----------+--------+--------+--------+-----------------------+--------+ ECA       76      10                                              +----------+--------+--------+--------+-----------------------+--------+ +----------+--------+-------+--------+-------------------+           PSV cm/sEDV cmsDescribeArm Pressure (mmHG) +----------+--------+-------+--------+-------------------+ [id number]                                        +----------+--------+-------+--------+-------------------+ +---------+--------+--+--------+-+---------+ VertebralPSV cm/s32EDV cm/s7Antegrade +---------+--------+--+--------+-+---------+  Left Carotid Findings: +----------+--------+--------+--------+-----------------------+--------+           PSV cm/sEDV cm/sStenosisPlaque Description     Comments +----------+--------+--------+--------+-----------------------+--------+ CCA Prox  73      16              smooth and heterogenous         +----------+--------+--------+--------+-----------------------+--------+ CCA Distal61      12              smooth and heterogenous         +----------+--------+--------+--------+-----------------------+--------+ ICA Prox  66      20              smooth and heterogenoustortuous +----------+--------+--------+--------+-----------------------+--------+ ICA Mid   56      15                                     tortuous +----------+--------+--------+--------+-----------------------+--------+ ICA Distal48      15                                     tortuous +----------+--------+--------+--------+-----------------------+--------+ ECA       204  20                                               +----------+--------+--------+--------+-----------------------+--------+ +----------+--------+--------+--------+-------------------+           PSV cm/sEDV cm/sDescribeArm Pressure (mmHG) +----------+--------+--------+--------+-------------------+ [id number]                                         +----------+--------+--------+--------+-------------------+ +---------+--------+--+--------+--+---------+ VertebralPSV cm/s45EDV cm/s10Antegrade +---------+--------+--+--------+--+---------+   Summary: Right Carotid: Velocities in the right ICA are consistent with a 1-39% stenosis. Left Carotid: Velocities in the left ICA are consistent with a 1-39% stenosis. Vertebrals: Bilateral vertebral arteries demonstrate antegrade flow. *See table(s) above for measurements and observations.  Electronically signed by Coral Else MD on 08/22/2022 at 10:46:13 PM.    Final    CT ANGIO HEAD NECK W WO CM  Result Date: 08/22/2022 CLINICAL DATA:  Neuro deficit, acute, stroke suspected EXAM: CT ANGIOGRAPHY HEAD AND NECK WITH AND WITHOUT CONTRAST TECHNIQUE: Multidetector CT imaging of the head and neck was performed using the standard protocol during bolus administration of intravenous contrast. Multiplanar CT image reconstructions and MIPs were obtained to evaluate the vascular anatomy. Carotid stenosis measurements (when applicable) are obtained utilizing NASCET criteria, using the distal internal carotid diameter as the denominator. RADIATION DOSE REDUCTION: This exam was performed according to the departmental dose-optimization program which includes automated exposure control, adjustment of the mA and/or kV according to patient size and/or use of iterative reconstruction technique. CONTRAST:  80mL OMNIPAQUE IOHEXOL 350 MG/ML SOLN COMPARISON:  None Available. FINDINGS: CT HEAD FINDINGS Brain: Known acute infarcts the cerebellum better characterized on same day MRI. No acute  hemorrhage or progressive mass effect. Additional remote infarcts and chronic microvascular ischemic disease. Cerebral atrophy. No hydrocephalus or most loose improved Vascular: See below. Skull: No acute fracture. Sinuses/Orbits: Clear sinuses.  No acute orbital findings. Other: No mastoid effusions. Review of the MIP images confirms the above findings CTA NECK FINDINGS Aortic arch: Partially imaged ascending aortic aneurysm better characterized on recent CTA chest. Great vessel origins are patent without significant stenosis. Right carotid system: Atherosclerosis involving the proximal ICA with approximately 50% stenosis. Left carotid system: Atherosclerosis involving the proximal ICA without greater than 50% stenosis. Vertebral arteries: Left dominant. No evidence of hemodynamically significant stenosis in the neck. Skeleton: No acute abnormality. Other neck: No acute abnormality. Upper chest: Visualized lung apices are clear. Review of the MIP images confirms the above findings CTA HEAD FINDINGS Anterior circulation: Right A1 ACA is hypoplastic bilateral intracranial ICAs, MCAs, and ACAs are patent. Moderate right M1 MCA stenosis. Posterior circulation: Moderate proximal and severe distal right intradural vertebral artery stenosis. Mild left intradural vertebral artery stenosis. Severe (nearly occlusive) medium distance stenosis of the mid basilar artery with suggestion of eccentric low-density and distal tapering. The posterior cerebral arteries are patent Venous sinuses: As permitted by contrast timing, patent. Review of the MIP images confirms the above findings IMPRESSION: 1. Severe (nearly occlusive) basilar artery stenosis with an appearance that could reflect dissection (particularly given significant progression since 2015 MRI), although atherosclerotic disease could have a similar appearance. 2. Moderate right M1 MCA stenosis. 3. Approximately 50% stenosis of the proximal right ICA in the neck. 4.  Partially imaged ascending aortic aneurysm better characterized on recent CTA  chest. Electronically Signed   By: Feliberto Harts M.D.   On: 08/22/2022 14:59   MR ANGIO HEAD WO CONTRAST  Addendum Date: 08/22/2022   ADDENDUM REPORT: 08/22/2022 13:22 ADDENDUM: These results were called by telephone at the time of interpretation on 08/22/2022 at 12:25 pm to provider [facility] , who verbally acknowledged these results. Electronically Signed   By: Orvan Falconer M.D.   On: 08/22/2022 13:22   Result Date: 08/22/2022 CLINICAL DATA:  Neuro deficit, acute, stroke suspected. EXAM: MRA HEAD WITHOUT CONTRAST TECHNIQUE: Angiographic images of the Circle of Willis were acquired using MRA technique without intravenous contrast. COMPARISON:  MRI brain 08/22/2022. FINDINGS: Moderately motion degraded study.  Within this limitation: Anterior circulation: Intracranial ICAs are patent without significant stenosis. The proximal ACAs and MCAs are patent without significant stenosis. Distal branches are symmetric. Posterior circulation: Severe stenosis of the mid basilar artery with proximal tapering, suspicious for dissection versus thrombus. The SCAs and PICAs are patent proximally. The AICAs are not well visualized, possibly secondary to motion artifact. The PCAs are patent proximally without significant stenosis. Distal branches are symmetric. Anatomic variants: Persistent fetal origin of the left PCA with hypoplastic left P1 segment. Other: None. IMPRESSION: Moderately motion degraded study. Within this limitation, severe stenosis of the mid basilar artery with proximal tapering, suspicious for dissection versus thrombus. Contrast-enhanced vessel imaging may be helpful in distinguishing the etiology of this stenosis. Radiology assistant personnel have been notified to put me in telephone contact with the referring physician or the referring physician's clinical representative in order to discuss these findings. Once this  communication is established I will issue an addendum to this report for documentation purposes. Electronically Signed: By: Orvan Falconer M.D. On: 08/22/2022 12:14     LOS: 3 days   Signature  -    Susa Raring M.D on 08/24/2022 at 8:22 AM   -  To page go to [email or web address]

## 2022-08-24 NOTE — Progress Notes (Addendum)
Inpatient Rehab Admissions:  Inpatient Rehab Consult received.  I met with patient at the bedside for rehabilitation assessment and to discuss goals and expectations of an inpatient rehab admission.  Discussed average length of stay, insurance authorization requirement, discharge home after completion of CIR. Pt acknowledged understanding. Pt interested in pursuing CIR. Pt gave permission to contact daughter Verlon Au. Attempted to contact [place]. Received no answer. Left a message; awaiting return call. Will continue to follow.   1535:  Spoke with pt's daughter Verlon Au on the telephone. Discussed CIR goals and expectations. She acknowledged understanding. She is supportive of pt pursuing CIR. She confirmed that she will be able to provide 24/7 support for pt after discharge.    Wolfgang Phoenix, MS, CCC-SLP Admissions Coordinator [phone]

## 2022-08-24 NOTE — PMR Pre-admission (Signed)
PMR Admission Coordinator Pre-Admission Assessment  Patient: Jenny Acosta is an 77 y.o., female MRN: [id number] DOB: [birth year] Height: 5\' 5"  (165.1 cm) Weight: 99.8 kg  Insurance Information HMO: ***    PPO: ***     PCP:      IPA:      80/20:      OTHER:  PRIMARY: [employer] Medicare      Policy#: [id number]      Subscriber: patient CM Name: ***      Phone#: ***     Fax#: *** Pre-Cert#: ***      Employer: *** Benefits:  Phone #: ***     Name: *** Dolores Hoose. Date: ***     Deduct: ***      Out of Pocket Max: ***      Life Max: *** CIR: ***      SNF: *** Outpatient: ***     Co-Pay: *** Home Health: ***      Co-Pay: *** DME: ***     Co-Pay: *** Providers: in-network SECONDARY:       Policy#:      Phone#:   Financial Counselor:       Phone#:  The Data processing manager" for patients in Inpatient Rehabilitation Facilities with attached "Privacy Act Statement-Health Care Records" was provided and verbally reviewed with: {CHL IP Patient Family NW:[id number]}  Emergency Contact Information Contact Information     Name Relation Home Work Mobile   Cunningham,Leslie Daughter [phone]  [phone]   Agustina Caroli [phone]  [phone]       Current Medical History  Patient Admitting Diagnosis: CVA History of Present Illness: Pt is a 77 year old female with medical hx significant for: ascending thoracic aortic aneurysm, hyperlipidemia, GERD, carotid stenosis, acquired hypothyroidism.  Pt presented to [facility] on 08/21[facility]22 d/t weakness and syncopal episode. Pt vomited after syncopal episode. CT head and cervical spine negative for acute abnormalities. Chest x-ray concerning for PNA. CTA chest/abdomen/pelvis showed known ascending thoracic aortic aneurysm without corresponding evidence of dissection. CTA chest also showed right middle lobe airspace opacity concerning for infiltrate without evidence of edema, pleural effusion or pneumothorax. Pt transferred to [facility] on 08/22/22.  MRI revealed acute infarcts in right cerebellum and mildly scattered in right PCA territory. MRA revealed severe stenosis of mild basilar artery with proximal tapering, suspicious for dissection versus thrombus. CTA head and neck showed severe basilar artery stenosis with an appearance that could reflect dissection; 50% stenosis of proximal right ICA. Bilateral carotid artery doppler showed 1-39% stenosis bilateral. Therapy evaluations completed and CIR recommended d/t pt's deficits in functional mobility and inability to complete ADLs independently.  Complete NIHSS TOTAL: 0  Patient's medical record from [facility] has been reviewed by the rehabilitation admission coordinator and physician.  Past Medical History  Past Medical History:  Diagnosis Date   Allergic rhinitis, cause unspecified    Arthritis    Carotid stenosis    Family history of colon cancer 07/21/2013   mother   Gallstone    GERD (gastroesophageal reflux disease)    Hyperlipidemia 08/30/2010   Hypertension    Hypothyroidism    Impaired glucose tolerance    Pain, coccyx    Right lumbar radiculopathy 08/30/2010    Has the patient had major surgery during 100 days prior to admission? No  Family History   family history includes Arthritis in her sister; Atrial fibrillation in her sister; Colon cancer in her mother; Diabetes in her daughter and maternal  grandmother; Stroke in her mother.  Current Medications  Current Facility-Administered Medications:    acetaminophen (TYLENOL) tablet 650 mg, 650 mg, Oral, Q6H PRN, 650 mg at 08/24/22 0427 **OR** acetaminophen (TYLENOL) suppository 650 mg, 650 mg, Rectal, Q6H PRN, Regalado, Belkys A, MD   amoxicillin-clavulanate (AUGMENTIN) 875-125 MG per tablet 1 tablet, 1 tablet, Oral, Q12H, Leroy Sea, MD, 1 tablet at 08/24/22 0930   aspirin chewable tablet 81 mg, 81 mg, Oral, Daily, Regalado, Belkys A, MD, 81 mg at 08/24/22 0932   benzonatate (TESSALON)  capsule 200 mg, 200 mg, Oral, TID PRN, Regalado, Belkys A, MD, 200 mg at 08/22/22 1137   clopidogrel (PLAVIX) tablet 75 mg, 75 mg, Oral, Daily, Regalado, Belkys A, MD, 75 mg at 08/24/22 0931   ezetimibe (ZETIA) tablet 10 mg, 10 mg, Oral, Daily, Ghimire, Shanker M, MD, 10 mg at 08/24/22 0930   guaiFENesin (MUCINEX) 12 hr tablet 600 mg, 600 mg, Oral, BID, Regalado, Belkys A, MD, 600 mg at 08/24/22 0931   heparin injection 5,000 Units, 5,000 Units, Subcutaneous, Q8H, Leroy Sea, MD, 5,000 Units at 08/24/22 0931   levothyroxine (SYNTHROID) tablet 100 mcg, 100 mcg, Oral, Q0600, Regalado, Belkys A, MD, 100 mcg at 08/24/22 0555   loratadine (CLARITIN) tablet 10 mg, 10 mg, Oral, Daily, Susa Raring K, MD, 10 mg at 08/24/22 0931   meclizine (ANTIVERT) tablet 25 mg, 25 mg, Oral, TID PRN, Regalado, Belkys A, MD   melatonin tablet 3 mg, 3 mg, Oral, QHS PRN, Regalado, Belkys A, MD   ondansetron (ZOFRAN) injection 4 mg, 4 mg, Intravenous, Q6H PRN, Regalado, Belkys A, MD, 4 mg at 08/24/22 1127   pantoprazole (PROTONIX) EC tablet 40 mg, 40 mg, Oral, Daily, Regalado, Belkys A, MD, 40 mg at 08/24/22 0931   PARoxetine (PAXIL) tablet 30 mg, 30 mg, Oral, Daily, Regalado, Belkys A, MD, 30 mg at 08/24/22 0932   polyethylene glycol (MIRALAX / GLYCOLAX) packet 17 g, 17 g, Oral, Daily, Regalado, Belkys A, MD, 17 g at 08/24/22 0932   rosuvastatin (CRESTOR) tablet 40 mg, 40 mg, Oral, Daily, Regalado, Belkys A, MD, 40 mg at 08/24/22 0931   traMADol (ULTRAM) tablet 50 mg, 50 mg, Oral, Q8H PRN, Leroy Sea, MD, 50 mg at 08/24/22 1610  Patients Current Diet:  Diet Order             Diet regular Room service appropriate? Yes; Fluid consistency: Thin  Diet effective now                   Precautions / Restrictions Precautions Precautions: Fall Restrictions Weight Bearing Restrictions: No   Has the patient had 2 or more falls or a fall with injury in the past year? Yes  Prior Activity  Level Limited Community (1-2x/wk): leaves for doctor's appointments  Prior Functional Level Self Care: Did the patient need help bathing, dressing, using the toilet or eating? Independent  Indoor Mobility: Did the patient need assistance with walking from room to room (with or without device)? Independent  Stairs: Did the patient need assistance with internal or external stairs (with or without device)? Pt avoids stairs  Functional Cognition: Did the patient need help planning regular tasks such as shopping or remembering to take medications? Independent  Patient Information Are you of Hispanic, Latino/a,or Spanish origin?: A. No, not of Hispanic, Latino/a, or Spanish origin What is your race?: A. White Do you need or want an interpreter to communicate with a doctor or health care staff?: 0.  No  Patient's Response To:  Health Literacy and Transportation Is the patient able to respond to health literacy and transportation needs?: Yes Health Literacy - How often do you need to have someone help you when you read instructions, pamphlets, or other written material from your doctor or pharmacy?: Never In the past 12 months, has lack of transportation kept you from medical appointments or from getting medications?: Yes (x1) In the past 12 months, has lack of transportation kept you from meetings, work, or from getting things needed for daily living?: No  Home Assistive Devices / Equipment Home Assistive Devices/Equipment: Environmental consultant (specify type) (rollator) Home Equipment: Rollator (4 wheels), Cane - single point, Shower seat - built in, [employer] - tub/shower  Prior Device Use: Indicate devices/aids used by the patient prior to current illness, exacerbation or injury?  Rollator  Current Functional Level Cognition  Arousal/Alertness: Awake/alert Overall Cognitive Status: Within Functional Limits for tasks assessed Orientation Level: Oriented X4 General Comments: pleasant during session, HOH  and requiring increased time and cueing for command following Memory: Appears intact Awareness: Appears intact Problem Solving: Appears intact Safety/Judgment: Appears intact    Extremity Assessment (includes Sensation/Coordination)  Upper Extremity Assessment: Generalized weakness RUE Deficits / Details: ROM WFL; MMT grossly 5/5 LUE Deficits / Details: ROM WFL; MMT grossly 4/5 throughout  Lower Extremity Assessment: Defer to PT evaluation RLE Deficits / Details: ROM WFL; MMT 5/5 but reports weak in standing RLE Sensation:  (reports stinging sensation R lower leg since CVA) LLE Deficits / Details: ROM WFL ; MMT 5/5 but reports weak in standing    ADLs  Overall ADL's : Needs assistance/impaired Eating/Feeding: Modified independent, Sitting Grooming: Wash/dry hands, Set up, Sitting Grooming Details (indicate cue type and reason): washed hands with washcloth at EOB with setup A Upper Body Bathing: Set up, Sitting Lower Body Bathing: Minimal assistance, Sit to/from stand Upper Body Dressing : Set up, Sitting Lower Body Dressing: Minimal assistance, Sit to/from stand Lower Body Dressing Details (indicate cue type and reason): pt demonstrated figure four positioning to simulate LB dressing Toilet Transfer: Minimal assistance, Regular Toilet, Rolling walker (2 wheels) Toilet Transfer Details (indicate cue type and reason): simulated Toileting- Clothing Manipulation and Hygiene: Minimal assistance, Sit to/from stand Functional mobility during ADLs: Minimal assistance, Rolling walker (2 wheels) General ADL Comments: Limited secondary to dizziness, fatigue, and generalized weakness    Mobility  Overal bed mobility: Needs Assistance Bed Mobility: Supine to Sit Supine to sit: Min guard, HOB elevated Sit to supine: Min guard General bed mobility comments: minG for safety with transfer to EOB, bed rail utilized    Transfers  Overall transfer level: Needs assistance Equipment used: Rolling  walker (2 wheels) Transfers: Sit to/from Stand, Bed to chair/wheelchair/BSC Sit to Stand: Min assist Bed to/from chair/wheelchair/BSC transfer type:: Step pivot Step pivot transfers: Min assist General transfer comment: minA to power up into standing, cueing for proper hand placement, increased time to perform pivot to recliner with cueing for technique    Ambulation / Gait / Stairs / Wheelchair Mobility  Ambulation/Gait Ambulation/Gait assistance: Editor, commissioning (Feet): 40 Feet (x4 feet to step to chair initially) Assistive device: Rolling walker (2 wheels) Gait Pattern/deviations: Step-to pattern, Decreased stride length, Shuffle, Trunk flexed, Wide base of support General Gait Details: minA for balance and moderate cueing for RW management, especially around obstacles. Increased time and cueing for 180 degree turn, cueing for segmental turn, pt declining any dizziness with mobility until returning to sitting. No evidence  of knee buckling today but imbalance present Gait velocity: decreased    Posture / Balance Dynamic Sitting Balance Sitting balance - Comments: sitting EOB Balance Overall balance assessment: Needs assistance Sitting-balance support: No upper extremity supported, Feet supported Sitting balance-Leahy Scale: Good Sitting balance - Comments: sitting EOB Standing balance support: Bilateral upper extremity supported, Reliant on assistive device for balance, During functional activity Standing balance-Leahy Scale: Poor Standing balance comment: reliant on RW    Special needs/care consideration NA   Previous Home Environment (from acute therapy documentation) Living Arrangements: Alone Available Help at Discharge: Family, Available 24 hours/day Type of Home: Independent living facility Home Layout: One level Home Access: Level entry Bathroom Shower/Tub: Health visitor: Handicapped height Bathroom Accessibility: Yes How Accessible: Accessible  via wheelchair, Accessible via walker Home Care Services: No Additional Comments: If goes to daughter's she has 4-5 steps to enter.  Discharge Living Setting Plans for Discharge Living Setting: Apartment (daughter's apartment) Type of Home at Discharge: Apartment Discharge Home Layout: One level Discharge Home Access: Stairs to enter Entrance Stairs-Rails: Right Entrance Stairs-Number of Steps: 3 Discharge Bathroom Shower/Tub: Tub/shower unit Discharge Bathroom Toilet: Standard Does the patient have any problems obtaining your medications?: No  Social/Family/Support Systems    Goals Patient/Family Goal for Rehab: *** Expected length of stay: ***  Decrease burden of Care through IP rehab admission: NA  Possible need for SNF placement upon discharge: Not anticipated  Patient Condition: I have reviewed medical records from [facility], spoken with CM, and {CHL IP Patient Spouse Son Daughter Family Member:[id number]}. I {CHL IP at bedside or phone:304550005} for inpatient rehabilitation assessment.  Patient will benefit from ongoing PT and OT, can actively participate in 3 hours of therapy a day 5 days of the week, and can make measurable gains during the admission.  Patient will also benefit from the coordinated team approach during an Inpatient Acute Rehabilitation admission.  The patient will receive intensive therapy as well as Rehabilitation physician, nursing, social worker, and care management interventions.  Due to safety, disease management, medication administration, and patient education the patient requires 24 hour a day rehabilitation nursing.  The patient is currently *** with mobility and basic ADLs.  Discharge setting and therapy post discharge at [facility] IP discharge location:[id number]} is anticipated.  Patient has agreed to participate in the Acute Inpatient Rehabilitation Program and will admit {Time; today/tomorrow:10263}.  Preadmission Screen Completed By:  Domingo Pulse, 08/24/2022 2:49 PM ______________________________________________________________________   Discussed status with Dr. Marland Kitchen on *** at *** and received approval for admission today.  Admission Coordinator:  Domingo Pulse, CCC-SLP, time ***/Date ***   Assessment/Plan: Diagnosis: Does the need for close, 24 hr/day Medical supervision in concert with the patient's rehab needs make it unreasonable for this patient to be served in a less intensive setting? {yes_no_potentially:3041433} Co-Morbidities requiring supervision/potential complications: *** Due to {due VW:0981191}, does the patient require 24 hr/day rehab nursing? {yes_no_potentially:3041433} Does the patient require coordinated care of a physician, rehab nurse, PT, OT, and SLP to address physical and functional deficits in the context of the above medical diagnosis(es)? {yes_no_potentially:3041433} Addressing deficits in the following areas: {deficits:3041436} Can the patient actively participate in an intensive therapy program of at least 3 hrs of therapy 5 days a week? {yes_no_potentially:3041433} The potential for patient to make measurable gains while on inpatient rehab is {potential:3041437} Anticipated functional outcomes upon discharge from inpatient rehab: {functional outcomes:304600100} PT, {functional outcomes:304600100} OT, {functional outcomes:304600100} SLP Estimated rehab length of  stay to reach the above functional goals is: *** Anticipated discharge destination: {anticipated dc setting:21604} 10. Overall Rehab/Functional Prognosis: {potential:3041437}   MD Signature: ***

## 2022-08-24 NOTE — Progress Notes (Signed)
STROKE TEAM PROGRESS NOTE   BRIEF HPI [name] is a 77 y.o. female with history of carotid stenosis, HTN, HLD, GERD, hypothyroidism, who presented to [facility] ED from her facility for weakness, dizziness and sustaining a fall.  presenting 6/27 after falling. She reports double vision, dizziness.  She may additionally have some aspiration pneumonia which is being managed by the primary team.   SIGNIFICANT HOSPITAL EVENTS 6/27- admitted  6/29-- loaded with plavix 6/28-MRI right superior cerebellar artery infarct.  Small scattered right PCA infarcts 6/28-CT angiogram severe near occlusive basilar artery stenosis.  Moderate right M1 stenosis.  50% proximal right ICA stenosis in the neck.   INTERM HISTORY/SUBJECTIVE Patient is lying in bed comfortably..  She continues to complain of dizziness and therapy recommend inpatient rehab  OBJECTIVE  CBC    Component Value Date/Time   WBC 9.8 08/22/2022 0113   RBC 5.37 (H) 08/22/2022 0113   HGB 15.2 (H) 08/22/2022 0113   HGB 14.8 12/03/2013 2155   HCT 47.5 (H) 08/22/2022 0113   HCT 46.7 12/03/2013 2155   PLT 253 08/22/2022 0113   PLT 275 12/03/2013 2155   MCV 88.5 08/22/2022 0113   MCV 89 12/03/2013 2155   MCH 28.3 08/22/2022 0113   MCHC 32.0 08/22/2022 0113   RDW 14.3 08/22/2022 0113   RDW 14.1 12/03/2013 2155   LYMPHSABS 3.3 08/22/2022 0113   MONOABS 1.0 08/22/2022 0113   EOSABS 0.0 08/22/2022 0113   BASOSABS 0.0 08/22/2022 0113    BMET    Component Value Date/Time   NA 136 08/22/2022 0113   NA 142 12/03/2013 2155   K 3.7 08/22/2022 0113   K 3.7 12/03/2013 2155   CL 101 08/22/2022 0113   CL 109 (H) 12/03/2013 2155   CO2 23 08/22/2022 0113   CO2 25 12/03/2013 2155   GLUCOSE 110 (H) 08/22/2022 0113   GLUCOSE 106 (H) 12/03/2013 2155   BUN 10 08/22/2022 0113   BUN 10 12/03/2013 2155   CREATININE 0.58 08/22/2022 0113   CREATININE 0.71 12/03/2013 2155   CALCIUM 8.9 08/22/2022 0113   CALCIUM 8.7 12/03/2013 2155   GFRNONAA  >60 08/22/2022 0113   GFRNONAA >60 12/03/2013 2155    IMAGING past 24 hours CT THORACIC SPINE WO CONTRAST  Result Date: 08/23/2022 CLINICAL DATA:  Possible left L2 transverse process fracture on MR EXAM: CT THORACIC SPINE WITHOUT CONTRAST TECHNIQUE: Multidetector CT images of the thoracic were obtained using the standard protocol without intravenous contrast. RADIATION DOSE REDUCTION: This exam was performed according to the departmental dose-optimization program which includes automated exposure control, adjustment of the mA and/or kV according to patient size and/or use of iterative reconstruction technique. COMPARISON:  CTA chest dated 08/21/2022 FINDINGS: Alignment: Mild reverse S-shaped thoracic scoliosis. Normal thoracic kyphosis. Vertebrae: No acute fracture or focal pathologic process. Specifically, the left T2 transverse process is intact. Paraspinal and other soft tissues: Better evaluated on recent CTA chest. Atherosclerotic calcifications. Prior cholecystectomy. Disc levels: Mild multilevel degenerative changes. Spinal canal is patent. IMPRESSION: No fracture is seen. Specifically, the left T2 transverse process is intact. Electronically Signed   By: Charline Bills M.D.   On: 08/23/2022 20:25   ECHOCARDIOGRAM COMPLETE  Result Date: 08/23/2022    ECHOCARDIOGRAM REPORT   Patient Name:   Jenny Acosta Date of Exam: 08/23/2022 Medical Rec #:  [id number]      Height:       65.0 in Accession #:    [id number]     Weight:  220.0 lb Date of Birth:  Sep 16, 1945      BSA:          2.060 m Patient Age:    77 years       BP:           135/58 mmHg Patient Gender: F              HR:           73 bpm. Exam Location:  Inpatient Procedure: 2D Echo, Color Doppler and Cardiac Doppler Indications:     Stroke i63.9  History:         Patient has prior history of Echocardiogram examinations, most                  recent 10/17/2020. Risk Factors:Hypertension and Dyslipidemia.  Sonographer:     Irving Burton Senior RDCS  Referring Phys:  [id number] Cory Diagnosing Phys: Lennie Odor MD  Sonographer Comments: Technically difficult due to poor echo windows. IMPRESSIONS  1. Left ventricular ejection fraction, by estimation, is 65 to 70%. The left ventricle has normal function. The left ventricle has no regional wall motion abnormalities. Left ventricular diastolic parameters are consistent with Grade I diastolic dysfunction (impaired relaxation).  2. Right ventricular systolic function is normal. The right ventricular size is normal. Tricuspid regurgitation signal is inadequate for assessing PA pressure.  3. The mitral valve is normal in structure. Trivial mitral valve regurgitation.  4. The aortic valve was not well visualized. Aortic valve regurgitation is not visualized.  5. Ascending aortic aneurysm up to 48 mm. 53 mm on recent CT imaging. Aortic dilatation noted. Aneurysm of the ascending aorta, measuring 48 mm.  6. The inferior vena cava is normal in size with greater than 50% respiratory variability, suggesting right atrial pressure of 3 mmHg. Conclusion(s)/Recommendation(s): No intracardiac source of embolism detected on this transthoracic study. Consider a transesophageal echocardiogram to exclude cardiac source of embolism if clinically indicated. FINDINGS  Left Ventricle: Left ventricular ejection fraction, by estimation, is 65 to 70%. The left ventricle has normal function. The left ventricle has no regional wall motion abnormalities. The left ventricular internal cavity size was normal in size. There is  no left ventricular hypertrophy. Left ventricular diastolic parameters are consistent with Grade I diastolic dysfunction (impaired relaxation). Right Ventricle: The right ventricular size is normal. No increase in right ventricular wall thickness. Right ventricular systolic function is normal. Tricuspid regurgitation signal is inadequate for assessing PA pressure. Left Atrium: Left atrial size was normal in size.  Right Atrium: Right atrial size was normal in size. Pericardium: Trivial pericardial effusion is present. Presence of epicardial fat layer. Mitral Valve: The mitral valve is normal in structure. Trivial mitral valve regurgitation. Tricuspid Valve: The tricuspid valve is grossly normal. Tricuspid valve regurgitation is not demonstrated. No evidence of tricuspid stenosis. Aortic Valve: The aortic valve was not well visualized. Aortic valve regurgitation is not visualized. Pulmonic Valve: The pulmonic valve was grossly normal. Pulmonic valve regurgitation is not visualized. No evidence of pulmonic stenosis. Aorta: Ascending aortic aneurysm up to 48 mm. 53 mm on recent CT imaging. Aortic dilatation noted. There is an aneurysm involving the ascending aorta measuring 48 mm. Venous: The inferior vena cava is normal in size with greater than 50% respiratory variability, suggesting right atrial pressure of 3 mmHg. IAS/Shunts: The atrial septum is grossly normal.  LEFT VENTRICLE PLAX 2D LVIDd:         4.20 cm   Diastology LVIDs:  2.70 cm   LV e' medial:    4.57 cm/s LV PW:         1.00 cm   LV E/e' medial:  15.7 LV IVS:        0.90 cm   LV e' lateral:   5.44 cm/s LVOT diam:     2.20 cm   LV E/e' lateral: 13.2 LV SV:         78 LV SV Index:   38 LVOT Area:     3.80 cm  RIGHT VENTRICLE RV S prime:     10.40 cm/s TAPSE (M-mode): 2.2 cm LEFT ATRIUM             Index        RIGHT ATRIUM           Index LA diam:        4.20 cm 2.04 cm/m   RA Area:     13.00 cm LA Vol (A2C):   56.0 ml 27.19 ml/m  RA Volume:   26.90 ml  13.06 ml/m LA Vol (A4C):   53.3 ml 25.88 ml/m LA Biplane Vol: 55.1 ml 26.75 ml/m  AORTIC VALVE LVOT Vmax:   95.30 cm/s LVOT Vmean:  71.700 cm/s LVOT VTI:    0.204 m  AORTA Ao Root diam: 3.80 cm Ao Asc diam:  4.70 cm MITRAL VALVE MV Area (PHT): 2.05 cm     SHUNTS MV Decel Time: 370 msec     Systemic VTI:  0.20 m MV E velocity: 71.90 cm/s   Systemic Diam: 2.20 cm MV A velocity: 105.00 cm/s MV E/A ratio:   0.68 Lennie Odor MD Electronically signed by Lennie Odor MD Signature Date/Time: 08/23/2022/4:06:58 PM    Final (Updated)     Vitals:   08/23/22 0858 08/23/22 1254 08/23/22 1700 08/24/22 1219  BP: (!) 146/76 (!) 136/58 (!) 143/66 114/62  Pulse: 78 87 87 (!) 101  Resp: 20 18 20 20   Temp:  98.5 F (36.9 C)  98.7 F (37.1 C)  TempSrc: Oral Oral Oral Oral  SpO2:      Weight:      Height:         PHYSICAL EXAM General:  Alert, well-nourished, well-developed pleasant elderly Caucasian lady in no acute distress Psych:  Mood and affect appropriate for situation CV: Regular rate and rhythm on monitor Respiratory:  Regular, unlabored respirations on room air GI: Abdomen soft and nontender   NEURO:  Mental Status: AA&Ox3, patient is able to give clear and coherent history Speech/Language: speech is without dysarthria or aphasia.  Naming, repetition, fluency, and comprehension intact.  Cranial Nerves:  II: PERRL. Visual fields full.  III, IV, VI: EOMI. Eyelids elevate symmetrically.  V: Sensation is intact to light touch and symmetrical to face.  VII: Face is symmetrical resting and smiling VIII: hearing intact to voice. IX, X: Palate elevates symmetrically. Phonation is normal.  DG:LOVFIEPP shrug 5/5. XII: tongue is midline without fasciculations. Motor: 5/5 strength to all muscle groups tested.  Tone: is normal and bulk is normal Sensation- Intact to light touch bilaterally. Extinction absent to light touch to DSS.   Coordination: FTN intact bilaterally, HKS: no ataxia in BLE.No drift.  Gait- deferred   ASSESSMENT/PLAN  Acute Ischemic Infarct:  Right occipital and cerebellum infarcts Etiology: Severe basilar artery stenosis likely from atherosclerosis.  Doubt dissection Code Stroke CT head No acute abnormality.  CTA head & neck Severe (nearly occlusive) basilar artery stenosis with an appearance that could  reflect dissection (particularly given significant progression since  2015 MRI), although atherosclerotic disease could have a similar appearance. Moderate right M1 MCA stenosis. Approximately 50% stenosis of the proximal right ICA in the neck. MRI   cluster of small acute infarcts in the right occipital lobe and cerebellum  MRA  severe stenosis of the mid basilar artery with proximal tapering, suspicious for dissection versus thrombus.  Carotid Doppler  1-39% stenosis bilaterally 2D Echo echo ejection 65 to 70%.  Left atrium size normal LDL 182 HgbA1c 5.8 VTE prophylaxis - SCDs No antithrombotic prior to admission, now on aspirin 81 mg daily and clopidogrel 75 mg daily Consider brilinta if she is able to afford Therapy recommendations:  CIR Disposition:  pending   Hypertension Stable Blood Pressure Goal: BP less than 220/110   Hyperlipidemia Home meds: Rosuvastatin - resumed.  Patient has history of statin intolerance LDL 182, goal < 70 Consider Leqvio High intensity statin not indicated  Continue statin at discharge  Other Stroke Risk Factors Obesity, Body mass index is 36.61 kg/m., BMI >/= 30 associated with increased stroke risk, recommend weight loss, diet and exercise as appropriate   Other Active Problems Hypothyroidism Fibromyalgia GERD Liver cirrhosis 5.3 cm ascending aortic aneurysm Depression  Hospital day # 3  Patient presented with dizziness and gait imbalance secondary to right superior cerebellar artery infarct secondary to severe basilar artery stenosis likely from atherosclerosis.  Recommend dual antiplatelet therapy and aggressive risk factor modification.  Avoid hypotension and mild permissive hypertension.  Patient is statin intolerant may need to consider switching to injectables like Leqvio.  Mobilize out of bed.  Inpatient rehab recommended by therapy.  Will defer angioplasty stenting as patient as not been on aggressive medical therapy yet.  Long discussion with patient and Dr Thedore Mins and answered questions.  Greater than 50%  time during this 35-minute visit was spent in counseling and coordination of care about her cerebellar stroke and symptomatic severe basilar artery stenosis and discussion about treatment options including maximal medical therapy and elective angioplasty stenting and answering questions. Delia Heady, MD Medical Director [facility] Pager: [phone] 08/24/2022 2:42 PM   To contact Stroke Continuity provider, please refer to [email or web address]. After hours, contact General Neurology

## 2022-08-24 NOTE — Progress Notes (Signed)
Mobility Specialist Progress Note    08/24/22 1212  Mobility  Activity Ambulated with assistance in hallway  Level of Assistance Minimal assist, patient does 75% or more  Assistive Device Front wheel walker  Distance Ambulated (ft) 120 ft  Activity Response Tolerated well  Mobility Referral Yes  $Mobility charge 1 Mobility  Mobility Specialist Start Time (ACUTE ONLY) 1154  Mobility Specialist Stop Time (ACUTE ONLY) 1212  Mobility Specialist Time Calculation (min) (ACUTE ONLY) 18 min   During Mobility: 109 HR  Pt received in bed and agreeable. No complaints on walk. Returned to bed with call bell in reach.   [place] Mobility Specialist  Please Neurosurgeon or Rehab Office at [phone]

## 2022-08-25 DIAGNOSIS — I651 Occlusion and stenosis of basilar artery: Secondary | ICD-10-CM

## 2022-08-25 LAB — HEMOGLOBIN A1C
Hgb A1c MFr Bld: 5.9 % — ABNORMAL HIGH (ref 4.8–5.6)
Hgb A1c MFr Bld: 6.3 % — ABNORMAL HIGH (ref 4.8–5.6)
Mean Plasma Glucose: 123 mg/dL
Mean Plasma Glucose: 134 mg/dL

## 2022-08-25 NOTE — Progress Notes (Signed)
Occupational Therapy Treatment Patient Details Name: Jenny Acosta MRN: [id number] DOB: July 31, 1945 Today's Date: 08/25/2022   History of present illness Pt is 77 yo female admitted on 08/21/22 with CAP, vertigo, and found to have acute cerebellum stroke/R PCA distribution.  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  Pt with hx including but not limited to carotid stenosis, HTN, HLD, GERD, hypothyroidism   OT comments  Pt demonstrating good progress toward therapy goals. Pt presents this session with continued reports of dizziness, blurred vision, generalized weakness, cognition and decreased balance. Pt progressing with OOB activity this session completing room/bathroom level mobility using RW with mod-max cues for managing RW and navigating environment. Pt completed grooming tasks while standing sinkside tolerating standing for approx 5 minutes. Pt educated on importance of utilizing bathroom toilet instead of BSC to maximize OOB activity with staff present, pt verbalized understanding. Pt would benefit from continued acute OT services to maximize functional independence and facilitate transition to intensive inpatient follow up therapy, >3 hours/day after discharge.    Recommendations for follow up therapy are one component of a multi-disciplinary discharge planning process, led by the attending physician.  Recommendations may be updated based on patient status, additional functional criteria and insurance authorization.    Assistance Recommended at Discharge Frequent or constant Supervision/Assistance  Patient can return home with the following  A little help with walking and/or transfers;A little help with bathing/dressing/bathroom;Assistance with cooking/housework;Assist for transportation;Help with stairs or ramp for entrance   Equipment Recommendations  Other (comment) (defer)    Recommendations for Other Services      Precautions / Restrictions Precautions Precautions:  Fall Restrictions Weight Bearing Restrictions: No       Mobility Bed Mobility Overal bed mobility: Needs Assistance Bed Mobility: Supine to Sit, Sit to Supine     Supine to sit: Min guard, HOB elevated Sit to supine: Min guard, HOB elevated   General bed mobility comments: HOB elevated, increased time, min cues for safety with lines/leads    Transfers Overall transfer level: Needs assistance Equipment used: Rolling walker (2 wheels) Transfers: Sit to/from Stand Sit to Stand: Min assist           General transfer comment: STS from EOB using RW with min A and min verbal cues for hand placement. Min guard-min A for room level mobility with mod-max cues for RW management and navigating environment.     Balance Overall balance assessment: Needs assistance Sitting-balance support: No upper extremity supported, Feet supported Sitting balance-Leahy Scale: Good Sitting balance - Comments: sitting EOB   Standing balance support: Bilateral upper extremity supported, Reliant on assistive device for balance, During functional activity Standing balance-Leahy Scale: Poor Standing balance comment: reliant on RW                           ADL either performed or assessed with clinical judgement   ADL Overall ADL's : Needs assistance/impaired     Grooming: Wash/dry face;Oral care;Min guard;Standing Grooming Details (indicate cue type and reason): washed hands/face while standing sinkside with BUEs offloaded from RW, min guard A for stability                 Toilet Transfer: Minimal assistance;Ambulation;Comfort height toilet;Rolling walker (2 wheels) Toilet Transfer Details (indicate cue type and reason): BSC placed over toilet Toileting- Clothing Manipulation and Hygiene: Min guard;Sit to/from stand Toileting - Clothing Manipulation Details (indicate cue type and reason): min guard A for stability in  standing during pericare     Functional mobility during ADLs:  Minimal assistance;Rolling walker (2 wheels) General ADL Comments: Limited secondary to dizziness, fatigue, and generalized weakness    Extremity/Trunk Assessment Upper Extremity Assessment Upper Extremity Assessment: Generalized weakness   Lower Extremity Assessment Lower Extremity Assessment: Defer to PT evaluation        Vision   Vision Assessment?: Vision impaired- to be further tested in functional context Additional Comments: Pt with continued reports of blurred vision and dizziness   Perception Perception Perception: Not tested   Praxis Praxis Praxis: Not tested    Cognition Arousal/Alertness: Awake/alert Behavior During Therapy: WFL for tasks assessed/performed Overall Cognitive Status: No family/caregiver present to determine baseline cognitive functioning                                 General Comments: Pleasant and cooperative, benefits from simple 1 step commands, mod-max cues required for obstacle navigation using RW        Exercises      Shoulder Instructions       General Comments VSS on RA    Pertinent Vitals/ Pain       Pain Assessment Pain Assessment: No/denies pain  Home Living Family/patient expects to be discharged to:: Private residence Living Arrangements: Alone Available Help at Discharge: Family;Available 24 hours/day Type of Home: Independent living facility Home Access: Level entry     Home Layout: One level     Bathroom Shower/Tub: Producer, television/film/video: Handicapped height Bathroom Accessibility: Yes How Accessible: Accessible via wheelchair;Accessible via walker Home Equipment: Rollator (4 wheels);Cane - single point;Shower seat - built in;Grab bars - tub/shower   Additional Comments: If goes to daughter's she has 4-5 steps to enter.      Prior Functioning/Environment              Frequency  Min 2X/week        Progress Toward Goals  OT Goals(current goals can now be found in the  care plan section)  Progress towards OT goals: Progressing toward goals  Acute Rehab OT Goals Patient Stated Goal: to brush teeth OT Goal Formulation: With patient Time For Goal Achievement: 09/06/22 Potential to Achieve Goals: Good ADL Goals Pt Will Perform Grooming: with modified independence;standing Pt Will Perform Lower Body Dressing: with modified independence;sit to/from stand Pt Will Transfer to Toilet: with modified independence;ambulating;regular height toilet Additional ADL Goal #1: Pt will independently navigate hospital environment with no safety concerns  Plan Discharge plan remains appropriate;Frequency remains appropriate    Co-evaluation                 AM-PAC OT "6 Clicks" Daily Activity     Outcome Measure   Help from another person eating meals?: None Help from another person taking care of personal grooming?: A Little Help from another person toileting, which includes using toliet, bedpan, or urinal?: A Little Help from another person bathing (including washing, rinsing, drying)?: A Little Help from another person to put on and taking off regular upper body clothing?: A Little Help from another person to put on and taking off regular lower body clothing?: A Little 6 Click Score: 19    End of Session Equipment Utilized During Treatment: Gait belt;Rolling walker (2 wheels)  OT Visit Diagnosis: Unsteadiness on feet (R26.81);Muscle weakness (generalized) (M62.81);History of falling (Z91.81);Pain;Dizziness and giddiness (R42)   Activity Tolerance Patient tolerated treatment well   Patient  Left in bed;with call bell/phone within reach;with bed alarm set   Nurse Communication Mobility status        Time: 1425-1445 OT Time Calculation (min): 20 min  Charges: OT General Charges $OT Visit: 1 Visit OT Treatments $Self Care/Home Management : 8-22 mins Sherley Bounds, OTS Acute Rehabilitation Services Office [phone] Secure Chat Communication  Preferred   Sherley Bounds 08/25/2022, 3:18 PM

## 2022-08-25 NOTE — Care Management Important Message (Signed)
Important Message  Patient Details  Name: Jenny Acosta MRN: [id number] Date of Birth: 1946-02-05   Medicare Important Message Given:  Yes     Mikita Lesmeister Stefan Church 08/25/2022, 3:06 PM

## 2022-08-25 NOTE — Progress Notes (Signed)
PROGRESS NOTE        PATIENT DETAILS Name: Jenny Acosta Age: 77 y.o. Sex: female Date of Birth: 1945-06-20 Admit Date: 08/21/2022 Admitting Physician Angie Fava, DO ZOX:WRUE, Len Blalock, MD  Brief Summary: Patient is a 77 y.o.  female with history of HTN, HLD, hypothyroidism who presented with vertigo, nausea, vomiting-found to have acute CVA and probable aspiration pneumonia.  Significant events: 6/27>> admit to [facility] 6/28>> transferred to [facility]  Significant studies: 6/27>> CT head: No acute abnormalities 6/27>> CT C-spine: No acute cervical spine fracture.  Possible acute fracture of left T2 transverse process. 6/27>> CT angio chest/abdomen/pelvis dissection protocol: No acute intrathoracic/abdominal/pelvic pathology.  5.3 cm ascending aortic aneurysm. 6/28>> MRI brain: Acute infarct right cerebellum and mildly scattered right PCA territory. 6/28>> MRA brain: Severe stenosis of mid basilar artery with proximal tapering-dissection versus thrombus. 6/28>> CT angio head/neck: Severe basilar artery stenosis with appearance that could reflect dissection.  50% stenosis of proximal right ICA. 6/28>> bilateral carotid artery Doppler: 1-39% stenosis bilaterally. 6/29>> LDL: 182 6/29>> A1c: Pending  Significant microbiology data: 6/27>> COVID/influenza/RSV PCR: Negative  Procedures: None  Consults: Neurology  Subjective:   Patient in bed, appears comfortable, denies any headache, no fever, no chest pain or pressure, no shortness of breath , no abdominal pain. No new focal weakness.  Objective: Vitals: Blood pressure (!) 151/95, pulse 87, temperature 98.3 F (36.8 C), temperature source Oral, resp. rate 20, height 5\' 5"  (1.651 m), weight 107.5 kg, SpO2 93 %.   Exam:  Awake Alert, No new F.N deficits, Normal affect [place].AT,PERRAL Supple Neck, No JVD,   Symmetrical Chest wall movement, Good air movement bilaterally, CTAB RRR,No Gallops, Rubs or  new Murmurs,  +ve B.Sounds, Abd Soft, No tenderness,   No Cyanosis, Clubbing or edema    Assessment/Plan:  Acute CVA involving right cerebellum/scattered PCA territory infarct Severe basilar artery stenosis versus dissection This discussed in detail with patient and the stroke team Dr. Pearlean Brownie on 08/24/2022, plan is for DAPT for 3 months thereafter aspirin, continue full dose Crestor add Zetia for better LDL control, patient's basilar artery is most likely atherosclerotic, for now patient and family do not want any invasive procedures.  Plan is follow-up with neurology in the next 4 to 6 weeks and then consider if stenting procedure is required which in turn is quite high risk.  For now continue supportive care for stroke, she awaits CIR bed.  Stable A1c.  Aspiration pneumonia Presumed aspiration pneumonia in the setting of vomiting due to vertigo/CVA On Rocephin and azithromycin, switch to Augmentin for 2 more days, clinically pneumonia resolved Speech therapy following.  HLD LDL significantly elevated Add Zetia Probably will need Repatha injections as an outpatient  Hypothyroidism Synthroid TSH stable  GERD PPI  Depression Paxil  Liver cirrhosis Seen incidentally on CT abdomen-will need further workup in the outpatient setting-currently asymptomatic  Possible acute fracture of left T2 transverse process.  Seen incidentally on neuroimaging Obtain CT T spine to confirm  5.3 cm ascending aortic aneurysm Radiology recommending semiannual imaging by CTA or MRA and outpatient cardiothoracic follow-up.  3 mm focal subpleural nodular consolidation right middle lobe No symptoms currently Repeat imaging in 4 to 6 weeks  Obesity: Estimated body mass index is 39.44 kg/m as calculated from the following:   Height as of this encounter: 5\' 5"  (1.651 m).  Weight as of this encounter: 107.5 kg.   Code status:   Code Status: Full Code   DVT Prophylaxis: heparin injection 5,000  Units Start: 08/24/22 0945 SCDs Start: 08/21/22 2110 Heparin  Family Communication: None at bedside   Disposition Plan: Status is: Inpatient Remains inpatient appropriate because: Severity of illness   Planned Discharge Destination:Rehabilitation facility   Diet: Diet Order             Diet regular Room service appropriate? Yes; Fluid consistency: Thin  Diet effective now                    MEDICATIONS: Scheduled Meds:  amoxicillin-clavulanate  1 tablet Oral Q12H   aspirin  81 mg Oral Daily   clopidogrel  75 mg Oral Daily   ezetimibe  10 mg Oral Daily   guaiFENesin  600 mg Oral BID   heparin injection (subcutaneous)  5,000 Units Subcutaneous Q8H   levothyroxine  100 mcg Oral Q0600   loratadine  10 mg Oral Daily   nortriptyline  10 mg Oral BID   pantoprazole  40 mg Oral Daily   PARoxetine  30 mg Oral Daily   polyethylene glycol  17 g Oral Daily   rosuvastatin  40 mg Oral Daily   Continuous Infusions:   PRN Meds:.acetaminophen **OR** acetaminophen, benzonatate, meclizine, melatonin, ondansetron (ZOFRAN) IV, traMADol   I have personally reviewed following labs and imaging studies  LABORATORY DATA:  Recent Labs  Lab 08/21/22 1314 08/22/22 0113  WBC 9.9 9.8  HGB 15.1* 15.2*  HCT 47.6* 47.5*  PLT 238 253  MCV 89.1 88.5  MCH 28.3 28.3  MCHC 31.7 32.0  RDW 14.5 14.3  LYMPHSABS 2.3 3.3  MONOABS 0.7 1.0  EOSABS 0.1 0.0  BASOSABS 0.0 0.0    Recent Labs  Lab 08/21/22 1314 08/21/22 1329 08/22/22 0113  NA 140  --  136  K 3.6  --  3.7  CL 101  --  101  CO2 26  --  23  ANIONGAP 13  --  12  GLUCOSE 125*  --  110*  BUN 14  --  10  CREATININE 0.80  --  0.58  AST 28  --  29  ALT 27  --  27  ALKPHOS 118  --  116  BILITOT 0.6  --  0.7  ALBUMIN 4.1  --  4.0  PROCALCITON  --   --  <0.10  TSH  --   --  0.538  AMMONIA  --  13  --   MG 2.3  --  2.4  CALCIUM 9.1  --  8.9    Lab Results  Component Value Date   CHOL 131 08/23/2022   HDL 60  08/23/2022   LDLCALC 66 08/23/2022   LDLDIRECT 121.0 06/17/2022   TRIG 24 08/23/2022   CHOLHDL 2.2 08/23/2022    Lab Results  Component Value Date   HGBA1C 5.8 06/17/2022     RADIOLOGY STUDIES/RESULTS: CT THORACIC SPINE WO CONTRAST  Result Date: 08/23/2022 CLINICAL DATA:  Possible left L2 transverse process fracture on MR EXAM: CT THORACIC SPINE WITHOUT CONTRAST TECHNIQUE: Multidetector CT images of the thoracic were obtained using the standard protocol without intravenous contrast. RADIATION DOSE REDUCTION: This exam was performed according to the departmental dose-optimization program which includes automated exposure control, adjustment of the mA and/or kV according to patient size and/or use of iterative reconstruction technique. COMPARISON:  CTA chest dated 08/21/2022 FINDINGS: Alignment: Mild reverse S-shaped thoracic scoliosis.  Normal thoracic kyphosis. Vertebrae: No acute fracture or focal pathologic process. Specifically, the left T2 transverse process is intact. Paraspinal and other soft tissues: Better evaluated on recent CTA chest. Atherosclerotic calcifications. Prior cholecystectomy. Disc levels: Mild multilevel degenerative changes. Spinal canal is patent. IMPRESSION: No fracture is seen. Specifically, the left T2 transverse process is intact. Electronically Signed   By: Charline Bills M.D.   On: 08/23/2022 20:25   ECHOCARDIOGRAM COMPLETE  Result Date: 08/23/2022    ECHOCARDIOGRAM REPORT   Patient Name:   Jenny Acosta Date of Exam: 08/23/2022 Medical Rec #:  [id number]      Height:       65.0 in Accession #:    [id number]     Weight:       220.0 lb Date of Birth:  [birth year]      BSA:          2.060 m Patient Age:    77 years       BP:           135/58 mmHg Patient Gender: F              HR:           73 bpm. Exam Location:  Inpatient Procedure: 2D Echo, Color Doppler and Cardiac Doppler Indications:     Stroke i63.9  History:         Patient has prior history of Echocardiogram  examinations, most                  recent 10/17/2020. Risk Factors:Hypertension and Dyslipidemia.  Sonographer:     Irving Burton Senior RDCS Referring Phys:  [id number] Alba Cory Diagnosing Phys: Lennie Odor MD  Sonographer Comments: Technically difficult due to poor echo windows. IMPRESSIONS  1. Left ventricular ejection fraction, by estimation, is 65 to 70%. The left ventricle has normal function. The left ventricle has no regional wall motion abnormalities. Left ventricular diastolic parameters are consistent with Grade I diastolic dysfunction (impaired relaxation).  2. Right ventricular systolic function is normal. The right ventricular size is normal. Tricuspid regurgitation signal is inadequate for assessing PA pressure.  3. The mitral valve is normal in structure. Trivial mitral valve regurgitation.  4. The aortic valve was not well visualized. Aortic valve regurgitation is not visualized.  5. Ascending aortic aneurysm up to 48 mm. 53 mm on recent CT imaging. Aortic dilatation noted. Aneurysm of the ascending aorta, measuring 48 mm.  6. The inferior vena cava is normal in size with greater than 50% respiratory variability, suggesting right atrial pressure of 3 mmHg. Conclusion(s)/Recommendation(s): No intracardiac source of embolism detected on this transthoracic study. Consider a transesophageal echocardiogram to exclude cardiac source of embolism if clinically indicated. FINDINGS  Left Ventricle: Left ventricular ejection fraction, by estimation, is 65 to 70%. The left ventricle has normal function. The left ventricle has no regional wall motion abnormalities. The left ventricular internal cavity size was normal in size. There is  no left ventricular hypertrophy. Left ventricular diastolic parameters are consistent with Grade I diastolic dysfunction (impaired relaxation). Right Ventricle: The right ventricular size is normal. No increase in right ventricular wall thickness. Right ventricular systolic function  is normal. Tricuspid regurgitation signal is inadequate for assessing PA pressure. Left Atrium: Left atrial size was normal in size. Right Atrium: Right atrial size was normal in size. Pericardium: Trivial pericardial effusion is present. Presence of epicardial fat layer. Mitral Valve: The mitral valve is normal in structure. Trivial mitral valve regurgitation.  Tricuspid Valve: The tricuspid valve is grossly normal. Tricuspid valve regurgitation is not demonstrated. No evidence of tricuspid stenosis. Aortic Valve: The aortic valve was not well visualized. Aortic valve regurgitation is not visualized. Pulmonic Valve: The pulmonic valve was grossly normal. Pulmonic valve regurgitation is not visualized. No evidence of pulmonic stenosis. Aorta: Ascending aortic aneurysm up to 48 mm. 53 mm on recent CT imaging. Aortic dilatation noted. There is an aneurysm involving the ascending aorta measuring 48 mm. Venous: The inferior vena cava is normal in size with greater than 50% respiratory variability, suggesting right atrial pressure of 3 mmHg. IAS/Shunts: The atrial septum is grossly normal.  LEFT VENTRICLE PLAX 2D LVIDd:         4.20 cm   Diastology LVIDs:         2.70 cm   LV e' medial:    4.57 cm/s LV PW:         1.00 cm   LV E/e' medial:  15.7 LV IVS:        0.90 cm   LV e' lateral:   5.44 cm/s LVOT diam:     2.20 cm   LV E/e' lateral: 13.2 LV SV:         78 LV SV Index:   38 LVOT Area:     3.80 cm  RIGHT VENTRICLE RV S prime:     10.40 cm/s TAPSE (M-mode): 2.2 cm LEFT ATRIUM             Index        RIGHT ATRIUM           Index LA diam:        4.20 cm 2.04 cm/m   RA Area:     13.00 cm LA Vol (A2C):   56.0 ml 27.19 ml/m  RA Volume:   26.90 ml  13.06 ml/m LA Vol (A4C):   53.3 ml 25.88 ml/m LA Biplane Vol: 55.1 ml 26.75 ml/m  AORTIC VALVE LVOT Vmax:   95.30 cm/s LVOT Vmean:  71.700 cm/s LVOT VTI:    0.204 m  AORTA Ao Root diam: 3.80 cm Ao Asc diam:  4.70 cm MITRAL VALVE MV Area (PHT): 2.05 cm     SHUNTS MV Decel  Time: 370 msec     Systemic VTI:  0.20 m MV E velocity: 71.90 cm/s   Systemic Diam: 2.20 cm MV A velocity: 105.00 cm/s MV E/A ratio:  0.68 Lennie Odor MD Electronically signed by Lennie Odor MD Signature Date/Time: 08/23/2022/4:06:58 PM    Final (Updated)      LOS: 4 days   Signature  -    Susa Raring M.D on 08/25/2022 at 8:27 AM   -  To page go to [email or web address]

## 2022-08-25 NOTE — Progress Notes (Addendum)
Inpatient Rehab Admissions Coordinator:  Saw pt at bedside. Informed her that starting insurance authorization. Will continue to follow.  1251: Received insurance authorization. Await bed availability.    Wolfgang Phoenix, MS, CCC-SLP Admissions Coordinator [phone]

## 2022-08-25 NOTE — Progress Notes (Signed)
STROKE TEAM PROGRESS NOTE   BRIEF HPI [name] is a 77 y.o. female with history of carotid stenosis, HTN, HLD, GERD, hypothyroidism, who presented to [facility] ED from her facility for weakness, dizziness and sustaining a fall.  presenting 6/27 after falling. She reports double vision, dizziness.  She may additionally have some aspiration pneumonia which is being managed by the primary team.   SIGNIFICANT HOSPITAL EVENTS 6/27- admitted  6/29-- loaded with plavix 6/28-MRI right superior cerebellar artery infarct.  Small scattered right PCA infarcts 6/28-CT angiogram severe near occlusive basilar artery stenosis.  Moderate right M1 stenosis.  50% proximal right ICA stenosis in the neck.   INTERM HISTORY/SUBJECTIVE Patient is lying in bed comfortably..  She is doing better and is able to ambulate with physical therapy.  Less dizziness today.  She is awaiting inpatient rehab.  She is interested in considering to participate in the [facility] stroke prevention study OBJECTIVE  CBC    Component Value Date/Time   WBC 9.8 08/22/2022 0113   RBC 5.37 (H) 08/22/2022 0113   HGB 15.2 (H) 08/22/2022 0113   HGB 14.8 12/03/2013 2155   HCT 47.5 (H) 08/22/2022 0113   HCT 46.7 12/03/2013 2155   PLT 253 08/22/2022 0113   PLT 275 12/03/2013 2155   MCV 88.5 08/22/2022 0113   MCV 89 12/03/2013 2155   MCH 28.3 08/22/2022 0113   MCHC 32.0 08/22/2022 0113   RDW 14.3 08/22/2022 0113   RDW 14.1 12/03/2013 2155   LYMPHSABS 3.3 08/22/2022 0113   MONOABS 1.0 08/22/2022 0113   EOSABS 0.0 08/22/2022 0113   BASOSABS 0.0 08/22/2022 0113    BMET    Component Value Date/Time   NA 136 08/22/2022 0113   NA 142 12/03/2013 2155   K 3.7 08/22/2022 0113   K 3.7 12/03/2013 2155   CL 101 08/22/2022 0113   CL 109 (H) 12/03/2013 2155   CO2 23 08/22/2022 0113   CO2 25 12/03/2013 2155   GLUCOSE 110 (H) 08/22/2022 0113   GLUCOSE 106 (H) 12/03/2013 2155   BUN 10 08/22/2022 0113   BUN 10 12/03/2013 2155    CREATININE 0.58 08/22/2022 0113   CREATININE 0.71 12/03/2013 2155   CALCIUM 8.9 08/22/2022 0113   CALCIUM 8.7 12/03/2013 2155   GFRNONAA >60 08/22/2022 0113   GFRNONAA >60 12/03/2013 2155    IMAGING past 24 hours No results found.  Vitals:   08/25/22 0400 08/25/22 0500 08/25/22 0739 08/25/22 1200  BP: (!) 144/90  (!) 151/95 (!) 144/86  Pulse: 87   90  Resp: 20   19  Temp: 98.5 F (36.9 C)  98.3 F (36.8 C) 98.4 F (36.9 C)  TempSrc: Oral  Oral Oral  SpO2: 93%     Weight:  107.5 kg    Height:         PHYSICAL EXAM General:  Alert, well-nourished, well-developed pleasant elderly Caucasian lady in no acute distress Psych:  Mood and affect appropriate for situation CV: Regular rate and rhythm on monitor Respiratory:  Regular, unlabored respirations on room air GI: Abdomen soft and nontender   NEURO:  Mental Status: AA&Ox3, patient is able to give clear and coherent history Speech/Language: speech is without dysarthria or aphasia.  Naming, repetition, fluency, and comprehension intact.  Cranial Nerves:  II: PERRL. Visual fields full.  III, IV, VI: EOMI. Eyelids elevate symmetrically.  V: Sensation is intact to light touch and symmetrical to face.  VII: Face is symmetrical resting and smiling VIII: hearing  intact to voice. IX, X: Palate elevates symmetrically. Phonation is normal.  ZO:[id number] shrug 5/5. XII: tongue is midline without fasciculations. Motor: 5/5 strength to all muscle groups tested.  Tone: is normal and bulk is normal Sensation- Intact to light touch bilaterally. Extinction absent to light touch to DSS.   Coordination: FTN intact bilaterally, HKS: no ataxia in BLE.No drift.  Gait- deferred   ASSESSMENT/PLAN  Acute Ischemic Infarct:  Right occipital and cerebellum infarcts Etiology: Severe basilar artery stenosis likely from atherosclerosis.  Doubt dissection Code Stroke CT head No acute abnormality.  CTA head & neck Severe (nearly occlusive)  basilar artery stenosis with an appearance that could reflect dissection (particularly given significant progression since 2015 MRI), although atherosclerotic disease could have a similar appearance. Moderate right M1 MCA stenosis. Approximately 50% stenosis of the proximal right ICA in the neck. MRI   cluster of small acute infarcts in the right occipital lobe and cerebellum  MRA  severe stenosis of the mid basilar artery with proximal tapering, suspicious for dissection versus thrombus.  Carotid Doppler  1-39% stenosis bilaterally 2D Echo echo ejection 65 to 70%.  Left atrium size normal LDL 182 HgbA1c 5.8 VTE prophylaxis - SCDs No antithrombotic prior to admission, now on aspirin 81 mg daily and clopidogrel 75 mg daily Consider brilinta if she is able to afford Therapy recommendations:  CIR Disposition:  pending   Hypertension Stable Blood Pressure Goal: BP less than 220/110   Hyperlipidemia Home meds: Rosuvastatin - resumed.  Patient has history of statin intolerance LDL 182, goal < 70 Consider Leqvio High intensity statin not indicated  Continue statin at discharge  Other Stroke Risk Factors Obesity, Body mass index is 39.44 kg/m., BMI >/= 30 associated with increased stroke risk, recommend weight loss, diet and exercise as appropriate   Other Active Problems Hypothyroidism Fibromyalgia GERD Liver cirrhosis 5.3 cm ascending aortic aneurysm Depression  Hospital day # 4  Continue to mobilize out of bed.  Continue aspirin and Plavix and aggressive risk factor modification.  Continue ongoing therapies.  Transfer to inpatient rehab when bed available.  Patient given written information to review about [facility] stroke prevention study and let us know if she wants to participate.  She was told clearly that she has no obligation to participate and that her participation is voluntary and she can change her mind anytime while in the study if she is not satisfied.  long discussion with  patient and Dr Thedore Mins and answered questions.  Greater than 50% time during this 35-minute visit was spent in counseling and coordination of care about her cerebellar stroke and symptomatic severe basilar artery stenosis and discussion about treatment options including maximal medical therapy and elective angioplasty stenting and answering questions. Delia Heady, MD Medical Director [facility] Pager: [phone] 08/25/2022 2:26 PM   To contact Stroke Continuity provider, please refer to [email or web address]. After hours, contact General Neurology

## 2022-08-25 NOTE — Progress Notes (Signed)
Physical Therapy Treatment Patient Details Name: Jenny Acosta MRN: [id number] DOB: Aug 13, 1945 Today's Date: 08/25/2022   History of Present Illness Pt is 77 yo female admitted on 08/21/22 with CAP, vertigo, and found to have acute cerebellum stroke/R PCA distribution.  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  Pt with hx including but not limited to carotid stenosis, HTN, HLD, GERD, hypothyroidism    PT Comments  Pt tolerated today's session well, able to progress ambulation in the hallway with only 2 brief standing rest breaks due to mild fatigue and regaining balance, vitals stable throughout. Pt with mild left veer with ambulation but able to correct with intermittent cues. Pt with one mild LOB, able to self-correct, with 90 degree turn, cued for decreasing speed and performing segmental turning to assist. Discharge recommendations remain appropriate, acute PT will continue to follow during admission to maximize safety and mobility.     Assistance Recommended at Discharge Frequent or constant Supervision/Assistance  If plan is discharge home, recommend the following:  Can travel by private vehicle    A little help with walking and/or transfers;A little help with bathing/dressing/bathroom;Assistance with cooking/housework;Help with stairs or ramp for entrance;Assist for transportation      Equipment Recommendations  Rolling walker (2 wheels)    Recommendations for Other Services       Precautions / Restrictions Precautions Precautions: Fall Restrictions Weight Bearing Restrictions: No     Mobility  Bed Mobility Overal bed mobility: Needs Assistance Bed Mobility: Supine to Sit     Supine to sit: HOB elevated, Min assist Sit to supine: Min guard   General bed mobility comments: minA to pull trunk into sitting, able to return to supine with close guard for safety and increased time    Transfers Overall transfer level: Needs assistance Equipment used: Rolling  walker (2 wheels) Transfers: Sit to/from Stand Sit to Stand: Min guard           General transfer comment: minG for safety and balance with sit>stand, cueing for proper hand placement, improving with repetition    Ambulation/Gait Ambulation/Gait assistance: Min guard Gait Distance (Feet): 150 Feet (with ~2 brief standing rest breaks) Assistive device: Rolling walker (2 wheels) Gait Pattern/deviations: Decreased stride length, Trunk flexed, Wide base of support, Step-through pattern, Staggering left Gait velocity: decreased     General Gait Details: minG for safety with cueing for RW management and obstacle negotiation. Noted mild left veer, pt able to correct. Pt reports double vision, glasses at bedside with tape applied, used for a majority of ambulation but then pt requesting to trial without, noted one mild LOB but pt able to self-correct. Cueing for taking her time and performing segmental turns   Optometrist     Tilt Bed    Modified Rankin (Stroke Patients Only) Modified Rankin (Stroke Patients Only) Pre-Morbid Rankin Score: No significant disability Modified Rankin: Moderately severe disability     Balance Overall balance assessment: Needs assistance Sitting-balance support: No upper extremity supported, Feet supported Sitting balance-Leahy Scale: Good     Standing balance support: Bilateral upper extremity supported, Reliant on assistive device for balance, During functional activity Standing balance-Leahy Scale: Poor Standing balance comment: reliant on RW                            Cognition Arousal/Alertness: Awake/alert Behavior During Therapy: WFL for tasks assessed/performed Overall Cognitive  Status: Within Functional Limits for tasks assessed                                 General Comments: pleasant during session, HOH and requiring increased time and cueing for command following         Exercises Other Exercises Other Exercises: sit<>stand x5 reps from bed    General Comments General comments (skin integrity, edema, etc.): VSS on room air, BP 138/93 seated EOB after ambulation. Pt reports baseline blurry vision and intermittent double vision but reports overall improved from admission      Pertinent Vitals/Pain Pain Assessment Pain Assessment: No/denies pain    Home Living                          Prior Function            PT Goals (current goals can now be found in the care plan section) Acute Rehab PT Goals Patient Stated Goal: improve balance; reports could stay with daughter for a while PT Goal Formulation: With patient Time For Goal Achievement: 09/05/22 Potential to Achieve Goals: Good Progress towards PT goals: Progressing toward goals    Frequency    Min 4X/week      PT Plan Current plan remains appropriate    Co-evaluation              AM-PAC PT "6 Clicks" Mobility   Outcome Measure  Help needed turning from your back to your side while in a flat bed without using bedrails?: A Little Help needed moving from lying on your back to sitting on the side of a flat bed without using bedrails?: A Little Help needed moving to and from a bed to a chair (including a wheelchair)?: A Little Help needed standing up from a chair using your arms (e.g., wheelchair or bedside chair)?: A Little Help needed to walk in hospital room?: A Little Help needed climbing 3-5 steps with a railing? : Total 6 Click Score: 16    End of Session Equipment Utilized During Treatment: Gait belt Activity Tolerance: Patient tolerated treatment well Patient left: with call bell/phone within reach;in bed Nurse Communication: Mobility status PT Visit Diagnosis: Other abnormalities of gait and mobility (R26.89);Dizziness and giddiness (R42)     Time: 2956-2130 PT Time Calculation (min) (ACUTE ONLY): 21 min  Charges:    $Gait Training: 8-22 mins PT  General Charges $$ ACUTE PT VISIT: 1 Visit                     Lindalou Hose, PT DPT Acute Rehabilitation Services Office [phone]    Leonie Man 08/25/2022, 1:15 PM

## 2022-08-25 NOTE — Plan of Care (Signed)
Pt stroke education provided at this time.

## 2022-08-25 NOTE — TOC Progression Note (Signed)
Transition of Care [facility]) - Progression Note    Patient Details  Name: Jenny Acosta MRN: [id number] Date of Birth: 1945/11/21  Transition of Care [facility]) CM/SW Contact  Mearl Latin, LCSW Phone Number: 08/25/2022, 4:33 PM  Clinical Narrative:    Patient approved for CIR pending bed availability.      Barriers to Discharge: No Barriers Identified  Expected Discharge Plan and Services                         DME Arranged: Wheelchair manual (wheelchair w/ cushioned back and seat) DME Agency: [employer] Date DME Agency Contacted: 08/22/22 Time DME Agency Contacted: 1505 Representative spoke with at DME Agency: Vaughan Basta             Social Determinants of Health (SDOH) Interventions SDOH Screenings   Food Insecurity: No Food Insecurity (08/22/2022)  Housing: Low Risk  (08/22/2022)  Transportation Needs: No Transportation Needs (08/22/2022)  Utilities: Not At Risk (08/22/2022)  Alcohol Screen: Low Risk  (05/27/2021)  Depression (PHQ2-9): High Risk (06/17/2022)  Financial Resource Strain: Low Risk  (05/27/2021)  Physical Activity: Inactive (05/27/2021)  Social Connections: Unknown (05/27/2021)  Stress: No Stress Concern Present (05/27/2021)  Tobacco Use: Low Risk  (08/21/2022)    Readmission Risk Interventions     No data to display

## 2022-08-26 NOTE — Progress Notes (Signed)
Physical Therapy Treatment Patient Details Name: Jenny Acosta MRN: [id number] DOB: 12/12/1945 Today's Date: 08/26/2022   History of Present Illness Pt is 77 yo female admitted on 08/21/22 with CAP, vertigo, and found to have acute cerebellum stroke/R PCA distribution.  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  Pt with hx including but not limited to carotid stenosis, HTN, HLD, GERD, hypothyroidism    PT Comments  Pt tolerated today's session fairly well, remaining limited by dizziness and double vision, noting decreased gait speed and ambulation tolerance today. Pt with ~3 LOB with minA to correct during ambulation, cueing for regaining her balance prior to attempting to continue to ambulate. Continues to require moderate cueing for RW safety around obstacles and with turns. Pt able to perform standing toe touches onto a box to progress B foot clearance and step length as well as single limb balance, BUE support provided with RW today and minG-minA. Discharge recommendations remain appropriate, acute PT will continue to follow to progress mobility.      Assistance Recommended at Discharge Frequent or constant Supervision/Assistance  If plan is discharge home, recommend the following:  Can travel by private vehicle    A little help with walking and/or transfers;A little help with bathing/dressing/bathroom;Assistance with cooking/housework;Help with stairs or ramp for entrance;Assist for transportation      Equipment Recommendations  Rolling walker (2 wheels)    Recommendations for Other Services       Precautions / Restrictions Precautions Precautions: Fall Restrictions Weight Bearing Restrictions: No     Mobility  Bed Mobility Overal bed mobility: Needs Assistance Bed Mobility: Supine to Sit     Supine to sit: Min guard, HOB elevated     General bed mobility comments: HOB elevated, increased time, min cues for safety and technique. Pt seated EOB at end of session     Transfers Overall transfer level: Needs assistance Equipment used: Rolling walker (2 wheels) Transfers: Sit to/from Stand Sit to Stand: Min assist, Min guard           General transfer comment: minA to power up with first stand, progressing to minG with 2 other trials, requiring cueing with each trial for proper hand placement    Ambulation/Gait Ambulation/Gait assistance: Min guard, Min assist Gait Distance (Feet): 60 Feet Assistive device: Rolling walker (2 wheels) Gait Pattern/deviations: Decreased stride length, Trunk flexed, Wide base of support, Step-through pattern, Staggering left, Decreased step length - left, Decreased step length - right, Drifts right/left Gait velocity: decreased     General Gait Details: minG to minA throughout ambulation, noted decrease in gait speed with very cautious gait. Initially with a L veer but intermittent B path deviation, noted ~3 losses of balance with minA provided. Moderate cueing provided for safety with RW management and cueing for turning.   Stairs             Wheelchair Mobility     Tilt Bed    Modified Rankin (Stroke Patients Only) Modified Rankin (Stroke Patients Only) Pre-Morbid Rankin Score: No significant disability Modified Rankin: Moderately severe disability     Balance Overall balance assessment: Needs assistance Sitting-balance support: No upper extremity supported, Feet supported Sitting balance-Leahy Scale: Fair     Standing balance support: Bilateral upper extremity supported, Reliant on assistive device for balance, During functional activity Standing balance-Leahy Scale: Poor Standing balance comment: reliant on RW  Cognition Arousal/Alertness: Awake/alert Behavior During Therapy: WFL for tasks assessed/performed Overall Cognitive Status: No family/caregiver present to determine baseline cognitive functioning                                  General Comments: Pleasant and cooperative, benefits from simple 1 step commands, continues to require mod cueing for RW management around obstacles and with turns        Exercises Other Exercises Other Exercises: standing toe taps onto box, x10 reps BLE with BUE support    General Comments General comments (skin integrity, edema, etc.): HR with brief increase to 120s but quickly returning to 80-90s at EOB. Pt reporting mild dizziness after ambulation, BP 154/93      Pertinent Vitals/Pain Pain Assessment Pain Assessment: Faces Faces Pain Scale: No hurt    Home Living                          Prior Function            PT Goals (current goals can now be found in the care plan section) Acute Rehab PT Goals Patient Stated Goal: improve balance; reports could stay with daughter for a while PT Goal Formulation: With patient Time For Goal Achievement: 09/05/22 Potential to Achieve Goals: Good Progress towards PT goals: Progressing toward goals    Frequency    Min 4X/week      PT Plan Current plan remains appropriate    Co-evaluation              AM-PAC PT "6 Clicks" Mobility   Outcome Measure  Help needed turning from your back to your side while in a flat bed without using bedrails?: A Little Help needed moving from lying on your back to sitting on the side of a flat bed without using bedrails?: A Little Help needed moving to and from a bed to a chair (including a wheelchair)?: A Little Help needed standing up from a chair using your arms (e.g., wheelchair or bedside chair)?: A Little Help needed to walk in hospital room?: A Little Help needed climbing 3-5 steps with a railing? : Total 6 Click Score: 16    End of Session Equipment Utilized During Treatment: Gait belt Activity Tolerance: Patient tolerated treatment well Patient left: with call bell/phone within reach;in bed Nurse Communication: Mobility status PT Visit Diagnosis: Other  abnormalities of gait and mobility (R26.89);Dizziness and giddiness (R42)     Time: 1610-9604 PT Time Calculation (min) (ACUTE ONLY): 22 min  Charges:    $Therapeutic Activity: 8-22 mins PT General Charges $$ ACUTE PT VISIT: 1 Visit                     Lindalou Hose, PT DPT Acute Rehabilitation Services Office [phone]    Leonie Man 08/26/2022, 4:36 PM

## 2022-08-26 NOTE — Progress Notes (Signed)
STROKE TEAM PROGRESS NOTE   BRIEF HPI Jenny Acosta is a 77 y.o. female with history of carotid stenosis, HTN, HLD, GERD, hypothyroidism, who presented to [facility] ED from her facility for weakness, dizziness and sustaining a fall.  presenting 6/27 after falling. She reports double vision, dizziness.  She may additionally have some aspiration pneumonia which is being managed by the primary team.   SIGNIFICANT HOSPITAL EVENTS 6/27- admitted  6/29-- loaded with plavix 6/28-MRI right superior cerebellar artery infarct.  Small scattered right PCA infarcts 6/28-CT angiogram severe near occlusive basilar artery stenosis.  Moderate right M1 stenosis.  50% proximal right ICA stenosis in the neck.   INTERM HISTORY/SUBJECTIVE Patient is lying in bed comfortably..  She is doing better and is able to ambulate with physical therapy.   She is yet awaiting inpatient rehab.  She is interested in considering to participate in the [facility] stroke prevention study OBJECTIVE  CBC    Component Value Date/Time   WBC 9.8 08/22/2022 0113   RBC 5.37 (H) 08/22/2022 0113   HGB 15.2 (H) 08/22/2022 0113   HGB 14.8 12/03/2013 2155   HCT 47.5 (H) 08/22/2022 0113   HCT 46.7 12/03/2013 2155   PLT 253 08/22/2022 0113   PLT 275 12/03/2013 2155   MCV 88.5 08/22/2022 0113   MCV 89 12/03/2013 2155   MCH 28.3 08/22/2022 0113   MCHC 32.0 08/22/2022 0113   RDW 14.3 08/22/2022 0113   RDW 14.1 12/03/2013 2155   LYMPHSABS 3.3 08/22/2022 0113   MONOABS 1.0 08/22/2022 0113   EOSABS 0.0 08/22/2022 0113   BASOSABS 0.0 08/22/2022 0113    BMET    Component Value Date/Time   NA 136 08/22/2022 0113   NA 142 12/03/2013 2155   K 3.7 08/22/2022 0113   K 3.7 12/03/2013 2155   CL 101 08/22/2022 0113   CL 109 (H) 12/03/2013 2155   CO2 23 08/22/2022 0113   CO2 25 12/03/2013 2155   GLUCOSE 110 (H) 08/22/2022 0113   GLUCOSE 106 (H) 12/03/2013 2155   BUN 10 08/22/2022 0113   BUN 10 12/03/2013 2155   CREATININE 0.58  08/22/2022 0113   CREATININE 0.71 12/03/2013 2155   CALCIUM 8.9 08/22/2022 0113   CALCIUM 8.7 12/03/2013 2155   GFRNONAA >60 08/22/2022 0113   GFRNONAA >60 12/03/2013 2155    IMAGING past 24 hours No results found.  Vitals:   08/26/22 0500 08/26/22 0804 08/26/22 0900 08/26/22 1100  BP:  135/64  131/77  Pulse:      Resp:      Temp:  98.1 F (36.7 C)  98.8 F (37.1 C)  TempSrc:  Oral  Oral  SpO2:   92%   Weight: 107.5 kg     Height:         PHYSICAL EXAM General:  Alert, well-nourished, well-developed pleasant elderly Caucasian lady in no acute distress Psych:  Mood and affect appropriate for situation CV: Regular rate and rhythm on monitor Respiratory:  Regular, unlabored respirations on room air GI: Abdomen soft and nontender   NEURO:  Mental Status: AA&Ox3, patient is able to give clear and coherent history Speech/Language: speech is without dysarthria or aphasia.  Naming, repetition, fluency, and comprehension intact.  Cranial Nerves:  II: PERRL. Visual fields full.  III, IV, VI: EOMI. Eyelids elevate symmetrically.  V: Sensation is intact to light touch and symmetrical to face.  VII: Face is symmetrical resting and smiling VIII: hearing intact to voice. IX, X: Palate elevates symmetrically.  Phonation is normal.  [id number] shrug 5/5. XII: tongue is midline without fasciculations. Motor: 5/5 strength to all muscle groups tested.  Tone: is normal and bulk is normal Sensation- Intact to light touch bilaterally. Extinction absent to light touch to DSS.   Coordination: FTN intact bilaterally, HKS: no ataxia in BLE.No drift.  Gait- deferred   ASSESSMENT/PLAN  Acute Ischemic Infarct:  Right occipital and cerebellum infarcts Etiology: Severe basilar artery stenosis likely from atherosclerosis.  Doubt dissection Code Stroke CT head No acute abnormality.  CTA head & neck Severe (nearly occlusive) basilar artery stenosis with an appearance that could reflect  dissection (particularly given significant progression since 2015 MRI), although atherosclerotic disease could have a similar appearance. Moderate right M1 MCA stenosis. Approximately 50% stenosis of the proximal right ICA in the neck. MRI   cluster of small acute infarcts in the right occipital lobe and cerebellum  MRA  severe stenosis of the mid basilar artery with proximal tapering, suspicious for dissection versus thrombus.  Carotid Doppler  1-39% stenosis bilaterally 2D Echo echo ejection 65 to 70%.  Left atrium size normal LDL 182 HgbA1c 5.8 VTE prophylaxis - SCDs No antithrombotic prior to admission, now on aspirin 81 mg daily and clopidogrel 75 mg daily Consider brilinta if she is able to afford Therapy recommendations:  CIR Disposition:  pending   Hypertension Stable Blood Pressure Goal: BP less than 220/110   Hyperlipidemia Home meds: Rosuvastatin - resumed.  Patient has history of statin intolerance LDL 182, goal < 70 Consider Leqvio High intensity statin not indicated  Continue statin at discharge  Other Stroke Risk Factors Obesity, Body mass index is 39.44 kg/m., BMI >/= 30 associated with increased stroke risk, recommend weight loss, diet and exercise as appropriate   Other Active Problems Hypothyroidism Fibromyalgia GERD Liver cirrhosis 5.3 cm ascending aortic aneurysm Depression  Hospital day # 5  Continue to mobilize out of bed.  Continue aspirin and Plavix and aggressive risk factor modification.  Continue ongoing therapies.  Transfer to inpatient rehab when bed available.   Patient is considering possible participation in [facility] stroke prevention study  .discussion with patient and Dr Thedore Mins and answered questions.  Greater than 50% time during this 25-minute visit was spent in counseling and coordination of care about her cerebellar stroke and symptomatic severe basilar artery stenosis and discussion about treatment options including maximal medical therapy  and elective angioplasty stenting and answering questions. Delia Heady, MD Medical Director [facility] Pager: [phone] 08/26/2022 3:17 PM   To contact Stroke Continuity provider, please refer to [email or web address]. After hours, contact General Neurology

## 2022-08-26 NOTE — Progress Notes (Signed)
PROGRESS NOTE        PATIENT DETAILS Name: Jenny Acosta Age: 77 y.o. Sex: female Date of Birth: Jul 10, 1945 Admit Date: 08/21/2022 Admitting Physician Angie Fava, DO JWJ:XBJY, Len Blalock, MD  Brief Summary: Patient is a 77 y.o.  female with history of HTN, HLD, hypothyroidism who presented with vertigo, nausea, vomiting-found to have acute CVA and probable aspiration pneumonia.  Significant events: 6/27>> admit to [facility] 6/28>> transferred to [facility]  Significant studies: 6/27>> CT head: No acute abnormalities 6/27>> CT C-spine: No acute cervical spine fracture.  Possible acute fracture of left T2 transverse process. 6/27>> CT angio chest/abdomen/pelvis dissection protocol: No acute intrathoracic/abdominal/pelvic pathology.  5.3 cm ascending aortic aneurysm. 6/28>> MRI brain: Acute infarct right cerebellum and mildly scattered right PCA territory. 6/28>> MRA brain: Severe stenosis of mid basilar artery with proximal tapering-dissection versus thrombus. 6/28>> CT angio head/neck: Severe basilar artery stenosis with appearance that could reflect dissection.  50% stenosis of proximal right ICA. 6/28>> bilateral carotid artery Doppler: 1-39% stenosis bilaterally. 6/29>> LDL: 182 6/29>> A1c: Pending  Significant microbiology data: 6/27>> COVID/influenza/RSV PCR: Negative  Procedures: None  Consults: Neurology  Subjective:   Patient in bed, appears comfortable, mild headache, no fever, no chest pain or pressure, no shortness of breath , no abdominal pain. No new focal weakness.   Objective: Vitals: Blood pressure (!) 144/77, pulse 100, temperature 98 F (36.7 C), temperature source Oral, resp. rate 20, height 5\' 5"  (1.651 m), weight 107.5 kg, SpO2 95 %.   Exam:  Awake Alert, No new F.N deficits, Normal affect [place].AT,PERRAL Supple Neck, No JVD,   Symmetrical Chest wall movement, Good air movement bilaterally, CTAB RRR,No Gallops, Rubs or new  Murmurs,  +ve B.Sounds, Abd Soft, No tenderness,   No Cyanosis, Clubbing or edema    Assessment/Plan:  Acute CVA involving right cerebellum/scattered PCA territory infarct Severe basilar artery stenosis versus dissection This discussed in detail with patient and the stroke team Dr. Pearlean Brownie on 08/24/2022, plan is for DAPT for 3 months thereafter aspirin, now refuses to try STATIN - says is allergic,  add Zetia for better LDL control, patient's basilar artery is most likely atherosclerotic, for now patient and family do not want any invasive procedures.  Plan is follow-up with neurology in the next 4 to 6 weeks and then consider if stenting procedure is required which in turn is quite high risk.  For now continue supportive care for stroke, she awaits CIR bed.  Stable A1c.  Aspiration pneumonia Presumed aspiration pneumonia in the setting of vomiting due to vertigo/CVA On Rocephin and azithromycin, switch to Augmentin for 2 more days, clinically pneumonia resolved Speech therapy following.  HLD LDL significantly elevated Add Zetia Probably will need Repatha injections as an outpatient  Hypothyroidism Synthroid TSH stable  GERD PPI  Depression Paxil  Liver cirrhosis Seen incidentally on CT abdomen-will need further workup in the outpatient setting-currently asymptomatic  Possible acute fracture of left T2 transverse process.  Seen incidentally on neuroimaging Obtain CT T spine to confirm  5.3 cm ascending aortic aneurysm Radiology recommending semiannual imaging by CTA or MRA and outpatient cardiothoracic follow-up.  3 mm focal subpleural nodular consolidation right middle lobe No symptoms currently Repeat imaging in 4 to 6 weeks  Obesity: Estimated body mass index is 39.44 kg/m as calculated from the following:   Height as of  this encounter: 5\' 5"  (1.651 m).   Weight as of this encounter: 107.5 kg.   Code status:   Code Status: Full Code   DVT Prophylaxis: heparin  injection 5,000 Units Start: 08/24/22 0945 SCDs Start: 08/21/22 2110 Heparin  Family Communication: None at bedside   Disposition Plan: Status is: Inpatient Remains inpatient appropriate because: Severity of illness   Planned Discharge Destination:Rehabilitation facility   Diet: Diet Order             Diet regular Room service appropriate? Yes; Fluid consistency: Thin  Diet effective now                    MEDICATIONS: Scheduled Meds:  aspirin  81 mg Oral Daily   clopidogrel  75 mg Oral Daily   ezetimibe  10 mg Oral Daily   guaiFENesin  600 mg Oral BID   heparin injection (subcutaneous)  5,000 Units Subcutaneous Q8H   levothyroxine  100 mcg Oral Q0600   loratadine  10 mg Oral Daily   nortriptyline  10 mg Oral BID   pantoprazole  40 mg Oral Daily   PARoxetine  30 mg Oral Daily   polyethylene glycol  17 g Oral Daily   Continuous Infusions:   PRN Meds:.acetaminophen **OR** acetaminophen, benzonatate, meclizine, melatonin, ondansetron (ZOFRAN) IV, traMADol   I have personally reviewed following labs and imaging studies  LABORATORY DATA:  Recent Labs  Lab 08/21/22 1314 08/22/22 0113  WBC 9.9 9.8  HGB 15.1* 15.2*  HCT 47.6* 47.5*  PLT 238 253  MCV 89.1 88.5  MCH 28.3 28.3  MCHC 31.7 32.0  RDW 14.5 14.3  LYMPHSABS 2.3 3.3  MONOABS 0.7 1.0  EOSABS 0.1 0.0  BASOSABS 0.0 0.0    Recent Labs  Lab 08/21/22 1314 08/21/22 1329 08/22/22 0113 08/23/22 0321  NA 140  --  136  --   K 3.6  --  3.7  --   CL 101  --  101  --   CO2 26  --  23  --   ANIONGAP 13  --  12  --   GLUCOSE 125*  --  110*  --   BUN 14  --  10  --   CREATININE 0.80  --  0.58  --   AST 28  --  29  --   ALT 27  --  27  --   ALKPHOS 118  --  116  --   BILITOT 0.6  --  0.7  --   ALBUMIN 4.1  --  4.0  --   PROCALCITON  --   --  <0.10  --   TSH  --   --  0.538  --   HGBA1C  --   --   --  5.9*  AMMONIA  --  13  --   --   MG 2.3  --  2.4  --   CALCIUM 9.1  --  8.9  --     Lab  Results  Component Value Date   CHOL 131 08/23/2022   HDL 60 08/23/2022   LDLCALC 66 08/23/2022   LDLDIRECT 121.0 06/17/2022   TRIG 24 08/23/2022   CHOLHDL 2.2 08/23/2022    Lab Results  Component Value Date   HGBA1C 5.9 (H) 08/23/2022     RADIOLOGY STUDIES/RESULTS: No results found.   LOS: 5 days   Signature  -    Susa Raring M.D on 08/26/2022 at 7:17 AM   -  To page go to [email or web address]

## 2022-08-26 NOTE — Progress Notes (Signed)
Inpatient Rehab Admissions Coordinator:   I do not have a bed for this patient to admit today. I will follow for potential admit in the next 1-2 days pending bed availability.   Estill Dooms, PT, DPT Admissions Coordinator [phone] 08/26/22  10:48 AM

## 2022-08-27 ENCOUNTER — Other Ambulatory Visit: Payer: Self-pay

## 2022-08-27 ENCOUNTER — Inpatient Hospital Stay (HOSPITAL_COMMUNITY)
Admission: RE | Admit: 2022-08-27 | Discharge: 2022-09-03 | DRG: 057 | Disposition: A | Discharge status: Home Health | Payer: 59 | Source: Intra-hospital | Attending: Physical Medicine & Rehabilitation | Admitting: Physical Medicine & Rehabilitation

## 2022-08-27 DIAGNOSIS — Z9049 Acquired absence of other specified parts of digestive tract: Secondary | ICD-10-CM | POA: Diagnosis not present

## 2022-08-27 DIAGNOSIS — E669 Obesity, unspecified: Secondary | ICD-10-CM | POA: Diagnosis present

## 2022-08-27 DIAGNOSIS — Z888 Allergy status to other drugs, medicaments and biological substances status: Secondary | ICD-10-CM

## 2022-08-27 DIAGNOSIS — I639 Cerebral infarction, unspecified: Secondary | ICD-10-CM

## 2022-08-27 DIAGNOSIS — I7121 Aneurysm of the ascending aorta, without rupture: Secondary | ICD-10-CM | POA: Diagnosis present

## 2022-08-27 DIAGNOSIS — E039 Hypothyroidism, unspecified: Secondary | ICD-10-CM | POA: Diagnosis present

## 2022-08-27 DIAGNOSIS — M797 Fibromyalgia: Secondary | ICD-10-CM | POA: Diagnosis not present

## 2022-08-27 DIAGNOSIS — Z6839 Body mass index (BMI) 39.0-39.9, adult: Secondary | ICD-10-CM | POA: Diagnosis not present

## 2022-08-27 DIAGNOSIS — K5900 Constipation, unspecified: Secondary | ICD-10-CM | POA: Diagnosis present

## 2022-08-27 DIAGNOSIS — Z79899 Other long term (current) drug therapy: Secondary | ICD-10-CM

## 2022-08-27 DIAGNOSIS — E785 Hyperlipidemia, unspecified: Secondary | ICD-10-CM | POA: Diagnosis not present

## 2022-08-27 DIAGNOSIS — R5381 Other malaise: Secondary | ICD-10-CM | POA: Diagnosis present

## 2022-08-27 DIAGNOSIS — Z79890 Hormone replacement therapy (postmenopausal): Secondary | ICD-10-CM | POA: Diagnosis not present

## 2022-08-27 DIAGNOSIS — Z823 Family history of stroke: Secondary | ICD-10-CM

## 2022-08-27 DIAGNOSIS — Z8261 Family history of arthritis: Secondary | ICD-10-CM

## 2022-08-27 DIAGNOSIS — M1990 Unspecified osteoarthritis, unspecified site: Secondary | ICD-10-CM | POA: Diagnosis present

## 2022-08-27 DIAGNOSIS — Z8619 Personal history of other infectious and parasitic diseases: Secondary | ICD-10-CM | POA: Diagnosis not present

## 2022-08-27 DIAGNOSIS — Z882 Allergy status to sulfonamides status: Secondary | ICD-10-CM

## 2022-08-27 DIAGNOSIS — G8929 Other chronic pain: Secondary | ICD-10-CM | POA: Diagnosis present

## 2022-08-27 DIAGNOSIS — K219 Gastro-esophageal reflux disease without esophagitis: Secondary | ICD-10-CM | POA: Diagnosis present

## 2022-08-27 DIAGNOSIS — J309 Allergic rhinitis, unspecified: Secondary | ICD-10-CM | POA: Diagnosis not present

## 2022-08-27 DIAGNOSIS — I69398 Other sequelae of cerebral infarction: Secondary | ICD-10-CM | POA: Diagnosis not present

## 2022-08-27 DIAGNOSIS — K5901 Slow transit constipation: Secondary | ICD-10-CM | POA: Diagnosis not present

## 2022-08-27 DIAGNOSIS — I6521 Occlusion and stenosis of right carotid artery: Secondary | ICD-10-CM | POA: Diagnosis present

## 2022-08-27 DIAGNOSIS — K7460 Unspecified cirrhosis of liver: Secondary | ICD-10-CM | POA: Diagnosis not present

## 2022-08-27 DIAGNOSIS — I6329 Cerebral infarction due to unspecified occlusion or stenosis of other precerebral arteries: Secondary | ICD-10-CM | POA: Diagnosis not present

## 2022-08-27 DIAGNOSIS — Z8719 Personal history of other diseases of the digestive system: Secondary | ICD-10-CM | POA: Diagnosis not present

## 2022-08-27 DIAGNOSIS — I10 Essential (primary) hypertension: Secondary | ICD-10-CM | POA: Diagnosis present

## 2022-08-27 DIAGNOSIS — R7302 Impaired glucose tolerance (oral): Secondary | ICD-10-CM | POA: Diagnosis present

## 2022-08-27 LAB — CBC WITH DIFFERENTIAL/PLATELET
Abs Immature Granulocytes: 0.01 10*3/uL (ref 0.00–0.07)
Basophils Absolute: 0 10*3/uL (ref 0.0–0.1)
Basophils Relative: 0 %
Eosinophils Absolute: 0.2 10*3/uL (ref 0.0–0.5)
Eosinophils Relative: 2 %
HCT: 43 % (ref 36.0–46.0)
Hemoglobin: 14 g/dL (ref 12.0–15.0)
Immature Granulocytes: 0 %
Lymphocytes Relative: 45 %
Lymphs Abs: 4.7 10*3/uL — ABNORMAL HIGH (ref 0.7–4.0)
MCH: 28.8 pg (ref 26.0–34.0)
MCHC: 32.6 g/dL (ref 30.0–36.0)
MCV: 88.5 fL (ref 80.0–100.0)
Monocytes Absolute: 0.8 10*3/uL (ref 0.1–1.0)
Monocytes Relative: 8 %
Neutro Abs: 4.6 10*3/uL (ref 1.7–7.7)
Neutrophils Relative %: 45 %
Platelets: 233 10*3/uL (ref 150–400)
RBC: 4.86 MIL/uL (ref 3.87–5.11)
RDW: 14.4 % (ref 11.5–15.5)
WBC: 10.3 10*3/uL (ref 4.0–10.5)
nRBC: 0 % (ref 0.0–0.2)

## 2022-08-27 LAB — BASIC METABOLIC PANEL
Anion gap: 11 (ref 5–15)
BUN: 11 mg/dL (ref 8–23)
CO2: 24 mmol/L (ref 22–32)
Calcium: 9.1 mg/dL (ref 8.9–10.3)
Chloride: 103 mmol/L (ref 98–111)
Creatinine, Ser: 0.68 mg/dL (ref 0.44–1.00)
GFR, Estimated: >60 mL/min (ref >60)
Glucose, Bld: 118 mg/dL — ABNORMAL HIGH (ref 70–99)
Potassium: 3.7 mmol/L (ref 3.5–5.1)
Sodium: 138 mmol/L (ref 135–145)

## 2022-08-27 LAB — MAGNESIUM: Magnesium: 2.3 mg/dL (ref 1.7–2.4)

## 2022-08-27 MED ORDER — HEPARIN SODIUM (PORCINE) 5000 UNIT/ML IJ SOLN
5000.0000 [IU] | Freq: Three times a day (TID) | INTRAMUSCULAR | Status: DC
  Administered 2022-08-27 – 2022-09-03 (×20): 5000 [IU] via SUBCUTANEOUS
  Filled 2022-08-27 (×20): qty 1

## 2022-08-27 MED ORDER — HEPARIN SODIUM (PORCINE) 5000 UNIT/ML IJ SOLN
5000.0000 [IU] | Freq: Three times a day (TID) | INTRAMUSCULAR | Status: DC
Start: 2022-08-27 — End: 2022-08-27

## 2022-08-27 MED ORDER — PAROXETINE HCL 30 MG PO TABS
30.0000 mg | ORAL_TABLET | Freq: Every day | ORAL | Status: DC
  Administered 2022-08-28 – 2022-09-03 (×7): 30 mg via ORAL
  Filled 2022-08-27 (×7): qty 1

## 2022-08-27 MED ORDER — LORATADINE 10 MG PO TABS
10.0000 mg | ORAL_TABLET | Freq: Every day | ORAL | Status: DC
  Administered 2022-08-28 – 2022-09-03 (×7): 10 mg via ORAL
  Filled 2022-08-27 (×7): qty 1

## 2022-08-27 MED ORDER — GUAIFENESIN ER 600 MG PO TB12
600.0000 mg | ORAL_TABLET | Freq: Two times a day (BID) | ORAL | Status: DC
  Administered 2022-08-27 – 2022-09-03 (×14): 600 mg via ORAL
  Filled 2022-08-27 (×14): qty 1

## 2022-08-27 MED ORDER — ACETAMINOPHEN 325 MG PO TABS
650.0000 mg | ORAL_TABLET | Freq: Four times a day (QID) | ORAL | Status: DC | PRN
  Administered 2022-08-27 – 2022-09-02 (×5): 650 mg via ORAL
  Filled 2022-08-27 (×4): qty 2

## 2022-08-27 MED ORDER — EZETIMIBE 10 MG PO TABS: 10.0000 mg | ORAL_TABLET | Freq: Every day | ORAL | Status: DC

## 2022-08-27 MED ORDER — MECLIZINE HCL 25 MG PO TABS: 25.0000 mg | ORAL_TABLET | Freq: Three times a day (TID) | ORAL | Status: DC | PRN

## 2022-08-27 MED ORDER — CLOPIDOGREL BISULFATE 75 MG PO TABS
75.0000 mg | ORAL_TABLET | Freq: Every day | ORAL | Status: DC
  Administered 2022-08-28 – 2022-09-03 (×7): 75 mg via ORAL
  Filled 2022-08-27 (×7): qty 1

## 2022-08-27 MED ORDER — ASPIRIN 81 MG PO CHEW
81.0000 mg | CHEWABLE_TABLET | Freq: Every day | ORAL | Status: DC
  Administered 2022-08-28 – 2022-09-03 (×7): 81 mg via ORAL
  Filled 2022-08-27 (×7): qty 1

## 2022-08-27 MED ORDER — ASPIRIN 81 MG PO CHEW: 81.0000 mg | CHEWABLE_TABLET | Freq: Every day | ORAL | Status: AC

## 2022-08-27 MED ORDER — NORTRIPTYLINE HCL 10 MG PO CAPS
10.0000 mg | ORAL_CAPSULE | Freq: Two times a day (BID) | ORAL | Status: DC
  Administered 2022-08-27 – 2022-09-02 (×12): 10 mg via ORAL
  Filled 2022-08-27 (×14): qty 1

## 2022-08-27 MED ORDER — CLOPIDOGREL BISULFATE 75 MG PO TABS: 75.0000 mg | ORAL_TABLET | Freq: Every day | ORAL | Status: DC

## 2022-08-27 MED ORDER — MELATONIN 3 MG PO TABS
3.0000 mg | ORAL_TABLET | Freq: Every evening | ORAL | Status: DC | PRN
  Administered 2022-08-28: 3 mg via ORAL
  Filled 2022-08-27: qty 1

## 2022-08-27 MED ORDER — ACETAMINOPHEN 650 MG RE SUPP: 650.0000 mg | Freq: Four times a day (QID) | RECTAL | Status: DC | PRN

## 2022-08-27 MED ORDER — BENZONATATE 100 MG PO CAPS: 200.0000 mg | ORAL_CAPSULE | Freq: Three times a day (TID) | ORAL | Status: DC | PRN

## 2022-08-27 MED ORDER — LEVOTHYROXINE SODIUM 100 MCG PO TABS
100.0000 ug | ORAL_TABLET | Freq: Every day | ORAL | Status: DC
  Administered 2022-08-28 – 2022-09-03 (×7): 100 ug via ORAL
  Filled 2022-08-27 (×7): qty 1

## 2022-08-27 MED ORDER — EZETIMIBE 10 MG PO TABS
10.0000 mg | ORAL_TABLET | Freq: Every day | ORAL | Status: DC
  Administered 2022-08-28 – 2022-09-01 (×5): 10 mg via ORAL
  Filled 2022-08-27 (×5): qty 1

## 2022-08-27 MED ORDER — PANTOPRAZOLE SODIUM 40 MG PO TBEC
40.0000 mg | DELAYED_RELEASE_TABLET | Freq: Every day | ORAL | Status: DC
  Administered 2022-08-28 – 2022-09-02 (×6): 40 mg via ORAL
  Filled 2022-08-27 (×6): qty 1

## 2022-08-27 MED ORDER — POLYETHYLENE GLYCOL 3350 17 G PO PACK
17.0000 g | PACK | Freq: Every day | ORAL | Status: DC
  Administered 2022-08-28 – 2022-09-02 (×6): 17 g via ORAL
  Filled 2022-08-27 (×7): qty 1

## 2022-08-27 MED ORDER — TRAMADOL HCL 50 MG PO TABS
50.0000 mg | ORAL_TABLET | Freq: Three times a day (TID) | ORAL | Status: DC | PRN
  Administered 2022-08-27 – 2022-08-29 (×6): 50 mg via ORAL
  Filled 2022-08-27 (×6): qty 1

## 2022-08-27 NOTE — Progress Notes (Signed)
Patient transferred to 4 west room 15 via patient wheelchair and took both her bags phone and Consulting civil engineer.

## 2022-08-27 NOTE — Progress Notes (Signed)
PMR Admission Coordinator Pre-Admission Assessment   Patient: Jenny Acosta is an 77 y.o., female MRN: [id number] DOB: 09/10/1945 Height: 5\' 5"  (165.1 cm) Weight: 107.5 kg   Insurance Information HMO: yes    PPO:      PCP:      IPA:      80/20:      OTHER:  PRIMARY: U[employer] Medicare      Policy#: [id number]      Subscriber: patient CM Name: TBD      Phone#: TBD     Fax#: [phone] Pre-Cert#: [id number]  Received insurance authorization from [place]. Pt approved beginning 08/25/22-09/02/22. Updates due 09/02/22.  Authorization # [id number].  Employer:  Benefits:  Phone #: [email or web address]     Name:  Eff. Date: 02/24/22     Deduct: $240 ($235.18 met)      Out of Pocket Max: $8,850 ($235.18 met)      Life Max: NA CIR: $1,730 co-pay/admission      SNF: 100% coverage for days 1-20, $204 co-pay/day for days 21-100 Outpatient: 80% coverage     Co-Pay: 20% co-insurance Home Health: 100% coverage      Co-Pay:  DME: 80% coverage     Co-Pay: 20% co-insurance Providers: in-network SECONDARY:       Policy#:      Phone#:    Artist:       Phone#:  The Data processing manager" for patients in Inpatient Rehabilitation Facilities with attached "Privacy Act Statement-Health Care Records" was provided and verbally reviewed with: Patient   Emergency Contact Information Contact Information       Name Relation Home Work Mobile    Cunningham,Leslie Daughter [phone]   [phone]    Agustina Caroli [phone]   [phone]           Current Medical History  Patient Admitting Diagnosis: CVA History of Present Illness: Pt is a 77 year old female with medical hx significant for: ascending thoracic aortic aneurysm, hyperlipidemia, GERD, carotid stenosis, acquired hypothyroidism.  Pt presented to [facility] on 08/21/22 d/t weakness and syncopal episode. Pt vomited after syncopal episode. CT head and cervical spine negative for acute abnormalities. Chest x-ray concerning for  PNA. CTA chest/abdomen/pelvis showed known ascending thoracic aortic aneurysm without corresponding evidence of dissection. CTA chest also showed right middle lobe airspace opacity concerning for infiltrate without evidence of edema, pleural effusion or pneumothorax. Pt transferred to [facility] on 08/22/22.  MRI revealed acute infarcts in right cerebellum and mildly scattered in right PCA territory. MRA revealed severe stenosis of mild basilar artery with proximal tapering, suspicious for dissection versus thrombus. CTA head and neck showed severe basilar artery stenosis with an appearance that could reflect dissection; 50% stenosis of proximal right ICA. Bilateral carotid artery doppler showed 1-39% stenosis bilateral. Therapy evaluations completed and CIR recommended d/t pt's deficits in functional mobility and inability to complete ADLs independently.  Complete NIHSS TOTAL: 0   Patient's medical record from [facility] has been reviewed by the rehabilitation admission coordinator and physician.   Past Medical History      Past Medical History:  Diagnosis Date   Allergic rhinitis, cause unspecified     Arthritis     Carotid stenosis     Family history of colon cancer 07/21/2013    mother   Gallstone     GERD (gastroesophageal reflux disease)     Hyperlipidemia 08/30/2010   Hypertension     Hypothyroidism     Impaired glucose tolerance  Pain, coccyx     Right lumbar radiculopathy 08/30/2010      Has the patient had major surgery during 100 days prior to admission? No   Family History   family history includes Arthritis in her sister; Atrial fibrillation in her sister; Colon cancer in her mother; Diabetes in her daughter and maternal grandmother; Stroke in her mother.   Current Medications   Current Facility-Administered Medications:    acetaminophen (TYLENOL) tablet 650 mg, 650 mg, Oral, Q6H PRN, 650 mg at 08/26/22 2229 **OR** acetaminophen (TYLENOL) suppository 650  mg, 650 mg, Rectal, Q6H PRN, Regalado, Belkys A, MD   aspirin chewable tablet 81 mg, 81 mg, Oral, Daily, Regalado, Belkys A, MD, 81 mg at 08/27/22 0849   benzonatate (TESSALON) capsule 200 mg, 200 mg, Oral, TID PRN, Regalado, Belkys A, MD, 200 mg at 08/22/22 1137   clopidogrel (PLAVIX) tablet 75 mg, 75 mg, Oral, Daily, Regalado, Belkys A, MD, 75 mg at 08/27/22 0849   ezetimibe (ZETIA) tablet 10 mg, 10 mg, Oral, Daily, Ghimire, Shanker M, MD, 10 mg at 08/27/22 0849   guaiFENesin (MUCINEX) 12 hr tablet 600 mg, 600 mg, Oral, BID, Regalado, Belkys A, MD, 600 mg at 08/27/22 0849   heparin injection 5,000 Units, 5,000 Units, Subcutaneous, Q8H, Leroy Sea, MD, 5,000 Units at 08/27/22 0555   levothyroxine (SYNTHROID) tablet 100 mcg, 100 mcg, Oral, Q0600, Regalado, Belkys A, MD, 100 mcg at 08/27/22 0555   loratadine (CLARITIN) tablet 10 mg, 10 mg, Oral, Daily, Susa Raring K, MD, 10 mg at 08/27/22 0849   meclizine (ANTIVERT) tablet 25 mg, 25 mg, Oral, TID PRN, Regalado, Belkys A, MD   melatonin tablet 3 mg, 3 mg, Oral, QHS PRN, Regalado, Belkys A, MD, 3 mg at 08/26/22 2234   nortriptyline (PAMELOR) capsule 10 mg, 10 mg, Oral, BID, Susa Raring K, MD, 10 mg at 08/27/22 0851   ondansetron (ZOFRAN) injection 4 mg, 4 mg, Intravenous, Q6H PRN, Regalado, Belkys A, MD, 4 mg at 08/27/22 0853   pantoprazole (PROTONIX) EC tablet 40 mg, 40 mg, Oral, Daily, Regalado, Belkys A, MD, 40 mg at 08/27/22 0849   PARoxetine (PAXIL) tablet 30 mg, 30 mg, Oral, Daily, Regalado, Belkys A, MD, 30 mg at 08/27/22 0850   polyethylene glycol (MIRALAX / GLYCOLAX) packet 17 g, 17 g, Oral, Daily, Regalado, Belkys A, MD, 17 g at 08/25/22 0841   traMADol (ULTRAM) tablet 50 mg, 50 mg, Oral, Q8H PRN, Leroy Sea, MD, 50 mg at 08/27/22 0849   Patients Current Diet:  Diet Order                  Diet - low sodium heart healthy             Diet regular Room service appropriate? Yes; Fluid consistency: Thin  Diet effective  now                         Precautions / Restrictions Precautions Precautions: Fall Restrictions Weight Bearing Restrictions: No    Has the patient had 2 or more falls or a fall with injury in the past year? Yes   Prior Activity Level Limited Community (1-2x/wk): leaves for doctor's appointments   Prior Functional Level Self Care: Did the patient need help bathing, dressing, using the toilet or eating? Independent   Indoor Mobility: Did the patient need assistance with walking from room to room (with or without device)? Independent   Stairs: Did the patient  need assistance with internal or external stairs (with or without device)? Pt avoids stairs   Functional Cognition: Did the patient need help planning regular tasks such as shopping or remembering to take medications? Independent   Patient Information Are you of Hispanic, Latino/a,or Spanish origin?: A. No, not of Hispanic, Latino/a, or Spanish origin What is your race?: A. White Do you need or want an interpreter to communicate with a doctor or health care staff?: 0. No   Patient's Response To:  Health Literacy and Transportation Is the patient able to respond to health literacy and transportation needs?: Yes Health Literacy - How often do you need to have someone help you when you read instructions, pamphlets, or other written material from your doctor or pharmacy?: Never In the past 12 months, has lack of transportation kept you from medical appointments or from getting medications?: Yes (x1) In the past 12 months, has lack of transportation kept you from meetings, work, or from getting things needed for daily living?: No   Home Assistive Devices / Equipment Home Assistive Devices/Equipment: Environmental consultant (specify type) (rollator) Home Equipment: Rollator (4 wheels), Cane - single point, Shower seat - built in, [employer] - tub/shower   Prior Device Use: Indicate devices/aids used by the patient prior to current illness,  exacerbation or injury?  Rollator   Current Functional Level Cognition   Arousal/Alertness: Awake/alert Overall Cognitive Status: No family/caregiver present to determine baseline cognitive functioning Orientation Level: Oriented X4 General Comments: Pleasant and cooperative, benefits from simple 1 step commands, continues to require mod cueing for RW management around obstacles and with turns Memory: Appears intact Awareness: Appears intact Problem Solving: Appears intact Safety/Judgment: Appears intact    Extremity Assessment (includes Sensation/Coordination)   Upper Extremity Assessment: Generalized weakness RUE Deficits / Details: ROM WFL; MMT grossly 5/5 LUE Deficits / Details: ROM WFL; MMT grossly 4/5 throughout  Lower Extremity Assessment: Defer to PT evaluation RLE Deficits / Details: ROM WFL; MMT 5/5 but reports weak in standing RLE Sensation:  (reports stinging sensation R lower leg since CVA) LLE Deficits / Details: ROM WFL ; MMT 5/5 but reports weak in standing     ADLs   Overall ADL's : Needs assistance/impaired Eating/Feeding: Modified independent, Sitting Grooming: Wash/dry face, Oral care, Min guard, Standing Grooming Details (indicate cue type and reason): washed hands/face while standing sinkside with BUEs offloaded from RW, min guard A for stability Upper Body Bathing: Set up, Sitting Lower Body Bathing: Minimal assistance, Sit to/from stand Upper Body Dressing : Set up, Sitting Lower Body Dressing: Minimal assistance, Sit to/from stand Lower Body Dressing Details (indicate cue type and reason): pt demonstrated figure four positioning to simulate LB dressing Toilet Transfer: Minimal assistance, Ambulation, Comfort height toilet, Rolling walker (2 wheels) Toilet Transfer Details (indicate cue type and reason): BSC placed over toilet Toileting- Clothing Manipulation and Hygiene: Min guard, Sit to/from stand Toileting - Clothing Manipulation Details (indicate cue  type and reason): min guard A for stability in standing during pericare Functional mobility during ADLs: Minimal assistance, Rolling walker (2 wheels) General ADL Comments: Limited secondary to dizziness, fatigue, and generalized weakness     Mobility   Overal bed mobility: Needs Assistance Bed Mobility: Supine to Sit Supine to sit: Min guard, HOB elevated Sit to supine: Min guard, HOB elevated General bed mobility comments: HOB elevated, increased time, min cues for safety and technique. Pt seated EOB at end of session     Transfers   Overall transfer level: Needs assistance Equipment  used: Rolling walker (2 wheels) Transfers: Sit to/from Stand Sit to Stand: Min assist, Min guard Bed to/from chair/wheelchair/BSC transfer type:: Step pivot Step pivot transfers: Min assist General transfer comment: minA to power up with first stand, progressing to minG with 2 other trials, requiring cueing with each trial for proper hand placement     Ambulation / Gait / Stairs / Wheelchair Mobility   Ambulation/Gait Ambulation/Gait assistance: Min guard, Min assist Gait Distance (Feet): 60 Feet Assistive device: Rolling walker (2 wheels) Gait Pattern/deviations: Decreased stride length, Trunk flexed, Wide base of support, Step-through pattern, Staggering left, Decreased step length - left, Decreased step length - right, Drifts right/left General Gait Details: minG to minA throughout ambulation, noted decrease in gait speed with very cautious gait. Initially with a L veer but intermittent B path deviation, noted ~3 losses of balance with minA provided. Moderate cueing provided for safety with RW management and cueing for turning. Gait velocity: decreased     Posture / Balance Dynamic Sitting Balance Sitting balance - Comments: sitting EOB Balance Overall balance assessment: Needs assistance Sitting-balance support: No upper extremity supported, Feet supported Sitting balance-Leahy Scale:  Fair Sitting balance - Comments: sitting EOB Standing balance support: Bilateral upper extremity supported, Reliant on assistive device for balance, During functional activity Standing balance-Leahy Scale: Poor Standing balance comment: reliant on RW     Special needs/care consideration NA    Previous Home Environment (from acute therapy documentation) Living Arrangements: Alone Available Help at Discharge: Family, Available 24 hours/day Type of Home: Independent living facility Home Layout: One level Home Access: Level entry Bathroom Shower/Tub: Health visitor: Handicapped height Bathroom Accessibility: Yes How Accessible: Accessible via wheelchair, Accessible via walker Home Care Services: No Additional Comments: If goes to daughter's she has 4-5 steps to enter.   Discharge Living Setting Plans for Discharge Living Setting: Apartment (daughter's apartment) Type of Home at Discharge: Apartment Discharge Home Layout: One level Discharge Home Access: Stairs to enter Entrance Stairs-Rails: Right Entrance Stairs-Number of Steps: 3 Discharge Bathroom Shower/Tub: Tub/shower unit Discharge Bathroom Toilet: Standard Does the patient have any problems obtaining your medications?: No   Social/Family/Support Systems Anticipated Caregiver: Roma Kayser, daughter Anticipated Caregiver's Contact Information: [phone] Caregiver Availability: 24/7 Discharge Plan Discussed with Primary Caregiver: Yes Is Caregiver In Agreement with Plan?: Yes Does Caregiver/Family have Issues with Lodging/Transportation while Pt is in Rehab?: No   Goals Patient/Family Goal for Rehab: Supevision-Mod I: PT/OT Expected length of stay: 5-7 days Pt/Family Agrees to Admission and willing to participate: Yes Program Orientation Provided & Reviewed with Pt/Caregiver Including Roles  & Responsibilities: Yes   Decrease burden of Care through IP rehab admission: NA   Possible need for SNF  placement upon discharge: Not anticipated   Patient Condition: I have reviewed medical records from [facility], spoken with CM, and patient and daughter. I met with patient at the bedside and discussed via phone for inpatient rehabilitation assessment.  Patient will benefit from ongoing PT and OT, can actively participate in 3 hours of therapy a day 5 days of the week, and can make measurable gains during the admission.  Patient will also benefit from the coordinated team approach during an Inpatient Acute Rehabilitation admission.  The patient will receive intensive therapy as well as Rehabilitation physician, nursing, social worker, and care management interventions.  Due to safety, disease management, medication administration, and patient education the patient requires 24 hour a day rehabilitation nursing.  The patient is currently min assist with mobility  and basic ADLs.  Discharge setting and therapy post discharge at home with home health is anticipated.  Patient has agreed to participate in the Acute Inpatient Rehabilitation Program and will admit today.   Preadmission Screen Completed By:  Domingo Pulse, 08/27/2022 10:28 AM ______________________________________________________________________   Discussed status with Dr. Carlis Abbott on 08/27/22  at 10:28 AM  and received approval for admission today.   Admission Coordinator:  Domingo Pulse, CCC-SLP, time 08/27/22 Dorna Bloom 10:28 AM     Assessment/Plan: Diagnosis: Debility 2/2 PNA Does the need for close, 24 hr/day Medical supervision in concert with the patient's rehab needs make it unreasonable for this patient to be served in a less intensive setting? Yes Co-Morbidities requiring supervision/potential complications: ascending thoracic aortic aneurysm, HLD, GERD, carotic stenosis, acquired hypothyroidism Due to bladder management, bowel management, safety, skin/wound care, disease management, medication administration, pain  management, and patient education, does the patient require 24 hr/day rehab nursing? Yes Does the patient require coordinated care of a physician, rehab nurse, PT, OT to address physical and functional deficits in the context of the above medical diagnosis(es)? Yes Addressing deficits in the following areas: balance, endurance, locomotion, strength, transferring, bowel/bladder control, bathing, dressing, feeding, grooming, toileting, and psychosocial support Can the patient actively participate in an intensive therapy program of at least 3 hrs of therapy 5 days a week? Yes The potential for patient to make measurable gains while on inpatient rehab is excellent Anticipated functional outcomes upon discharge from inpatient rehab: modified independent PT, modified independent OT, independent SLP Estimated rehab length of stay to reach the above functional goals is: 5-7 days Anticipated discharge destination: Home 10. Overall Rehab/Functional Prognosis: excellent     MD Signature: Sula Soda, MD

## 2022-08-27 NOTE — Discharge Instructions (Addendum)
STROKE/TIA DISCHARGE INSTRUCTIONS SMOKING Cigarette smoking nearly doubles your risk of having a stroke & is the single most alterable risk factor  If you smoke or have smoked in the last 12 months, you are advised to quit smoking for your health. Most of the excess cardiovascular risk related to smoking disappears within a year of stopping. Ask you doctor about anti-smoking medications  Quit Line: 1-800-QUIT NOW Free Smoking Cessation Classes [phone]  CHOLESTEROL Know your levels; limit fat & cholesterol in your diet  Lipid Panel     Component Value Date/Time   CHOL 131 08/23/2022 0904   TRIG 24 08/23/2022 0904   HDL 60 08/23/2022 0904   CHOLHDL 2.2 08/23/2022 0904   VLDL 5 08/23/2022 0904   LDLCALC 66 08/23/2022 0904     Many patients benefit from treatment even if their cholesterol is at goal. Goal: Total Cholesterol (CHOL) less than 160 Goal:  Triglycerides (TRIG) less than 150 Goal:  HDL greater than 40 Goal:  LDL (LDLCALC) less than 100   BLOOD PRESSURE American Stroke Association blood pressure target is less that 120/80 mm/Hg  Your discharge blood pressure is:  BP: 117/65 Monitor your blood pressure Limit your salt and alcohol intake Many individuals will require more than one medication for high blood pressure  DIABETES (A1c is a blood sugar average for last 3 months) Goal HGBA1c is under 7% (HBGA1c is blood sugar average for last 3 months)  Diabetes:   Lab Results  Component Value Date   HGBA1C 6.3 (H) 08/23/2022    Your HGBA1c can be lowered with medications, healthy diet, and exercise. Check your blood sugar as directed by your physician Call your physician if you experience unexplained or low blood sugars.  PHYSICAL ACTIVITY/REHABILITATION Goal is 30 minutes at least 4 days per week  Activity: Increase activity slowly, Therapies: Physical Therapy: Home Health Return to work:  Activity decreases your risk of heart attack and stroke and makes your heart  stronger.  It helps control your weight and blood pressure; helps you relax and can improve your mood. Participate in a regular exercise program. Talk with your doctor about the best form of exercise for you (dancing, walking, swimming, cycling).  DIET/WEIGHT Goal is to maintain a healthy weight  Your discharge diet is:  Diet Order             Diet regular Room service appropriate? Yes; Fluid consistency: Thin  Diet effective now                   liquids Your height is:  Height: 5\' 5"  (165.1 cm) Your current weight is: Weight: 108.9 kg Your Body Mass Index (BMI) is:  BMI (Calculated): 39.95 Following the type of diet specifically designed for you will help prevent another stroke. Your goal weight range is:   Your goal Body Mass Index (BMI) is 19-24. Healthy food habits can help reduce 3 risk factors for stroke:  High cholesterol, hypertension, and excess weight.  RESOURCES Stroke/Support Group:  Call [phone]   STROKE EDUCATION PROVIDED/REVIEWED AND GIVEN TO PATIENT Stroke warning signs and symptoms How to activate emergency medical system (call 911). Medications prescribed at discharge. Need for follow-up after discharge. Personal risk factors for stroke. Pneumonia vaccine given: No Flu vaccine given: No My questions have been answered, the writing is legible, and I understand these instructions.  I will adhere to these goals & educational materials that have been provided to me after my discharge from the hospital.  Inpatient Rehab Discharge Instructions  Jenny Acosta Discharge date and time: No discharge date for patient encounter.   Activities/Precautions/ Functional Status: Activity: As tolerated Diet: Regular Wound Care: Routine skin checks Functional status:  ___ No restrictions     ___ Walk up steps independently ___ 24/7 supervision/assistance   ___ Walk up steps with assistance ___ Intermittent supervision/assistance  ___ Bathe/dress independently ___  Walk with walker     _x__ Bathe/dress with assistance ___ Walk Independently    ___ Shower independently ___ Walk with assistance    ___ Shower with assistance ___ No alcohol     ___ Return to work/school ________  Special Instructions:  No driving smoking or alcohol  Plan aspirin 81 mg daily and Plavix 75 mg daily x 3 months then aspirin alone  Follow-up PCP Dr. Oliver Barre for repeat imaging 4 to 6 weeks of a 3 mm focal subpleural nodular consolidation right middle lobe  My questions have been answered and I understand these instructions. I will adhere to these goals and the provided educational materials after my discharge from the hospital.  Patient/Caregiver Signature _______________________________ Date __________  Clinician Signature _______________________________________ Date __________  Please bring this form and your medication list with you to all your follow-up doctor's appointments.

## 2022-08-27 NOTE — Progress Notes (Signed)
Inpatient Rehab Admissions Coordinator:    I have insurance approval and a bed available for pt to admit to CIR today. Dr. Thedore Mins in agreement.  Will let pt/family and TOC team know.   Estill Dooms, PT, DPT Admissions Coordinator [phone] 08/27/22  10:26 AM

## 2022-08-27 NOTE — Progress Notes (Signed)
STROKE TEAM PROGRESS NOTE   BRIEF HPI [name] is a 77 y.o. female with history of carotid stenosis, HTN, HLD, GERD, hypothyroidism, who presented to [facility] ED from her facility for weakness, dizziness and sustaining a fall.  presenting 6/27 after falling. She reports double vision, dizziness.  She may additionally have some aspiration pneumonia which is being managed by the primary team.   SIGNIFICANT HOSPITAL EVENTS 6/27- admitted  6/29-- loaded with plavix 6/28-MRI right superior cerebellar artery infarct.  Small scattered right PCA infarcts 6/28-CT angiogram severe near occlusive basilar artery stenosis.  Moderate right M1 stenosis.  50% proximal right ICA stenosis in the neck.   INTERM HISTORY/SUBJECTIVE Patient is lying in bed comfortably..  She is doing better and is able to ambulate with physical therapy.   She has a bed today to go to inpatient rehab.  She is interested in participating in the [place] stroke prevention study and has reviewed the consent form and discussed this with her daughter. OBJECTIVE  CBC    Component Value Date/Time   WBC 10.3 08/27/2022 0343   RBC 4.86 08/27/2022 0343   HGB 14.0 08/27/2022 0343   HGB 14.8 12/03/2013 2155   HCT 43.0 08/27/2022 0343   HCT 46.7 12/03/2013 2155   PLT 233 08/27/2022 0343   PLT 275 12/03/2013 2155   MCV 88.5 08/27/2022 0343   MCV 89 12/03/2013 2155   MCH 28.8 08/27/2022 0343   MCHC 32.6 08/27/2022 0343   RDW 14.4 08/27/2022 0343   RDW 14.1 12/03/2013 2155   LYMPHSABS 4.7 (H) 08/27/2022 0343   MONOABS 0.8 08/27/2022 0343   EOSABS 0.2 08/27/2022 0343   BASOSABS 0.0 08/27/2022 0343    BMET    Component Value Date/Time   NA 138 08/27/2022 0343   NA 142 12/03/2013 2155   K 3.7 08/27/2022 0343   K 3.7 12/03/2013 2155   CL 103 08/27/2022 0343   CL 109 (H) 12/03/2013 2155   CO2 24 08/27/2022 0343   CO2 25 12/03/2013 2155   GLUCOSE 118 (H) 08/27/2022 0343   GLUCOSE 106 (H) 12/03/2013 2155   BUN 11  08/27/2022 0343   BUN 10 12/03/2013 2155   CREATININE 0.68 08/27/2022 0343   CREATININE 0.71 12/03/2013 2155   CALCIUM 9.1 08/27/2022 0343   CALCIUM 8.7 12/03/2013 2155   GFRNONAA >60 08/27/2022 0343   GFRNONAA >60 12/03/2013 2155    IMAGING past 24 hours No results found.  Vitals:   08/26/22 2347 08/27/22 0400 08/27/22 0432 08/27/22 0820  BP: (!) 162/78 136/74    Pulse: 78 91    Resp: 18 17    Temp: 97.9 F (36.6 C) 98.1 F (36.7 C)  97.8 F (36.6 C)  TempSrc: Oral Oral  Oral  SpO2:      Weight:   107.5 kg   Height:         PHYSICAL EXAM General:  Alert, well-nourished, well-developed pleasant elderly Caucasian lady in no acute distress Psych:  Mood and affect appropriate for situation CV: Regular rate and rhythm on monitor Respiratory:  Regular, unlabored respirations on room air GI: Abdomen soft and nontender   NEURO:  Mental Status: AA&Ox3, patient is able to give clear and coherent history Speech/Language: speech is without dysarthria or aphasia.  Naming, repetition, fluency, and comprehension intact.  Cranial Nerves:  II: PERRL. Visual fields full.  III, IV, VI: EOMI. Eyelids elevate symmetrically.  V: Sensation is intact to light touch and symmetrical to face.  VII:  Face is symmetrical resting and smiling VIII: hearing intact to voice. IX, X: Palate elevates symmetrically. Phonation is normal.  ZO:[id number] shrug 5/5. XII: tongue is midline without fasciculations. Motor: 5/5 strength to all muscle groups tested.  Tone: is normal and bulk is normal Sensation- Intact to light touch bilaterally. Extinction absent to light touch to DSS.   Coordination: FTN intact bilaterally, HKS: no ataxia in BLE.No drift.  Gait- deferred   ASSESSMENT/PLAN  Acute Ischemic Infarct:  Right occipital and cerebellum infarcts Etiology: Severe basilar artery stenosis likely from atherosclerosis.  Doubt dissection Code Stroke CT head No acute abnormality.  CTA head & neck  Severe (nearly occlusive) basilar artery stenosis with an appearance that could reflect dissection (particularly given significant progression since 2015 MRI), although atherosclerotic disease could have a similar appearance. Moderate right M1 MCA stenosis. Approximately 50% stenosis of the proximal right ICA in the neck. MRI   cluster of small acute infarcts in the right occipital lobe and cerebellum  MRA  severe stenosis of the mid basilar artery with proximal tapering, suspicious for dissection versus thrombus.  Carotid Doppler  1-39% stenosis bilaterally 2D Echo echo ejection 65 to 70%.  Left atrium size normal LDL 182 HgbA1c 5.8 VTE prophylaxis - SCDs No antithrombotic prior to admission, now on aspirin 81 mg daily and clopidogrel 75 mg daily into 3 months and then aspirin alone.  This may change if patient participates in the [place] trial Consider brilinta if she is able to afford Therapy recommendations:  CIR Disposition:  pending   Hypertension Stable Blood Pressure Goal: BP less than 220/110   Hyperlipidemia Home meds: Rosuvastatin - resumed.  Patient has history of statin intolerance LDL 182, goal < 70 Consider Leqvio High intensity statin not indicated  Continue statin at discharge  Other Stroke Risk Factors Obesity, Body mass index is 39.44 kg/m., BMI >/= 30 associated with increased stroke risk, recommend weight loss, diet and exercise as appropriate   Other Active Problems Hypothyroidism Fibromyalgia GERD Liver cirrhosis 5.3 cm ascending aortic aneurysm Depression  Hospital day # 6  Continue to mobilize out of bed.  Continue aspirin and Plavix and aggressive risk factor modification.  Continue ongoing therapies.  Transfer to inpatient rehab when bed available later today.   Patient is considering possible participation in [place] stroke prevention study and will do so later while she is in rehab..discussion with patient and Dr Thedore Mins and answered questions.   Greater than 50% time during this 25-minute visit was spent in counseling and coordination of care about her cerebellar stroke and symptomatic severe basilar artery stenosis and discussion about treatment options including maximal medical therapy and elective angioplasty stenting and answering questions. Delia Heady, MD Medical Director [facility] Pager: [phone] 08/27/2022 2:03 PM   To contact Stroke Continuity provider, please refer to [email or web address]. After hours, contact General Neurology

## 2022-08-27 NOTE — Progress Notes (Signed)
Report called to [facility] RN patient transferring to room 15 on [address].

## 2022-08-27 NOTE — Discharge Instructions (Signed)
Follow with Primary MD [name], MD in 7 days   Get CBC, CMP, 2 view Chest X ray -  checked next visit with your primary MD or  CIR MD    Activity: As tolerated with Full fall precautions use walker/cane & assistance as needed  Disposition CIR  Diet: Heart Healthy    Special Instructions: If you have smoked or chewed Tobacco  in the last 2 yrs please stop smoking, stop any regular Alcohol  and or any Recreational drug use.  On your next visit with your primary care physician please Get Medicines reviewed and adjusted.  Please request your Prim.MD to go over all Hospital Tests and Procedure/Radiological results at the follow up, please get all Hospital records sent to your Prim MD by signing hospital release before you go home.  If you experience worsening of your admission symptoms, develop shortness of breath, life threatening emergency, suicidal or homicidal thoughts you must seek medical attention immediately by calling 911 or calling your MD immediately  if symptoms less severe.  You Must read complete instructions/literature along with all the possible adverse reactions/side effects for all the Medicines you take and that have been prescribed to you. Take any new Medicines after you have completely understood and accpet all the possible adverse reactions/side effects.

## 2022-08-27 NOTE — Discharge Summary (Signed)
Jenny Acosta:[id number] DOB: 11/23/1945 DOA: 08/21/2022  PCP: Jenny Levins, MD  Admit date: 08/21/2022  Discharge date: 08/27/2022  Admitted From: Home   Disposition:  CIR   Recommendations for Outpatient Follow-up:   Follow up with PCP in 1-2 weeks  PCP Please obtain BMP/CBC, 2 view CXR in 1week,  (see Discharge instructions)   PCP Please follow up on the following pending results:    Home Health: None   Equipment/Devices: None  Consultations: Neuro Discharge Condition: Stable    CODE STATUS: Full    Diet Recommendation: Heart Healthy   Chief Complaint  Patient presents with   Weakness   Dizziness        Fall     Brief history of present illness from the day of admission and additional interim summary    77 y.o.  female with history of HTN, HLD, hypothyroidism who presented with vertigo, nausea, vomiting-found to have acute CVA and probable aspiration pneumonia.   Significant events: 6/27>> admit to [facility] 6/28>> transferred to [facility]   Significant studies: 6/27>> CT head: No acute abnormalities 6/27>> CT C-spine: No acute cervical spine fracture.  Possible acute fracture of left T2 transverse process. 6/27>> CT angio chest/abdomen/pelvis dissection protocol: No acute intrathoracic/abdominal/pelvic pathology.  5.3 cm ascending aortic aneurysm. 6/28>> MRI brain: Acute infarct right cerebellum and mildly scattered right PCA territory. 6/28>> MRA brain: Severe stenosis of mid basilar artery with proximal tapering-dissection versus thrombus. 6/28>> CT angio head/neck: Severe basilar artery stenosis with appearance that could reflect dissection.  50% stenosis of proximal right ICA. 6/28>> bilateral carotid artery Doppler: 1-39% stenosis bilaterally. 6/29>> LDL: 182 6/29>> A1c: Pending    Significant microbiology data: 6/27>> COVID/influenza/RSV PCR: Negative                                                                 Hospital Course   Acute CVA involving right cerebellum/scattered PCA territory infarct Severe basilar artery stenosis versus dissection This discussed in detail with patient and the stroke team Dr. Pearlean Brownie on 08/24/2022, plan is for DAPT for 3 months thereafter aspirin, now refuses to try STATIN - says is allergic,  add Zetia for better LDL control, patient's basilar artery is most likely atherosclerotic, for now patient and family do not want any invasive procedures.  Plan is follow-up with neurology in the next 4 to 6 weeks and then consider if stenting procedure is required which in turn is quite high risk.  For now continue supportive care for stroke, she awaits CIR bed.  Stable A1c.   Aspiration pneumonia Presumed aspiration pneumonia in the setting of vomiting due to vertigo/CVA Finished antibiotics Was seen here and cleared by speech.   HLD LDL significantly elevated, refuses to take statin as she states she is allergic to it  Add Zetia Probably will need Repatha injections as an outpatient   Hypothyroidism Synthroid TSH stable   GERD PPI   Depression Paxil   Liver cirrhosis Seen incidentally on CT abdomen-will need further workup in the outpatient setting-currently asymptomatic, post discharge follow-up with PCP and GI.   Possible acute fracture of left T2 transverse process.  Seen incidentally on neuroimaging Symptom-free.  Outpatient neurosurgery follow-up.   5.3 cm ascending aortic aneurysm Radiology recommending semiannual imaging by CTA or MRA and outpatient cardiothoracic follow-up in 3 to 4 weeks postdischarge.   3 mm focal subpleural nodular consolidation right middle lobe No symptoms currently Repeat imaging in 4 to 6 weeks, follow-up with [facility] pulmonary 1 to 2 weeks postdischarge.   Obesity: Estimated body mass index is  39.44    Discharge diagnosis     Principal Problem:   CAP (community acquired pneumonia) Active Problems:   GERD (gastroesophageal reflux disease)   Allergic rhinitis   Acquired hypothyroidism   Hyperlipidemia   Generalized weakness   Vertigo   Fall at home, initial encounter   Thoracic ascending aortic aneurysm (HCC)   Dehydration   Depression   Basilar artery stenosis with infarction (HCC)   Basilar artery stenosis    Discharge instructions    Discharge Instructions     Diet - low sodium heart healthy   Complete by: As directed    Discharge instructions   Complete by: As directed    Follow with Primary MD Jenny Levins, MD in 7 days   Get CBC, CMP, 2 view Chest X ray -  checked next visit with your primary MD or  CIR MD    Activity: As tolerated with Full fall precautions use walker/cane & assistance as needed  Disposition CIR  Diet: Heart Healthy    Special Instructions: If you have smoked or chewed Tobacco  in the last 2 yrs please stop smoking, stop any regular Alcohol  and or any Recreational drug use.  On your next visit with your primary care physician please Get Medicines reviewed and adjusted.  Please request your Prim.MD to go over all Hospital Tests and Procedure/Radiological results at the follow up, please get all Hospital records sent to your Prim MD by signing hospital release before you go home.  If you experience worsening of your admission symptoms, develop shortness of breath, life threatening emergency, suicidal or homicidal thoughts you must seek medical attention immediately by calling 911 or calling your MD immediately  if symptoms less severe.  You Must read complete instructions/literature along with all the possible adverse reactions/side effects for all the Medicines you take and that have been prescribed to you. Take any new Medicines after you have completely understood and accpet all the possible adverse reactions/side effects.    Increase activity slowly   Complete by: As directed        Discharge Medications   Allergies as of 08/27/2022       Reactions   Lipitor [atorvastatin]    myalgia   Methocarbamol Other (See Comments)   hallucinations   Simvastatin    Hair loss   Sulfa Antibiotics    unknown        Medication List     STOP taking these medications    HYDROcodone bit-homatropine 5-1.5 MG/5ML syrup Commonly known as: HYCODAN   ondansetron 4 MG tablet Commonly known as: ZOFRAN   rosuvastatin 40 MG tablet Commonly known as: CRESTOR   tiZANidine 4 MG tablet Commonly known as: ZANAFLEX  triamcinolone 55 MCG/ACT Aero nasal inhaler Commonly known as: NASACORT       TAKE these medications    acetaminophen 500 MG tablet Commonly known as: TYLENOL Take 500 mg by mouth every 6 (six) hours as needed. For pain   albuterol 108 (90 Base) MCG/ACT inhaler Commonly known as: VENTOLIN HFA TAKE 2 PUFFS BY MOUTH EVERY 6 HOURS AS NEEDED FOR WHEEZE OR SHORTNESS OF BREATH What changed: See the new instructions.   ANTIHISTAMINE PO Take by mouth.   aspirin 81 MG chewable tablet Chew 1 tablet (81 mg total) by mouth daily. Start taking on: August 28, 2022   clopidogrel 75 MG tablet Commonly known as: PLAVIX Take 1 tablet (75 mg total) by mouth daily. Start taking on: August 28, 2022   ezetimibe 10 MG tablet Commonly known as: ZETIA Take 1 tablet (10 mg total) by mouth daily. Start taking on: August 28, 2022   levothyroxine 100 MCG tablet Commonly known as: SYNTHROID Take 100 mcg by mouth daily before breakfast. What changed: Another medication with the same name was removed. Continue taking this medication, and follow the directions you see here.   MUCUS RELIEF ADULT PO Take by mouth.   nortriptyline 10 MG capsule Commonly known as: PAMELOR 2 TAB BY MOUTH ONCE IN THE AM, THEN 1-3 TABS BY MOUTH AT BEDTIME AS NEEDED FOR PAIN AND SLEEP What changed: See the new instructions.   OMEGA-3  COMPLEX PO Take by mouth.   pantoprazole 40 MG tablet Commonly known as: PROTONIX TAKE 1 TABLET BY MOUTH EVERY DAY   PARoxetine 30 MG tablet Commonly known as: PAXIL TAKE 1 TABLET BY MOUTH EVERY DAY   Thera-D 2000 50 MCG (2000 UT) Tabs Generic drug: Cholecalciferol 1 tab by mouth epr day What changed:  how much to take how to take this when to take this additional instructions   traMADol 50 MG tablet Commonly known as: ULTRAM Take 1 tablet (50 mg total) by mouth every 6 (six) hours as needed.               Durable Medical Equipment  (From admission, onward)           Start     Ordered   08/21/22 1944  For home use only DME wheelchair cushion (seat and back)  Once        08/21/22 1943             Follow-up Information     [facility] Follow up.   Contact information: [address] [place], [place] [address] [phone]  Wheelchair        [facility] Follow up.   Contact information: [address] [place] [address] [phone]         Jenny Levins, MD Follow up in 1 week(s).   Specialties: Internal Medicine, Radiology Contact information: [address] [place] [address] [phone]         [facility] Follow up in 8 week(s).   Why: CVA Contact information: [address] [place] [address] [phone]         Loreli Slot, MD. Schedule an appointment as soon as possible for a visit in 1 month(s).   Specialty: Cardiothoracic Surgery Why: Thoracic artery aneurysm. Contact information: [address] [employer] [address] [place] [address] [phone]  Major procedures and Radiology Reports - PLEASE review detailed and final reports thoroughly  -      CT THORACIC SPINE WO CONTRAST  Result Date: 08/23/2022 CLINICAL DATA:  Possible left L2 transverse process fracture on MR EXAM: CT THORACIC SPINE WITHOUT  CONTRAST TECHNIQUE: Multidetector CT images of the thoracic were obtained using the standard protocol without intravenous contrast. RADIATION DOSE REDUCTION: This exam was performed according to the departmental dose-optimization program which includes automated exposure control, adjustment of the mA and/or kV according to patient size and/or use of iterative reconstruction technique. COMPARISON:  CTA chest dated 08/21/2022 FINDINGS: Alignment: Mild reverse S-shaped thoracic scoliosis. Normal thoracic kyphosis. Vertebrae: No acute fracture or focal pathologic process. Specifically, the left T2 transverse process is intact. Paraspinal and other soft tissues: Better evaluated on recent CTA chest. Atherosclerotic calcifications. Prior cholecystectomy. Disc levels: Mild multilevel degenerative changes. Spinal canal is patent. IMPRESSION: No fracture is seen. Specifically, the left T2 transverse process is intact. Electronically Signed   By: Charline Bills M.D.   On: 08/23/2022 20:25   ECHOCARDIOGRAM COMPLETE  Result Date: 08/23/2022    ECHOCARDIOGRAM REPORT   Patient Name:   Lanita Lemley Date of Exam: 08/23/2022 Medical Rec #:  [id number]      Height:       65.0 in Accession #:    [id number]     Weight:       220.0 lb Date of Birth:  [birth year]      BSA:          2.060 m Patient Age:    77 years       BP:           135/58 mmHg Patient Gender: F              HR:           73 bpm. Exam Location:  Inpatient Procedure: 2D Echo, Color Doppler and Cardiac Doppler Indications:     Stroke i63.9  History:         Patient has prior history of Echocardiogram examinations, most                  recent 10/17/2020. Risk Factors:Hypertension and Dyslipidemia.  Sonographer:     Irving Burton Senior RDCS Referring Phys:  [id number] Diagnosing Phys: Lennie Odor MD  Sonographer Comments: Technically difficult due to poor echo windows. IMPRESSIONS  1. Left ventricular ejection fraction, by estimation, is 65 to 70%. The left  ventricle has normal function. The left ventricle has no regional wall motion abnormalities. Left ventricular diastolic parameters are consistent with Grade I diastolic dysfunction (impaired relaxation).  2. Right ventricular systolic function is normal. The right ventricular size is normal. Tricuspid regurgitation signal is inadequate for assessing PA pressure.  3. The mitral valve is normal in structure. Trivial mitral valve regurgitation.  4. The aortic valve was not well visualized. Aortic valve regurgitation is not visualized.  5. Ascending aortic aneurysm up to 48 mm. 53 mm on recent CT imaging. Aortic dilatation noted. Aneurysm of the ascending aorta, measuring 48 mm.  6. The inferior vena cava is normal in size with greater than 50% respiratory variability, suggesting right atrial pressure of 3 mmHg. Conclusion(s)/Recommendation(s): No intracardiac source of embolism detected on this transthoracic study. Consider a transesophageal echocardiogram to exclude cardiac source of embolism if clinically indicated. FINDINGS  Left Ventricle: Left ventricular ejection fraction, by estimation, is 65 to 70%. The left ventricle has normal function. The left  ventricle has no regional wall motion abnormalities. The left ventricular internal cavity size was normal in size. There is  no left ventricular hypertrophy. Left ventricular diastolic parameters are consistent with Grade I diastolic dysfunction (impaired relaxation). Right Ventricle: The right ventricular size is normal. No increase in right ventricular wall thickness. Right ventricular systolic function is normal. Tricuspid regurgitation signal is inadequate for assessing PA pressure. Left Atrium: Left atrial size was normal in size. Right Atrium: Right atrial size was normal in size. Pericardium: Trivial pericardial effusion is present. Presence of epicardial fat layer. Mitral Valve: The mitral valve is normal in structure. Trivial mitral valve regurgitation.  Tricuspid Valve: The tricuspid valve is grossly normal. Tricuspid valve regurgitation is not demonstrated. No evidence of tricuspid stenosis. Aortic Valve: The aortic valve was not well visualized. Aortic valve regurgitation is not visualized. Pulmonic Valve: The pulmonic valve was grossly normal. Pulmonic valve regurgitation is not visualized. No evidence of pulmonic stenosis. Aorta: Ascending aortic aneurysm up to 48 mm. 53 mm on recent CT imaging. Aortic dilatation noted. There is an aneurysm involving the ascending aorta measuring 48 mm. Venous: The inferior vena cava is normal in size with greater than 50% respiratory variability, suggesting right atrial pressure of 3 mmHg. IAS/Shunts: The atrial septum is grossly normal.  LEFT VENTRICLE PLAX 2D LVIDd:         4.20 cm   Diastology LVIDs:         2.70 cm   LV e' medial:    4.57 cm/s LV PW:         1.00 cm   LV E/e' medial:  15.7 LV IVS:        0.90 cm   LV e' lateral:   5.44 cm/s LVOT diam:     2.20 cm   LV E/e' lateral: 13.2 LV SV:         78 LV SV Index:   38 LVOT Area:     3.80 cm  RIGHT VENTRICLE RV S prime:     10.40 cm/s TAPSE (M-mode): 2.2 cm LEFT ATRIUM             Index        RIGHT ATRIUM           Index LA diam:        4.20 cm 2.04 cm/m   RA Area:     13.00 cm LA Vol (A2C):   56.0 ml 27.19 ml/m  RA Volume:   26.90 ml  13.06 ml/m LA Vol (A4C):   53.3 ml 25.88 ml/m LA Biplane Vol: 55.1 ml 26.75 ml/m  AORTIC VALVE LVOT Vmax:   95.30 cm/s LVOT Vmean:  71.700 cm/s LVOT VTI:    0.204 m  AORTA Ao Root diam: 3.80 cm Ao Asc diam:  4.70 cm MITRAL VALVE MV Area (PHT): 2.05 cm     SHUNTS MV Decel Time: 370 msec     Systemic VTI:  0.20 m MV E velocity: 71.90 cm/s   Systemic Diam: 2.20 cm MV A velocity: 105.00 cm/s MV E/A ratio:  0.68 Lennie Odor MD Electronically signed by Lennie Odor MD Signature Date/Time: 08/23/2022/4:06:58 PM    Final (Updated)    VAS US CAROTID (at [facility] and [facility] only)  Result Date: 08/22/2022 Carotid Arterial Duplex Study Patient  Name:  [facility]  Date of Exam:   08/22/2022 Medical Rec #: [id number]       Accession #:    [id number] Date of Birth: 01-Jun-1945  Patient Gender: F Patient Age:   2 years Exam Location:  [facility] Procedure:      VAS US CAROTID Referring Phys: Jon Billings REGALADO --------------------------------------------------------------------------------  Indications:       CVA. Risk Factors:      Hyperlipidemia. Limitations        Today's exam was limited due to the body habitus of the                    patient and the patient's respiratory variation. Comparison Study:  No prior studies. Performing Technologist: Chanda Busing RVT  Examination Guidelines: A complete evaluation includes B-mode imaging, spectral Doppler, color Doppler, and power Doppler as needed of all accessible portions of each vessel. Bilateral testing is considered an integral part of a complete examination. Limited examinations for reoccurring indications may be performed as noted.  Right Carotid Findings: +----------+--------+--------+--------+-----------------------+--------+           PSV cm/sEDV cm/sStenosisPlaque Description     Comments +----------+--------+--------+--------+-----------------------+--------+ CCA Prox  53      8               smooth and heterogenous         +----------+--------+--------+--------+-----------------------+--------+ CCA Distal61      10              smooth and heterogenous         +----------+--------+--------+--------+-----------------------+--------+ ICA Prox  53      9               smooth and heterogenoustortuous +----------+--------+--------+--------+-----------------------+--------+ ICA Mid   48      10                                     tortuous +----------+--------+--------+--------+-----------------------+--------+ ICA Distal51      12                                     tortuous +----------+--------+--------+--------+-----------------------+--------+  ECA       76      10                                              +----------+--------+--------+--------+-----------------------+--------+ +----------+--------+-------+--------+-------------------+           PSV cm/sEDV cmsDescribeArm Pressure (mmHG) +----------+--------+-------+--------+-------------------+ [id number]                                        +----------+--------+-------+--------+-------------------+ +---------+--------+--+--------+-+---------+ VertebralPSV cm/s32EDV cm/s7Antegrade +---------+--------+--+--------+-+---------+  Left Carotid Findings: +----------+--------+--------+--------+-----------------------+--------+           PSV cm/sEDV cm/sStenosisPlaque Description     Comments +----------+--------+--------+--------+-----------------------+--------+ CCA Prox  73      16              smooth and heterogenous         +----------+--------+--------+--------+-----------------------+--------+ CCA Distal61      12              smooth and heterogenous         +----------+--------+--------+--------+-----------------------+--------+ ICA Prox  66      20  smooth and heterogenoustortuous +----------+--------+--------+--------+-----------------------+--------+ ICA Mid   56      15                                     tortuous +----------+--------+--------+--------+-----------------------+--------+ ICA Distal48      15                                     tortuous +----------+--------+--------+--------+-----------------------+--------+ ECA       204     20                                              +----------+--------+--------+--------+-----------------------+--------+ +----------+--------+--------+--------+-------------------+           PSV cm/sEDV cm/sDescribeArm Pressure (mmHG) +----------+--------+--------+--------+-------------------+ [id number]                                          +----------+--------+--------+--------+-------------------+ +---------+--------+--+--------+--+---------+ VertebralPSV cm/s45EDV cm/s10Antegrade +---------+--------+--+--------+--+---------+   Summary: Right Carotid: Velocities in the right ICA are consistent with a 1-39% stenosis. Left Carotid: Velocities in the left ICA are consistent with a 1-39% stenosis. Vertebrals: Bilateral vertebral arteries demonstrate antegrade flow. *See table(s) above for measurements and observations.  Electronically signed by Coral Else MD on 08/22/2022 at 10:46:13 PM.    Final    CT ANGIO HEAD NECK W WO CM  Result Date: 08/22/2022 CLINICAL DATA:  Neuro deficit, acute, stroke suspected EXAM: CT ANGIOGRAPHY HEAD AND NECK WITH AND WITHOUT CONTRAST TECHNIQUE: Multidetector CT imaging of the head and neck was performed using the standard protocol during bolus administration of intravenous contrast. Multiplanar CT image reconstructions and MIPs were obtained to evaluate the vascular anatomy. Carotid stenosis measurements (when applicable) are obtained utilizing NASCET criteria, using the distal internal carotid diameter as the denominator. RADIATION DOSE REDUCTION: This exam was performed according to the departmental dose-optimization program which includes automated exposure control, adjustment of the mA and/or kV according to patient size and/or use of iterative reconstruction technique. CONTRAST:  80mL OMNIPAQUE IOHEXOL 350 MG/ML SOLN COMPARISON:  None Available. FINDINGS: CT HEAD FINDINGS Brain: Known acute infarcts the cerebellum better characterized on same day MRI. No acute hemorrhage or progressive mass effect. Additional remote infarcts and chronic microvascular ischemic disease. Cerebral atrophy. No hydrocephalus or most loose improved Vascular: See below. Skull: No acute fracture. Sinuses/Orbits: Clear sinuses.  No acute orbital findings. Other: No mastoid effusions. Review of the MIP images confirms the above  findings CTA NECK FINDINGS Aortic arch: Partially imaged ascending aortic aneurysm better characterized on recent CTA chest. Great vessel origins are patent without significant stenosis. Right carotid system: Atherosclerosis involving the proximal ICA with approximately 50% stenosis. Left carotid system: Atherosclerosis involving the proximal ICA without greater than 50% stenosis. Vertebral arteries: Left dominant. No evidence of hemodynamically significant stenosis in the neck. Skeleton: No acute abnormality. Other neck: No acute abnormality. Upper chest: Visualized lung apices are clear. Review of the MIP images confirms the above findings CTA HEAD FINDINGS Anterior circulation: Right A1 ACA is hypoplastic bilateral intracranial ICAs, MCAs, and ACAs are patent. Moderate right M1 MCA stenosis. Posterior circulation: Moderate proximal and severe distal right intradural  vertebral artery stenosis. Mild left intradural vertebral artery stenosis. Severe (nearly occlusive) medium distance stenosis of the mid basilar artery with suggestion of eccentric low-density and distal tapering. The posterior cerebral arteries are patent Venous sinuses: As permitted by contrast timing, patent. Review of the MIP images confirms the above findings IMPRESSION: 1. Severe (nearly occlusive) basilar artery stenosis with an appearance that could reflect dissection (particularly given significant progression since 2015 MRI), although atherosclerotic disease could have a similar appearance. 2. Moderate right M1 MCA stenosis. 3. Approximately 50% stenosis of the proximal right ICA in the neck. 4. Partially imaged ascending aortic aneurysm better characterized on recent CTA chest. Electronically Signed   By: Feliberto Harts M.D.   On: 08/22/2022 14:59   MR ANGIO HEAD WO CONTRAST  Addendum Date: 08/22/2022   ADDENDUM REPORT: 08/22/2022 13:22 ADDENDUM: These results were called by telephone at the time of interpretation on 08/22/2022 at  12:25 pm to provider [facility] , who verbally acknowledged these results. Electronically Signed   By: Orvan Falconer M.D.   On: 08/22/2022 13:22   Result Date: 08/22/2022 CLINICAL DATA:  Neuro deficit, acute, stroke suspected. EXAM: MRA HEAD WITHOUT CONTRAST TECHNIQUE: Angiographic images of the Circle of Willis were acquired using MRA technique without intravenous contrast. COMPARISON:  MRI brain 08/22/2022. FINDINGS: Moderately motion degraded study.  Within this limitation: Anterior circulation: Intracranial ICAs are patent without significant stenosis. The proximal ACAs and MCAs are patent without significant stenosis. Distal branches are symmetric. Posterior circulation: Severe stenosis of the mid basilar artery with proximal tapering, suspicious for dissection versus thrombus. The SCAs and PICAs are patent proximally. The AICAs are not well visualized, possibly secondary to motion artifact. The PCAs are patent proximally without significant stenosis. Distal branches are symmetric. Anatomic variants: Persistent fetal origin of the left PCA with hypoplastic left P1 segment. Other: None. IMPRESSION: Moderately motion degraded study. Within this limitation, severe stenosis of the mid basilar artery with proximal tapering, suspicious for dissection versus thrombus. Contrast-enhanced vessel imaging may be helpful in distinguishing the etiology of this stenosis. Radiology assistant personnel have been notified to put me in telephone contact with the referring physician or the referring physician's clinical representative in order to discuss these findings. Once this communication is established I will issue an addendum to this report for documentation purposes. Electronically Signed: By: Orvan Falconer M.D. On: 08/22/2022 12:14   MR BRAIN WO CONTRAST  Result Date: 08/22/2022 CLINICAL DATA:  77 year old female with syncope. Altered mental status. Weakness and dizziness. Vomiting. EXAM: MRI HEAD WITHOUT  CONTRAST TECHNIQUE: Multiplanar, multiecho pulse sequences of the brain and surrounding structures were obtained without intravenous contrast. COMPARISON:  CT head and cervical spine yesterday. Previous brain MRI 08/23/2013. FINDINGS: Brain: Patchy, confluent restricted diffusion in the right cerebellum SCA territory. Small areas of acute ischemia involving the midline cerebellar vermis (series 11, image 11). And small patchy areas of acute infarct also in the right occipital pole, cortex along the right parieto-occipital sulcus. Right thalamus is spared. No brainstem restricted diffusion. No anterior circulation restricted diffusion. Associated cytotoxic edema most pronounced in the right cerebellum SCA territory. No hemorrhagic transformation. No mass effect. Chronic encephalomalacia in the posterior right MCA territory is new since 2015 with mild associated hemosiderin. Widespread patchy bilateral cerebral white matter T2 and FLAIR hyperintensity has progressed since the previous MRI also. Chronic lacunar infarct of the left thalamus is new since 2015. Comparatively mild basal ganglia and pontine T2 heterogeneity. No midline shift, mass effect, evidence of  mass lesion, ventriculomegaly, extra-axial collection or acute intracranial hemorrhage. Cervicomedullary junction and pituitary are within normal limits. Vascular: Major intracranial vascular flow voids are stable since 2015. Chronic intracranial artery tortuosity, including vertebrobasilar dolichoectasia. Skull and upper cervical spine: C3-C4 acquired or congenital ankylosis. Otherwise negative for age visible cervical spine. Visualized bone marrow signal is within normal limits. Sinuses/Orbits: Postoperative changes to both globes since 2015. Paranasal Visualized paranasal sinuses and mastoids are stable and well aerated. Other: Visible internal auditory structures appear normal. Negative visible scalp and face. IMPRESSION: 1. Positive for acute infarcts in  the Right cerebellum (right SCA territory and vermis), and mildly scattered in the right PCA territory. No hemorrhagic transformation or mass effect. 2. Chronic posterior right MCA infarct is new since the 2015 brain MRI, and moderately progressed other chronic small vessel disease since that time. Electronically Signed   By: Odessa Fleming M.D.   On: 08/22/2022 06:54   CT Angio Chest/Abd/Pel for Dissection W and/or Wo Contrast  Result Date: 08/21/2022 CLINICAL DATA:  Acute aortic syndrome suspected. EXAM: CT ANGIOGRAPHY CHEST, ABDOMEN AND PELVIS TECHNIQUE: Non-contrast CT of the chest was initially obtained. Multidetector CT imaging through the chest, abdomen and pelvis was performed using the standard protocol during bolus administration of intravenous contrast. Multiplanar reconstructed images and MIPs were obtained and reviewed to evaluate the vascular anatomy. RADIATION DOSE REDUCTION: This exam was performed according to the departmental dose-optimization program which includes automated exposure control, adjustment of the mA and/or kV according to patient size and/or use of iterative reconstruction technique. CONTRAST:  OMNIPAQUE IOHEXOL 350 MG/ML SOLN COMPARISON:  Chest CT dated 11/06/2017 and CT abdomen pelvis dated 08/03/2010. FINDINGS: Evaluation of this exam is limited due to respiratory motion artifact. CTA CHEST FINDINGS Cardiovascular: There is no cardiomegaly or pericardial effusion. Coronary vascular calcification predominately involving the LAD and RCA. Similar appearance of ascending aortic aneurysm measuring up to 5.3 cm. No aortic dissection. There is moderate atherosclerotic calcification of the thoracic aorta. The origins of the great vessels of the aortic arch appear patent. Evaluation of the pulmonary arteries is limited due to respiratory motion. No central pulmonary artery embolus identified. Mediastinum/Nodes: No hilar or mediastinal adenopathy. The esophagus is grossly unremarkable.  No mediastinal fluid collection. Lungs/Pleura: Faint 2 mm left apical nodule (19/9) similar to prior CT. Mild diffuse interstitial coarsening and areas of air trapping may be related to underlying small vessel versus small airway disease. A 3 cm focal subpleural nodular consolidation in the right middle lobe may represent atelectasis/scarring. Developing infiltrate or an area of infarct are not excluded. Attention on follow-up imaging recommended. There is no pleural effusion or pneumothorax. The central airways are patent. Musculoskeletal: Osteopenia with degenerative changes of the spine. No acute osseous pathology. Review of the MIP images confirms the above findings. CTA ABDOMEN AND PELVIS FINDINGS VASCULAR Aorta: Advanced atherosclerotic calcification of the abdominal aorta. No aneurysmal dilatation or dissection. No periaortic fluid collection. Celiac: The celiac trunk and its major branches are patent. No aneurysmal dilatation or dissection. SMA: The SMA is patent.  No aneurysmal dilatation or dissection. Renals: The renal arteries are patent.  No aneurysm. IMA: The IMA is patent. Inflow: Moderate atherosclerotic calcification of the iliac arteries. No aneurysmal dilatation or dissection. The iliac arteries are patent bilaterally. Veins: No obvious venous abnormality within the limitations of this arterial phase study. Review of the MIP images confirms the above findings. NON-VASCULAR No intra-abdominal free air or free fluid. Hepatobiliary: There is irregularity of the liver contour  suggestive of cirrhosis. Clinical correlation is recommended. No biliary dilatation. Cholecystectomy. Pancreas: Unremarkable. No pancreatic ductal dilatation or surrounding inflammatory changes. Spleen: Normal in size without focal abnormality. Adrenals/Urinary Tract: The adrenal glands unremarkable. There is no hydronephrosis on either side. Symmetric excretion of contrast by both kidneys. The visualized ureters and urinary  bladder appear unremarkable. Stomach/Bowel: There is large amount of stool throughout the colon. No bowel obstruction or active inflammation. The appendix is not visualized with certainty. No inflammatory changes identified in the right lower quadrant. Lymphatic: No adenopathy. Reproductive: The uterus is anteverted and grossly unremarkable. No adnexal masses. Other: None Musculoskeletal: Osteopenia with degenerative changes of the spine. Grade 1 L4-L5 anterolisthesis. L2 hemangioma. No acute osseous pathology. Review of the MIP images confirms the above findings. IMPRESSION: 1. No acute intrathoracic, abdominal, or pelvic pathology. 2. Similar appearance of ascending aortic aneurysm measuring up to 5.3 cm. Ascending thoracic aortic aneurysm. Recommend semi-annual imaging followup by CTA or MRA and referral to cardiothoracic surgery if not already obtained. This recommendation follows 2010 ACCF/AHA/AATS/ACR/ASA/SCA/SCAI/SIR/STS/SVM Guidelines for the Diagnosis and Management of Patients With Thoracic Aortic Disease. Circulation. 2010; 121: Z610-R604. Aortic aneurysm NOS (ICD10-I71.9). No aortic dissection. 3. A 3 cm focal subpleural nodular consolidation in the right middle lobe may represent atelectasis/scarring. Developing infiltrate or an area of infarct are not excluded. Attention on follow-up imaging recommended. 4. Cirrhosis. 5. Constipation. No bowel obstruction. 6.  Aortic Atherosclerosis (ICD10-I70.0). Electronically Signed   By: Elgie Collard M.D.   On: 08/21/2022 18:37   CT Head Wo Contrast  Result Date: 08/21/2022 CLINICAL DATA:  Mental status change, unknown cause; Polytrauma, blunt EXAM: CT HEAD WITHOUT CONTRAST CT CERVICAL SPINE WITHOUT CONTRAST TECHNIQUE: Multidetector CT imaging of the head and cervical spine was performed following the standard protocol without intravenous contrast. Multiplanar CT image reconstructions of the cervical spine were also generated. RADIATION DOSE REDUCTION: This  exam was performed according to the departmental dose-optimization program which includes automated exposure control, adjustment of the mA and/or kV according to patient size and/or use of iterative reconstruction technique. COMPARISON:  CT Chest 11/06/17 FINDINGS: CT HEAD FINDINGS Brain: Chronic infarct in the right parietal temporal region sequela of moderate microvascular ischemic change. No CT evidence of an acute cortical infarct. No hemorrhage. No hydrocephalus. No extra-axial fluid collection generalized volume loss Vascular: No hyperdense vessel or unexpected calcification. Skull: Normal. Negative for fracture or focal lesion. Sinuses/Orbits: No middle ear or mastoid effusion air-fluid level in the right sphenoid sinus orbits are notable for bilateral lens replacement, otherwise unremarkable. Other: None. CT CERVICAL SPINE FINDINGS Alignment: Straightening of the normal cervical lordosis. Skull base and vertebrae: No acute cervical spine fracture. No primary bone lesion or focal pathologic process. Likely an osseous hemangioma at T2. possible acute fracture of the left T2 transverse process (series 4, image 26) Soft tissues and spinal canal: No prevertebral fluid or swelling. No visible canal hematoma. Disc levels:  No evidence of high-grade spinal canal stenosis Upper chest: Biapical ground-glass opacities are favored to represent infectious or inflammatory process. Other: None IMPRESSION: 1. No acute intracranial abnormality. Chronic infarct in the right parietotemporal region. 2. No acute cervical spine fracture. Possible acute fracture of the left T2 transverse process. 3. Biapical ground-glass opacities are favored to represent infectious or inflammatory process. Electronically Signed   By: Lorenza Cambridge M.D.   On: 08/21/2022 14:23   CT Cervical Spine Wo Contrast  Result Date: 08/21/2022 CLINICAL DATA:  Mental status change, unknown cause; Polytrauma, blunt EXAM: CT  HEAD WITHOUT CONTRAST CT CERVICAL  SPINE WITHOUT CONTRAST TECHNIQUE: Multidetector CT imaging of the head and cervical spine was performed following the standard protocol without intravenous contrast. Multiplanar CT image reconstructions of the cervical spine were also generated. RADIATION DOSE REDUCTION: This exam was performed according to the departmental dose-optimization program which includes automated exposure control, adjustment of the mA and/or kV according to patient size and/or use of iterative reconstruction technique. COMPARISON:  CT Chest 11/06/17 FINDINGS: CT HEAD FINDINGS Brain: Chronic infarct in the right parietal temporal region sequela of moderate microvascular ischemic change. No CT evidence of an acute cortical infarct. No hemorrhage. No hydrocephalus. No extra-axial fluid collection generalized volume loss Vascular: No hyperdense vessel or unexpected calcification. Skull: Normal. Negative for fracture or focal lesion. Sinuses/Orbits: No middle ear or mastoid effusion air-fluid level in the right sphenoid sinus orbits are notable for bilateral lens replacement, otherwise unremarkable. Other: None. CT CERVICAL SPINE FINDINGS Alignment: Straightening of the normal cervical lordosis. Skull base and vertebrae: No acute cervical spine fracture. No primary bone lesion or focal pathologic process. Likely an osseous hemangioma at T2. possible acute fracture of the left T2 transverse process (series 4, image 26) Soft tissues and spinal canal: No prevertebral fluid or swelling. No visible canal hematoma. Disc levels:  No evidence of high-grade spinal canal stenosis Upper chest: Biapical ground-glass opacities are favored to represent infectious or inflammatory process. Other: None IMPRESSION: 1. No acute intracranial abnormality. Chronic infarct in the right parietotemporal region. 2. No acute cervical spine fracture. Possible acute fracture of the left T2 transverse process. 3. Biapical ground-glass opacities are favored to represent  infectious or inflammatory process. Electronically Signed   By: Lorenza Cambridge M.D.   On: 08/21/2022 14:23   DG Chest Portable 1 View  Result Date: 08/21/2022 CLINICAL DATA:  Weakness.  Dizziness.  Known aneurysm EXAM: PORTABLE CHEST 1 VIEW COMPARISON:  X-ray 10/27/2017. FINDINGS: Increasing widening of the mediastinum. Borderline cardiopericardial silhouette. No consolidation, pneumothorax or effusion. No edema. Films are under penetrated. Overlapping cardiac leads. IMPRESSION: Increasing widening of the mediastinum. With patient's history recommend further workup with chest CT when clinically appropriate. Electronically Signed   By: Karen Kays M.D.   On: 08/21/2022 14:02    Today   Subjective    Jenny Acosta today has no headache,no chest abdominal pain,no new weakness tingling or numbness, feels much better wants to go home today.    Objective   Blood pressure 136/74, pulse 91, temperature 97.8 F (36.6 C), temperature source Oral, resp. rate 17, height 5\' 5"  (1.651 m), weight 107.5 kg, SpO2 94 %.   Intake/Output Summary (Last 24 hours) at 08/27/2022 0908 Last data filed at 08/27/2022 0400 Gross per 24 hour  Intake 680 ml  Output --  Net 680 ml    Exam  Awake Alert, No new F.N deficits,    [place].AT,PERRAL Supple Neck,   Symmetrical Chest wall movement, Good air movement bilaterally, CTAB RRR,No Gallops,   +ve B.Sounds, Abd Soft, Non tender,  No Cyanosis, Clubbing or edema    Data Review   Recent Labs  Lab 08/21/22 1314 08/22/22 0113 08/27/22 0343  WBC 9.9 9.8 10.3  HGB 15.1* 15.2* 14.0  HCT 47.6* 47.5* 43.0  PLT 238 253 233  MCV 89.1 88.5 88.5  MCH 28.3 28.3 28.8  MCHC 31.7 32.0 32.6  RDW 14.5 14.3 14.4  LYMPHSABS 2.3 3.3 4.7*  MONOABS 0.7 1.0 0.8  EOSABS 0.1 0.0 0.2  BASOSABS 0.0 0.0 0.0  Recent Labs  Lab 08/21/22 1314 08/21/22 1329 08/22/22 0113 08/23/22 0321 08/23/22 0904 08/27/22 0343  NA 140  --  136  --   --  138  K 3.6  --  3.7  --   --  3.7   CL 101  --  101  --   --  103  CO2 26  --  23  --   --  24  ANIONGAP 13  --  12  --   --  11  GLUCOSE 125*  --  110*  --   --  118*  BUN 14  --  10  --   --  11  CREATININE 0.80  --  0.58  --   --  0.68  AST 28  --  29  --   --   --   ALT 27  --  27  --   --   --   ALKPHOS 118  --  116  --   --   --   BILITOT 0.6  --  0.7  --   --   --   ALBUMIN 4.1  --  4.0  --   --   --   PROCALCITON  --   --  <0.10  --   --   --   TSH  --   --  0.538  --   --   --   HGBA1C  --   --   --    < > 6.3*  --   AMMONIA  --  13  --   --   --   --   MG 2.3  --  2.4  --   --  2.3  CALCIUM 9.1  --  8.9  --   --  9.1   < > = values in this interval not displayed.    Total Time in preparing paper work, data evaluation and todays exam - 35 minutes  Signature  -    Susa Raring M.D on 08/27/2022 at 9:08 AM   -  To page go to [email or web address]

## 2022-08-27 NOTE — Progress Notes (Signed)
Patient states  she is a vegetarian she will need a soft diet, due to her not having many teeth and bad oral issues, she is  requesting po supplemented, requesting to see dietitian due to liver issues and selective meal plans.

## 2022-08-27 NOTE — H&P (Signed)
Physical Medicine and Rehabilitation Admission H&P  CC: CVA  HPI: Jenny Acosta is a 77 year old right-handed female with history significant for carotid stenosis, ascending thoracic aortic aneurysm, hyperlipidemia, hypothyroidism, hypertension.  Per chart review patient lives alone independent living facility.  Ambulates with a rollator for community distances.  Reports 1 fall in the past 6 months.  She does not drive and has groceries delivered.  She does have a daughter in the area to provide assistance.  Presented 08/21/2022 with weakness, dizziness sustaining a fall.  Patient had developed some lightheadedness and her legs gave out.  Denied loss of consciousness.  She was able to get up on her own.  CT/MRI positive for acute infarction in the right cerebellum (right SCA territory and vermis), and mildly scattered in the right PCA territory.  Chronic posterior right MCA infarct new since the 2015 brain MRI.  CTA of head and neck severe nearly occlusive basilar artery stenosis with an appearance that could reflect possible dissection particularly given progression since 2015.  Moderate right M1 MCA stenosis.  Approximately 50% stenosis of the proximal right ICA in the neck.  Partially imaged ascending aortic aneurysm.  Scans were reviewed by neurology dissection was doubtful.  CT thoracic spine no fracture seen.  Admission chemistries unremarkable except glucose 125, troponin negative.  Echocardiogram with ejection fraction of 60 to 65% no wall motion abnormalities grade 1 diastolic dysfunction.  Currently maintained on aspirin 81 mg daily and Plavix 75 mg daily.  Considering Brilinta if she was able to afford.  Subcutaneous heparin added for DVT prophylaxis.  Patient did complete a course of Augmentin for question aspiration pneumonia in the setting of vomiting episode and currently remains afebrile.  Tolerating a regular consistency diet.  Therapy evaluations completed due to patient decreased  functional mobility was admitted for a comprehensive rehab program.  Review of Systems  Constitutional:  Negative for chills and fever.  HENT:  Negative for hearing loss.   Eyes:  Negative for blurred vision and double vision.  Respiratory:  Negative for cough, shortness of breath and wheezing.   Cardiovascular:  Negative for chest pain, palpitations and leg swelling.  Gastrointestinal:  Positive for constipation. Negative for heartburn, nausea and vomiting.       GERD  Genitourinary:  Negative for dysuria, flank pain and urgency.  Musculoskeletal:  Positive for falls.  Skin:  Negative for rash.  Neurological:  Positive for dizziness, weakness and headaches.  Psychiatric/Behavioral:  Positive for depression.   All other systems reviewed and are negative.  Past Medical History:  Diagnosis Date   Allergic rhinitis, cause unspecified    Arthritis    Carotid stenosis    Family history of colon cancer 07/21/2013   mother   Gallstone    GERD (gastroesophageal reflux disease)    Hyperlipidemia 08/30/2010   Hypertension    Hypothyroidism    Impaired glucose tolerance    Pain, coccyx    Right lumbar radiculopathy 08/30/2010   Past Surgical History:  Procedure Laterality Date   APPENDECTOMY  1953   CHOLECYSTECTOMY  2002   CHOLECYSTECTOMY OPEN  10 years ago   scarlet fever     age 69   TONSILLECTOMY  78   Family History  Problem Relation Age of Onset   Colon cancer Mother    Stroke Mother    Arthritis Sister    Atrial fibrillation Sister        has pacemaker   Diabetes Maternal Grandmother    Diabetes  Daughter    Stomach cancer Neg Hx    Social History:  reports that she has never smoked. She has never used smokeless tobacco. She reports that she does not drink alcohol and does not use drugs. Allergies:  Allergies  Allergen Reactions   Lipitor [Atorvastatin]     myalgia   Methocarbamol Other (See Comments)    hallucinations   Simvastatin     Hair loss   Sulfa  Antibiotics     unknown   Medications Prior to Admission  Medication Sig Dispense Refill   acetaminophen (TYLENOL) 500 MG tablet Take 500 mg by mouth every 6 (six) hours as needed. For pain     albuterol (VENTOLIN HFA) 108 (90 Base) MCG/ACT inhaler TAKE 2 PUFFS BY MOUTH EVERY 6 HOURS AS NEEDED FOR WHEEZE OR SHORTNESS OF BREATH (Patient taking differently: Inhale 2 puffs into the lungs every 6 (six) hours as needed for wheezing or shortness of breath.) 8.5 each 1   Cholecalciferol (THERA-D 2000) 50 MCG (2000 UT) TABS 1 tab by mouth epr day (Patient taking differently: Take 1 tablet by mouth daily.) 30 tablet 99   DHA-EPA-Vitamin E (OMEGA-3 COMPLEX PO) Take by mouth.     guaiFENesin (MUCUS RELIEF ADULT PO) Take by mouth.     levothyroxine (SYNTHROID) 100 MCG tablet Take 100 mcg by mouth daily before breakfast.     nortriptyline (PAMELOR) 10 MG capsule 2 TAB BY MOUTH ONCE IN THE AM, THEN 1-3 TABS BY MOUTH AT BEDTIME AS NEEDED FOR PAIN AND SLEEP (Patient taking differently: Take 10 mg by mouth at bedtime as needed for sleep. 2 capsules in the AM and 1-3 at bedtime as needed for pain and sleep) 450 capsule 1   ondansetron (ZOFRAN) 4 MG tablet TAKE 1 TABLET BY MOUTH EVERY 8 HOURS AS NEEDED FOR NAUSEA AND VOMITING 30 tablet 0   pantoprazole (PROTONIX) 40 MG tablet TAKE 1 TABLET BY MOUTH EVERY DAY (Patient taking differently: Take 40 mg by mouth daily.) 90 tablet 2   PARoxetine (PAXIL) 30 MG tablet TAKE 1 TABLET BY MOUTH EVERY DAY 90 tablet 2   tiZANidine (ZANAFLEX) 4 MG tablet TAKE 1 TABLET BY MOUTH THREE TIMES A DAY AS NEEDED 270 tablet 3   traMADol (ULTRAM) 50 MG tablet Take 1 tablet (50 mg total) by mouth every 6 (six) hours as needed. 120 tablet 5   Triprolidine-Pseudoephedrine (ANTIHISTAMINE PO) Take by mouth.     HYDROcodone bit-homatropine (HYCODAN) 5-1.5 MG/5ML syrup Take 5 mLs by mouth every 6 (six) hours as needed for cough. (Patient not taking: Reported on 08/21/2022)     levothyroxine  (SYNTHROID) 100 MCG tablet Take 1 tablet (100 mcg total) by mouth daily. (Patient not taking: Reported on 08/21/2022) 90 tablet 3   rosuvastatin (CRESTOR) 40 MG tablet Take 1 tablet (40 mg total) by mouth daily. (Patient not taking: Reported on 08/21/2022) 90 tablet 3   triamcinolone (NASACORT) 55 MCG/ACT AERO nasal inhaler Place 2 sprays into the nose daily. (Patient not taking: Reported on 08/21/2022) 1 Inhaler 12    Home: Home Living Family/patient expects to be discharged to:: Private residence Living Arrangements: Alone Available Help at Discharge: Family, Available 24 hours/day Type of Home: Independent living facility Home Access: Level entry Home Layout: One level Bathroom Shower/Tub: Health visitor: Handicapped height Bathroom Accessibility: Yes Home Equipment: Rollator (4 wheels), Cane - single point, Shower seat - built in, [employer] - tub/shower Additional Comments: If goes to daughter's she has 4-5 steps  to enter.   Functional History: Prior Function Prior Level of Function : Independent/Modified Independent, History of Falls (last six months) Mobility Comments: Walks with rollator for community distances; reports 1 fall in past 6 months ADLs Comments: Only does showers if daughter there for supervision, otherwise sponge bathes.  Pt able to dress and toilet on her own. Does not drive and has groceries delivered.  Pt fixes light meals.   Functional Status:  Mobility: Bed Mobility Overal bed mobility: Needs Assistance Bed Mobility: Supine to Sit Supine to sit: Min guard, HOB elevated Sit to supine: Min guard, HOB elevated General bed mobility comments: HOB elevated, increased time, min cues for safety and technique. Pt seated EOB at end of session Transfers Overall transfer level: Needs assistance Equipment used: Rolling walker (2 wheels) Transfers: Sit to/from Stand Sit to Stand: Min assist, Min guard Bed to/from chair/wheelchair/BSC transfer type:: Step  pivot Step pivot transfers: Min assist General transfer comment: minA to power up with first stand, progressing to minG with 2 other trials, requiring cueing with each trial for proper hand placement Ambulation/Gait Ambulation/Gait assistance: Min guard, Min assist Gait Distance (Feet): 60 Feet Assistive device: Rolling walker (2 wheels) Gait Pattern/deviations: Decreased stride length, Trunk flexed, Wide base of support, Step-through pattern, Staggering left, Decreased step length - left, Decreased step length - right, Drifts right/left General Gait Details: minG to minA throughout ambulation, noted decrease in gait speed with very cautious gait. Initially with a L veer but intermittent B path deviation, noted ~3 losses of balance with minA provided. Moderate cueing provided for safety with RW management and cueing for turning. Gait velocity: decreased   ADL: ADL Overall ADL's : Needs assistance/impaired Eating/Feeding: Modified independent, Sitting Grooming: Wash/dry face, Oral care, Min guard, Standing Grooming Details (indicate cue type and reason): washed hands/face while standing sinkside with BUEs offloaded from RW, min guard A for stability Upper Body Bathing: Set up, Sitting Lower Body Bathing: Minimal assistance, Sit to/from stand Upper Body Dressing : Set up, Sitting Lower Body Dressing: Minimal assistance, Sit to/from stand Lower Body Dressing Details (indicate cue type and reason): pt demonstrated figure four positioning to simulate LB dressing Toilet Transfer: Minimal assistance, Ambulation, Comfort height toilet, Rolling walker (2 wheels) Toilet Transfer Details (indicate cue type and reason): BSC placed over toilet Toileting- Clothing Manipulation and Hygiene: Min guard, Sit to/from stand Toileting - Clothing Manipulation Details (indicate cue type and reason): min guard A for stability in standing during pericare Functional mobility during ADLs: Minimal assistance, Rolling  walker (2 wheels) General ADL Comments: Limited secondary to dizziness, fatigue, and generalized weakness   Cognition: Cognition Overall Cognitive Status: No family/caregiver present to determine baseline cognitive functioning Arousal/Alertness: Awake/alert Orientation Level: Oriented X4 Memory: Appears intact Awareness: Appears intact Problem Solving: Appears intact Safety/Judgment: Appears intact Cognition Arousal/Alertness: Awake/alert Behavior During Therapy: WFL for tasks assessed/performed Overall Cognitive Status: No family/caregiver present to determine baseline cognitive functioning General Comments: Pleasant and cooperative, benefits from simple 1 step commands, continues to require mod cueing for RW management around obstacles and with turns    Physical Exam: There were no vitals taken for this visit. Gen: no distress, normal appearing HEENT: oral mucosa pink and moist, NCAT Cardio: Reg rate Chest: normal effort, normal rate of breathing Abd: soft, non-distended Ext: no edema Psych: pleasant, normal affect Skin: intact Neurological:     Comments: Patient is alert sitting up in bed.  Makes eye contact with examiner follows commands.  Fair insight and awareness. 5/5 strength,  sensation intact  Results for orders placed or performed during the hospital encounter of 08/21/22 (from the past 48 hour(s))  Magnesium     Status: None   Collection Time: 08/27/22  3:43 AM  Result Value Ref Range   Magnesium 2.3 1.7 - 2.4 mg/dL    Comment: Performed at [facility], [address], [place], [place] [address]  Basic metabolic panel     Status: Abnormal   Collection Time: 08/27/22  3:43 AM  Result Value Ref Range   Sodium 138 135 - 145 mmol/L   Potassium 3.7 3.5 - 5.1 mmol/L   Chloride 103 98 - 111 mmol/L   CO2 24 22 - 32 mmol/L   Glucose, Bld 118 (H) 70 - 99 mg/dL    Comment: Glucose reference range applies only to samples taken after fasting for at least 8 hours.    BUN 11 8 - 23 mg/dL   Creatinine, Ser 6.04 0.44 - 1.00 mg/dL   Calcium 9.1 8.9 - 54.0 mg/dL   GFR, Estimated >98 >11 mL/min    Comment: (NOTE) Calculated using the CKD-EPI Creatinine Equation (2021)    Anion gap 11 5 - 15    Comment: Performed at [facility], [address] [place], [place] [address]  CBC with Differential/Platelet     Status: Abnormal   Collection Time: 08/27/22  3:43 AM  Result Value Ref Range   WBC 10.3 4.0 - 10.5 K/uL   RBC 4.86 3.87 - 5.11 MIL/uL   Hemoglobin 14.0 12.0 - 15.0 g/dL   HCT 29.5 62.1 - 30.8 %   MCV 88.5 80.0 - 100.0 fL   MCH 28.8 26.0 - 34.0 pg   MCHC 32.6 30.0 - 36.0 g/dL   RDW 65.7 84.6 - 96.2 %   Platelets 233 150 - 400 K/uL   nRBC 0.0 0.0 - 0.2 %   Neutrophils Relative % 45 %   Neutro Abs 4.6 1.7 - 7.7 K/uL   Lymphocytes Relative 45 %   Lymphs Abs 4.7 (H) 0.7 - 4.0 K/uL   Monocytes Relative 8 %   Monocytes Absolute 0.8 0.1 - 1.0 K/uL   Eosinophils Relative 2 %   Eosinophils Absolute 0.2 0.0 - 0.5 K/uL   Basophils Relative 0 %   Basophils Absolute 0.0 0.0 - 0.1 K/uL   Immature Granulocytes 0 %   Abs Immature Granulocytes 0.01 0.00 - 0.07 K/uL    Comment: Performed at [facility], [address] [place], [place] [address]   No results found.    There were no vitals taken for this visit.  Medical Problem List and Plan: 1. Functional deficits secondary to right occipital and cerebellum infarctions/severe basilar artery stenosis likely from atherosclerosis   -patient may shower  -ELOS/Goals: 5-7 days    Admit to CIR 2.  Antithrombotics: -DVT/anticoagulation:  Pharmaceutical: Heparin  -antiplatelet therapy: Aspirin 81 mg daily and Plavix 75 mg daily.  Considering Brilinta if patient can afford. 3. Pain Management: Tramadol as needed 4. Mood/Behavior/Sleep: Pamelor 10 mg twice daily, Paxil 30 mg daily.  Provide emotional support  -antipsychotic agents: N/A 5. Neuropsych/cognition: This patient is capable of making  decisions on her own behalf. 6. Skin/Wound Care: Routine skin checks 7. Fluids/Electrolytes/Nutrition: Routine in and outs with follow-up chemistries 8.  Hyperlipidemia.  Continue Crestor 9.  Hypothyroidism.  Continue Synthroid 10.  Liver cirrhosis.  Seen incidentally on CT abdomen.  Follow-up outpatient. 11.  Ascending aortic aneurysm measuring up to  5.3 cm.  Follow-up outpatient vascular 12.  3 mm focal subpleural nodular consolidation right middle lobe.  Repeat imaging 4 to 6 weeks 13.  Obesity.  BMI 39.44.  Dietary follow-up 14.  GERD.  Continue Protonix  I have personally performed a face to face diagnostic evaluation, including, but not limited to relevant history and physical exam findings, of this patient and developed relevant assessment and plan.  Additionally, I have reviewed and concur with the physician assistant's documentation above.  Mcarthur Rossetti Angiulli, PA-C   Horton Chin, MD 08/27/2022

## 2022-08-27 NOTE — Progress Notes (Signed)
Mobility Specialist Progress Note   08/27/22 1102  Mobility  Activity Ambulated with assistance in hallway  Level of Assistance Minimal assist, patient does 75% or more  Assistive Device Front wheel walker  Distance Ambulated (ft) 75 ft  Activity Response Tolerated well  Mobility Referral Yes  $Mobility charge 1 Mobility  Mobility Specialist Start Time (ACUTE ONLY) 1030  Mobility Specialist Stop Time (ACUTE ONLY) 1101  Mobility Specialist Time Calculation (min) (ACUTE ONLY) 31 min    Received pt in bed expressing general pain from fibromyalgia but agreeable for short bout in hallway. Able to get to EOB and stand w/ minA. Pt limited by fatigue and ambulating short distance in hallway before returning back to room. Placed back in bed w/ all needs met and bed alarm on.   Frederico Hamman Mobility Specialist Please contact via SecureChat or  Rehab office at [phone]

## 2022-08-28 DIAGNOSIS — I6329 Cerebral infarction due to unspecified occlusion or stenosis of other precerebral arteries: Secondary | ICD-10-CM

## 2022-08-28 LAB — COMPREHENSIVE METABOLIC PANEL
ALT: 33 U/L (ref 0–44)
AST: 34 U/L (ref 15–41)
Albumin: 3.4 g/dL — ABNORMAL LOW (ref 3.5–5.0)
Alkaline Phosphatase: 85 U/L (ref 38–126)
Anion gap: 10 (ref 5–15)
BUN: 12 mg/dL (ref 8–23)
CO2: 25 mmol/L (ref 22–32)
Calcium: 8.8 mg/dL — ABNORMAL LOW (ref 8.9–10.3)
Chloride: 103 mmol/L (ref 98–111)
Creatinine, Ser: 0.78 mg/dL (ref 0.44–1.00)
GFR, Estimated: >60 mL/min (ref >60)
Glucose, Bld: 115 mg/dL — ABNORMAL HIGH (ref 70–99)
Potassium: 4.1 mmol/L (ref 3.5–5.1)
Sodium: 138 mmol/L (ref 135–145)
Total Bilirubin: 0.5 mg/dL (ref 0.3–1.2)
Total Protein: 6.6 g/dL (ref 6.5–8.1)

## 2022-08-28 LAB — CBC WITH DIFFERENTIAL/PLATELET
Abs Immature Granulocytes: 0.03 10*3/uL (ref 0.00–0.07)
Basophils Absolute: 0 10*3/uL (ref 0.0–0.1)
Basophils Relative: 0 %
Eosinophils Absolute: 0.2 10*3/uL (ref 0.0–0.5)
Eosinophils Relative: 3 %
HCT: 43.1 % (ref 36.0–46.0)
Hemoglobin: 13.7 g/dL (ref 12.0–15.0)
Immature Granulocytes: 0 %
Lymphocytes Relative: 45 %
Lymphs Abs: 3.3 10*3/uL (ref 0.7–4.0)
MCH: 28.4 pg (ref 26.0–34.0)
MCHC: 31.8 g/dL (ref 30.0–36.0)
MCV: 89.4 fL (ref 80.0–100.0)
Monocytes Absolute: 0.7 10*3/uL (ref 0.1–1.0)
Monocytes Relative: 9 %
Neutro Abs: 3.2 10*3/uL (ref 1.7–7.7)
Neutrophils Relative %: 43 %
Platelets: 224 10*3/uL (ref 150–400)
RBC: 4.82 MIL/uL (ref 3.87–5.11)
RDW: 14.6 % (ref 11.5–15.5)
WBC: 7.5 10*3/uL (ref 4.0–10.5)
nRBC: 0 % (ref 0.0–0.2)

## 2022-08-28 MED ORDER — GLUCERNA SHAKE PO LIQD
237.0000 mL | Freq: Three times a day (TID) | ORAL | Status: DC
  Administered 2022-08-28 – 2022-09-02 (×15): 237 mL via ORAL

## 2022-08-28 NOTE — Progress Notes (Signed)
Inpatient Rehabilitation Center Individual Statement of Services  Patient Name:  Jenny Acosta  Date:  08/28/2022  Welcome to the Inpatient Rehabilitation Center.  Our goal is to provide you with an individualized program based on your diagnosis and situation, designed to meet your specific needs.  With this comprehensive rehabilitation program, you will be expected to participate in at least 3 hours of rehabilitation therapies Monday-Friday, with modified therapy programming on the weekends.  Your rehabilitation program will include the following services:  Physical Therapy (PT), Occupational Therapy (OT), Speech Therapy (ST), 24 hour per day rehabilitation nursing, Therapeutic Recreaction (TR), Neuropsychology, Care Coordinator, Rehabilitation Medicine, Nutrition Services, Pharmacy Services, and Other  Weekly team conferences will be held on Wednesdays to discuss your progress.  Your Inpatient Rehabilitation Care Coordinator will talk with you frequently to get your input and to update you on team discussions.  Team conferences with you and your family in attendance may also be held.  Expected length of stay: 5-7 Days  Overall anticipated outcome: Supevision-Mod I   Depending on your progress and recovery, your program may change. Your Inpatient Rehabilitation Care Coordinator will coordinate services and will keep you informed of any changes. Your Inpatient Rehabilitation Care Coordinator's name and contact numbers are listed  below.  The following services may also be recommended but are not provided by the Inpatient Rehabilitation Center:   Home Health Rehabiltiation Services Outpatient Rehabilitation Services    Arrangements will be made to provide these services after discharge if needed.  Arrangements include referral to agencies that provide these services.  Your insurance has been verified to be:  [facility] Your primary doctor is:  Oliver Barre MD  Pertinent information will be shared  with your doctor and your insurance company.  Inpatient Rehabilitation Care Coordinator:  Lavera Guise, [place] [phone] or [phone]  Information discussed with and copy given to patient by: Andria Rhein, 08/28/2022, 9:23 AM

## 2022-08-28 NOTE — Progress Notes (Signed)
PROGRESS NOTE   Subjective/Complaints:  Discussed labwork, no c/os today , ambulated with PT using rolling walker  Pt with hx of fibromyalgia  ROS- neg CP, mild DOE, no SOB at rest , neg N/V/D Objective:   No results found. Recent Labs    08/27/22 0343 08/28/22 0642  WBC 10.3 7.5  HGB 14.0 13.7  HCT 43.0 43.1  PLT 233 224   Recent Labs    08/27/22 0343 08/28/22 0642  NA 138 138  K 3.7 4.1  CL 103 103  CO2 24 25  GLUCOSE 118* 115*  BUN 11 12  CREATININE 0.68 0.78  CALCIUM 9.1 8.8*    Intake/Output Summary (Last 24 hours) at 08/28/2022 1610 Last data filed at 08/27/2022 1818 Gross per 24 hour  Intake 720 ml  Output --  Net 720 ml        Physical Exam: Vital Signs Blood pressure 132/70, pulse 80, temperature 98.2 F (36.8 C), temperature source Oral, resp. rate 18, height 5\' 5"  (1.651 m), weight 108.9 kg, SpO2 98 %.   General: No acute distress Mood and affect are appropriate Heart: Regular rate and rhythm no rubs murmurs or extra sounds Lungs: Clear to auscultation, breathing unlabored, no rales or wheezes Abdomen: Positive bowel sounds, soft nontender to palpation, nondistended Extremities: No clubbing, cyanosis, or edema Skin: No evidence of breakdown, no evidence of rash Neurologic: Cranial nerves II through XII intact, motor strength is 5/5 in bilateral deltoid, bicep, tricep, grip, hip flexor, knee extensors, ankle dorsiflexor and plantar flexor Sensory exam normal sensation to light touch  in bilateral upper and lower extremities Cerebellar exam normal finger to nose to finger as well as heel to shin in bilateral upper and lower extremities Musculoskeletal: Full range of motion in all 4 extremities. No joint swelling   Assessment/Plan: 1. Functional deficits which require 3+ hours per day of interdisciplinary therapy in a comprehensive inpatient rehab setting. Physiatrist is providing close team  supervision and 24 hour management of active medical problems listed below. Physiatrist and rehab team continue to assess barriers to discharge/monitor patient progress toward functional and medical goals  Care Tool:  Bathing              Bathing assist       Upper Body Dressing/Undressing Upper body dressing        Upper body assist      Lower Body Dressing/Undressing Lower body dressing            Lower body assist       Toileting Toileting    Toileting assist Assist for toileting: Minimal Assistance - Patient > 75%     Transfers Chair/bed transfer  Transfers assist           Locomotion Ambulation   Ambulation assist              Walk 10 feet activity   Assist           Walk 50 feet activity   Assist           Walk 150 feet activity   Assist           Walk 10  feet on uneven surface  activity   Assist           Wheelchair     Assist               Wheelchair 50 feet with 2 turns activity    Assist            Wheelchair 150 feet activity     Assist          Blood pressure 132/70, pulse 80, temperature 98.2 F (36.8 C), temperature source Oral, resp. rate 18, height 5\' 5"  (1.651 m), weight 108.9 kg, SpO2 98 %.  Medical Problem List and Plan: 1. Functional deficits secondary to right occipital and cerebellum infarctions/severe basilar artery stenosis likely from atherosclerosis              -patient may shower             -ELOS/Goals: 5-7 days                      Admit to CIR 2.  Antithrombotics: -DVT/anticoagulation:  Pharmaceutical: Heparin             -antiplatelet therapy: Aspirin 81 mg daily and Plavix 75 mg daily.  Considering Brilinta if patient can afford. 3. Pain Management: Tramadol as needed Hx fibromyalgia, on nortriptyline consider adjusting med management if this is interfering with therapy 4. Mood/Behavior/Sleep: Pamelor 10 mg twice daily, Paxil 30 mg daily.   Provide emotional support             -antipsychotic agents: N/A 5. Neuropsych/cognition: This patient is capable of making decisions on her own behalf. 6. Skin/Wound Care: Routine skin checks 7. Fluids/Electrolytes/Nutrition: Routine in and outs with follow-up chemistries Mildly low protein, pt is vegetarian will supplement with Glucerna 8.  Hyperlipidemia.  Continue Crestor 9.  Hypothyroidism.  Continue Synthroid 10.  Liver cirrhosis.  Seen incidentally on CT abdomen. LFTs normal on CMET today  Follow-up outpatient. 11.  Ascending aortic aneurysm measuring up to 5.3 cm.  Follow-up outpatient vascular will need re imaging in 56mo  12.  3 mm focal subpleural nodular consolidation right middle lobe.  Repeat imaging 4 to 6 weeks- PCP can follow this after D/C 13.  Obesity.  BMI 39.44.  Dietary follow-up 14.  GERD.  Continue Protonix    LOS: 1 days A FACE TO FACE EVALUATION WAS PERFORMED  Erick Colace 08/28/2022, 8:33 AM

## 2022-08-28 NOTE — Evaluation (Signed)
Physical Therapy Assessment and Plan  Patient Details  Name: Jenny Acosta MRN: [id number] Date of Birth: 1945/09/11  PT Diagnosis: Abnormality of gait, Difficulty walking, Impaired sensation, and Muscle weakness Rehab Potential: Good ELOS: 7-10 days.   Today's Date: 08/28/2022 PT Individual Time: 0800-0900 and 1347-1500 PT Individual Time Calculation (min): 60 min and 73 min.   Hospital Problem: Principal Problem:   Cerebrovascular accident (CVA) due to occlusion of right posterior communicating artery [facility])   Past Medical History:  Past Medical History:  Diagnosis Date   Allergic rhinitis, cause unspecified    Arthritis    Carotid stenosis    Family history of colon cancer 07/21/2013   mother   Gallstone    GERD (gastroesophageal reflux disease)    Hyperlipidemia 08/30/2010   Hypertension    Hypothyroidism    Impaired glucose tolerance    Pain, coccyx    Right lumbar radiculopathy 08/30/2010   Past Surgical History:  Past Surgical History:  Procedure Laterality Date   APPENDECTOMY  1953   CHOLECYSTECTOMY  2002   CHOLECYSTECTOMY OPEN  10 years ago   scarlet fever     age 3   TONSILLECTOMY  37    Assessment & Plan Clinical Impression: Jenny Acosta is a 77 year old right-handed female with history significant for carotid stenosis, ascending thoracic aortic aneurysm, hyperlipidemia, hypothyroidism, hypertension. Per chart review patient lives alone independent living facility. Ambulates with a rollator for community distances. Reports 1 fall in the past 6 months. She does not drive and has groceries delivered. She does have a daughter in the area to provide assistance. Presented 08/21/2022 with weakness, dizziness sustaining a fall. Patient had developed some lightheadedness and her legs gave out. Denied loss of consciousness. She was able to get up on her own. CT/MRI positive for acute infarction in the right cerebellum (right SCA territory and vermis), and mildly scattered  in the right PCA territory. Chronic posterior right MCA infarct new since the 2015 brain MRI. CTA of head and neck severe nearly occlusive basilar artery stenosis with an appearance that could reflect possible dissection particularly given progression since 2015. Moderate right M1 MCA stenosis. Approximately 50% stenosis of the proximal right ICA in the neck. Partially imaged ascending aortic aneurysm. Scans were reviewed by neurology dissection was doubtful. CT thoracic spine no fracture seen. Admission chemistries unremarkable except glucose 125, troponin negative. Echocardiogram with ejection fraction of 60 to 65% no wall motion abnormalities grade 1 diastolic dysfunction. Currently maintained on aspirin 81 mg daily and Plavix 75 mg daily. Considering Brilinta if she was able to afford. Subcutaneous heparin added for DVT prophylaxis. Patient did complete a course of Augmentin for question aspiration pneumonia in the setting of vomiting episode and currently remains afebrile. Tolerating a regular consistency diet. Therapy evaluations completed due to patient decreased functional mobility was admitted for a comprehensive rehab program.    Patient currently requires min with mobility secondary to muscle weakness and decreased coordination.  Prior to hospitalization, patient was modified independent  with mobility and lived with Alone in a Independent living facility home.  Home access is  Level entry.  Patient will benefit from skilled PT intervention to maximize safe functional mobility, minimize fall risk, and decrease caregiver burden for planned discharge home with 24 hour supervision.  Anticipate patient will benefit from follow up HH at discharge.  PT - End of Session Activity Tolerance: Tolerates 30+ min activity with multiple rests Endurance Deficit: Yes PT Assessment Rehab Potential (ACUTE/IP ONLY): Good PT Barriers  to Discharge: Other (comments) PT Barriers to Discharge Comments: 3 STE to dtr's  home, where will stay post[place]DC until return to [place]. PT Patient demonstrates impairments in the following area(s): Balance;Sensory;Endurance;Motor;Pain PT Transfers Functional Problem(s): Bed Mobility;Bed to Chair;Car;Furniture PT Locomotion Functional Problem(s): Ambulation;Wheelchair Mobility;Stairs PT Plan PT Intensity: Minimum of 1-2 x/day ,45 to 90 minutes PT Frequency: 5 out of 7 days PT Duration Estimated Length of Stay: 7-10 days. PT Treatment/Interventions: Ambulation/gait training;Community reintegration;Neuromuscular re-education;Stair training;UE/LE Strength taining/ROM;Wheelchair propulsion/positioning;Balance/vestibular training;Discharge planning;Therapeutic Activities;UE/LE Coordination activities;Therapeutic Exercise;Patient/family education;Functional mobility training PT Transfers Anticipated Outcome(s): Mod I PT Locomotion Anticipated Outcome(s): Mod I PT Recommendation Follow Up Recommendations: Home health PT Patient destination: Home Equipment Recommended: To be determined   PT Evaluation Precautions/Restrictions Precautions Precautions: Fall Restrictions Weight Bearing Restrictions: No General Chart Reviewed: Yes Family/Caregiver Present: No Vital Signs  Pain pain initially 5/10 in PM but decreased to 2/10 w/ activity. Pain Assessment Pain Scale: 0-10 Pain Score: 5  Faces Pain Scale: Hurts a little bit Pain Type: Chronic pain Pain Location: Back Pain Descriptors / Indicators: Constant Pain Onset: On-going Pain Interference Pain Interference Pain Effect on Sleep: 3. Frequently Pain Interference with Therapy Activities: 1. Rarely or not at all Pain Interference with Day-to-Day Activities: 1. Rarely or not at all Home Living/Prior Functioning Home Living Available Help at Discharge: Family;Available 24 hours/day Type of Home: Independent living facility Home Access: Level entry Home Layout: One level Bathroom Shower/Tub: Health visitor:  Handicapped height Bathroom Accessibility: Yes Additional Comments: pt states 3 STE w/ B rails  Lives With: Alone Prior Function Level of Independence: Independent with basic ADLs;Needs assistance with homemaking;Requires assistive device for independence  Able to Take Stairs?: Yes Vocation: Retired Vision/Perception  Vision - History Ability to See in Adequate Light: 1 Impaired Vision - Assessment Additional Comments: Patient only with diplopia when looking at objects greater than 4 feet away or out 45 degrees to her peripheral on both sides. Blurred vision at all distances, but able to read and see things on her phone when held close(within 8 inches) Perception Perception: Not tested Praxis Praxis: Not tested  Cognition Overall Cognitive Status: Within Functional Limits for tasks assessed Arousal/Alertness: Awake/alert Memory: Appears intact Awareness: Appears intact Problem Solving: Appears intact Safety/Judgment: Appears intact Comments: Patient states feeling like her short term memory is not as good as prior to CVA, but did well on recall tasks. Sensation Sensation Light Touch: Appears Intact Hot/Cold: Appears Intact Proprioception: Appears Intact Stereognosis: Appears Intact Coordination Gross Motor Movements are Fluid and Coordinated: Yes Fine Motor Movements are Fluid and Coordinated: Yes Finger Nose Finger Test: [facility] Heel Shin Test: [facility] Motor  Motor Motor: Within Functional Limits   Trunk/Postural Assessment  Thoracic Assessment Thoracic Assessment: Exceptions to [facility] (rounded shoulders.) Postural Control Postural Control: Deficits on evaluation Protective Responses: delayed  Balance Balance Balance Assessed: Yes Static Sitting Balance Static Sitting - Balance Support: Feet supported Static Sitting - Level of Assistance: 5: Stand by assistance Dynamic Sitting Balance Sitting balance - Comments: sitting EOB Extremity Assessment  RUE Assessment RUE  Assessment: Within Functional Limits LUE Assessment LUE Assessment: Within Functional Limits RLE Assessment RLE Assessment: Within Functional Limits General Strength Comments: grossly 4/5 but pain from fibromyalgia inhibits. LLE Assessment LLE Assessment: Within Functional Limits General Strength Comments: grossly 4/5 but pain from fibromyalgia inhibits  Care Tool Care Tool Bed Mobility Roll left and right activity   Roll left and right assist level: Supervision/Verbal cueing    Sit to lying activity  Sit to lying assist level: Supervision/Verbal cueing    Lying to sitting on side of bed activity   Lying to sitting on side of bed assist level: the ability to move from lying on the back to sitting on the side of the bed with no back support.: Contact Guard/Touching assist     Care Tool Transfers Sit to stand transfer   Sit to stand assist level: Minimal Assistance - Patient > 75%    Chair/bed transfer   Chair/bed transfer assist level: Minimal Assistance - Patient > 75%     Toilet transfer   Assist Level: Minimal Assistance - Patient > 75%    Car transfer   Car transfer assist level: Minimal Assistance - Patient > 75%      Care Tool Locomotion Ambulation   Assist level: Contact Guard/Touching assist Assistive device: Rollator Max distance: 90  Walk 10 feet activity   Assist level: Contact Guard/Touching assist Assistive device: Rollator   Walk 50 feet with 2 turns activity   Assist level: Contact Guard/Touching assist Assistive device: Rollator  Walk 150 feet activity        Walk 10 feet on uneven surfaces activity        Stairs   Assist level: Minimal Assistance - Patient > 75% Stairs assistive device: 2 hand rails Max number of stairs: 4  Walk up/down 1 step activity   Walk up/down 1 step (curb) assist level: Minimal Assistance - Patient > 75% Walk up/down 1 step or curb assistive device: 2 hand rails  Walk up/down 4 steps activity   Walk up/down 4  steps assist level: Minimal Assistance - Patient > 75% Walk up/down 4 steps assistive device: 2 hand rails  Walk up/down 12 steps activity Walk up/down 12 steps activity did not occur: Safety/medical concerns      Pick up small objects from floor        Wheelchair Is the patient using a wheelchair?: Yes Type of Wheelchair: Manual   Wheelchair assist level: Supervision/Verbal cueing Max wheelchair distance: 50  Wheel 50 feet with 2 turns activity   Assist Level: Maximal Assistance - Patient 25 - 49%  Wheel 150 feet activity   Assist Level: Maximal Assistance - Patient 25 - 49%    Refer to Care Plan for Long Term Goals  SHORT TERM GOAL WEEK 1 PT Short Term Goal 1 (Week 1): STG = LTG 2/2 ELOS  Recommendations for other services: None   Skilled Therapeutic Intervention Evaluation completed (see details above and below) with education on PT POC and goals and individual treatment initiated with focus on  balance, transfers, endurance, strengthening.   First session: Pt presents supine in bed and agreeable to therapy.  Pt transfers supine to sit w/ supervision, from flat bed and siderail.   Pt requires min A to thread pants and underwear over feet.  Pt doffs gown and dons pull-over shirt w/ supervision.  Pt dons shoes in Tripp position.  Pt transfers sit to stand w/ min A and verbal cues for hand placement.  Pt then able to pull underwear/pants up over hips w/ CGA steadying.  Pt amb w/ rollator x 25' into hallway before requiring seated rest break.  Pt able to manage w/c w/ B UES x 50' before fatiguing.  Pt wheeled remainder of distance to main gym.  Pt negotiated 4 steps w/ B rails and min A w/ reciprocal gait pattern (self-selected) although educated on step-to for energy conservation.  Pt transferred into simulated SUV  height car on mat table w/ min A.  Pt amb multiple trials w/ rollator and CGA up to 90'.  Pt returned to room and performed squat/step-pivot w/c > bed w/ min A.  Pt  remained sitting EOB w/ bed alarm on and all needs in reach, MD for assessment.  Second session:  Pt presents supine in bed and reluctantly agreeable to therapy, c/o LBP.  Pt states pain at 5/10.  Pt transfers sup to sit w/ supervision.  PT donned shoes 2/2 c/o LBP.  Pt performed sit to stand w/ min A and transfers to w/c.  Pt wheeled to outside via [facility].  Pt performed LE there ex for increased strengthening, calf raises, LAQ, hip flexion and abd/add.  Pt amb on outdoor surfaces of 90' w/ rollator and CGA, including turns to return to w/c.  Pt amb x 110' form outdoor surfaces through doors onto tiled floors.  Pt states slight dizziness and turned to sit on rollator.  Dizziness resolved and pt transfers rollator to w/c.  Pt returned to room and amb from hallway to bed.  Supervision for transfer sit to supine.  Bed alarm on and all needs in reach.     Mobility Bed Mobility Bed Mobility: Rolling Right;Rolling Left;Left Sidelying to Sit;Supine to Sit Rolling Right: Supervision/verbal cueing Rolling Left: Supervision/Verbal cueing Left Sidelying to Sit: Contact Guard/Touching assist Supine to Sit: Minimal Assistance - Patient > 75% Transfers Transfers: Sit to Stand;Stand Pivot Transfers Sit to Stand: Minimal Assistance - Patient > 75% Stand Pivot Transfers: Minimal Assistance - Patient > 75% Stand Pivot Transfer Details: Verbal cues for sequencing;Visual cues for safe use of DME/AE Transfer (Assistive device): Rollator Locomotion  Gait Ambulation: Yes Gait Assistance: Contact Guard/Touching assist Gait Distance (Feet): 90 Feet Assistive device: Rollator Stairs / Additional Locomotion Stairs: Yes Stairs Assistance: Minimal Assistance - Patient > 75% Stair Management Technique: Two rails Number of Stairs: 4 Height of Stairs: 6 Curb: Minimal Assistance - Patient >75% Naval architect Mobility: Yes Wheelchair Assistance: Doctor, general practice: Both  upper extremities Wheelchair Parts Management: Needs assistance Distance: 50   Discharge Criteria: Patient will be discharged from PT if patient refuses treatment 3 consecutive times without medical reason, if treatment goals not met, if there is a change in medical status, if patient makes no progress towards goals or if patient is discharged from hospital.  The above assessment, treatment plan, treatment alternatives and goals were discussed and mutually agreed upon: by patient  Lucio Edward 08/28/2022, 12:56 PM

## 2022-08-28 NOTE — Progress Notes (Signed)
Inpatient Rehabilitation Admission Medication Review by a Pharmacist  A complete drug regimen review was completed for this patient to identify any potential clinically significant medication issues.  High Risk Drug Classes Is patient taking? Indication by Medication  Antipsychotic No   Anticoagulant Yes Heparin SQ - VTE ppx  Antibiotic No   Opioid Yes Tramadol - pain   Antiplatelet Yes bASA, clopidogrel - stroke prophylaxis  Hypoglycemics/insulin No   Vasoactive Medication No   Chemotherapy No   Other Yes Zetia - HLD Levothyroxine - hypothyroid Nortriptyline - sleep Pantoprazole - GERD Paroxetine - depression Meclizine - prn dizziness     Type of Medication Issue Identified Description of Issue Recommendation(s)  Drug Interaction(s) (clinically significant)     Duplicate Therapy     Allergy  Refuses statin d/t reported allergy   No Medication Administration End Date  Planning DAPT x 3 months then bASA alone, however this recommendation will change if patient is enrolled in clinical trial F/u plans to begin Captiva trial  Incorrect Dose     Additional Drug Therapy Needed     Significant med changes from prior encounter (inform family/care partners about these prior to discharge).    Other       Clinically significant medication issues were identified that warrant physician communication and completion of prescribed/recommended actions by midnight of the next day:  No  Name of provider notified for urgent issues identified:   Provider Method of Notification:     Pharmacist comments:   Time spent performing this drug regimen review (minutes):  20  Rexford Maus, PharmD, BCPS 08/28/2022 7:23 AM

## 2022-08-28 NOTE — Plan of Care (Signed)
  Problem: RH Balance Goal: LTG Patient will maintain dynamic standing with ADLs (OT) Description: LTG:  Patient will maintain dynamic standing balance with assist during activities of daily living (OT)  Flowsheets (Taken 08/28/2022 1034) LTG: Pt will maintain dynamic standing balance during ADLs with: Independent with assistive device   Problem: RH Grooming Goal: LTG Patient will perform grooming w/assist,cues/equip (OT) Description: LTG: Patient will perform grooming with assist, with/without cues using equipment (OT) Flowsheets (Taken 08/28/2022 1034) LTG: Pt will perform grooming with assistance level of: Independent   Problem: RH Bathing Goal: LTG Patient will bathe all body parts with assist levels (OT) Description: LTG: Patient will bathe all body parts with assist levels (OT) Flowsheets (Taken 08/28/2022 1034) LTG: Pt will perform bathing with assistance level/cueing: Independent with assistive device    Problem: RH Dressing Goal: LTG Patient will perform upper body dressing (OT) Description: LTG Patient will perform upper body dressing with assist, with/without cues (OT). Flowsheets (Taken 08/28/2022 1034) LTG: Pt will perform upper body dressing with assistance level of: Independent Goal: LTG Patient will perform lower body dressing w/assist (OT) Description: LTG: Patient will perform lower body dressing with assist, with/without cues in positioning using equipment (OT) Flowsheets (Taken 08/28/2022 1034) LTG: Pt will perform lower body dressing with assistance level of: Independent with assistive device   Problem: RH Toileting Goal: LTG Patient will perform toileting task (3/3 steps) with assistance level (OT) Description: LTG: Patient will perform toileting task (3/3 steps) with assistance level (OT)  Flowsheets (Taken 08/28/2022 1034) LTG: Pt will perform toileting task (3/3 steps) with assistance level: Independent with assistive device   Problem: RH Vision Goal: RH LTG Vision  Consulting civil engineer) Flowsheets (Taken 08/28/2022 1034) LTG: Vision Goals: Patient will be Mod Independent with visual exercise following hand out.   Problem: RH Simple Meal Prep Goal: LTG Patient will perform simple meal prep w/assist (OT) Description: LTG: Patient will perform simple meal prep with assistance, with/without cues (OT). Flowsheets (Taken 08/28/2022 1034) LTG: Pt will perform simple meal prep with assistance level of: Supervision/Verbal cueing LTG: Pt will perform simple meal prep w/level of: Ambulate with device   Problem: RH Toilet Transfers Goal: LTG Patient will perform toilet transfers w/assist (OT) Description: LTG: Patient will perform toilet transfers with assist, with/without cues using equipment (OT) Flowsheets (Taken 08/28/2022 1034) LTG: Pt will perform toilet transfers with assistance level of: Independent with assistive device   Problem: RH Tub/Shower Transfers Goal: LTG Patient will perform tub/shower transfers w/assist (OT) Description: LTG: Patient will perform tub/shower transfers with assist, with/without cues using equipment (OT) Flowsheets (Taken 08/28/2022 1034) LTG: Pt will perform tub/shower stall transfers with assistance level of: Supervision/Verbal cueing

## 2022-08-28 NOTE — Evaluation (Signed)
Occupational Therapy Assessment and Plan  Patient Details  Name: Jenny Acosta MRN: [id number] Date of Birth: [birth year]  OT Diagnosis: disturbance of vision and muscle weakness (generalized) Rehab Potential: Rehab Potential (ACUTE ONLY): Good ELOS: 7-10 days   Today's Date: 08/28/2022 OT Individual Time: 1002-1059 OT Individual Time Calculation (min): 57 min     Hospital Problem: Principal Problem:   Cerebrovascular accident (CVA) due to occlusion of right posterior communicating artery (HCC)   Past Medical History:  Past Medical History:  Diagnosis Date   Allergic rhinitis, cause unspecified    Arthritis    Carotid stenosis    Family history of colon cancer 07/21/2013   mother   Gallstone    GERD (gastroesophageal reflux disease)    Hyperlipidemia 08/30/2010   Hypertension    Hypothyroidism    Impaired glucose tolerance    Pain, coccyx    Right lumbar radiculopathy 08/30/2010   Past Surgical History:  Past Surgical History:  Procedure Laterality Date   APPENDECTOMY  1953   CHOLECYSTECTOMY  2002   CHOLECYSTECTOMY OPEN  10 years ago   scarlet fever     age 25   TONSILLECTOMY  27    Assessment & Plan Clinical Impression: Patient is a 77 y.o. year old female with recent admission to the hospital on 08/20/2022  with history of carotid stenosis, HTN, HLD, GERD, hypothyroidism, who presented to [facility] ED from her facility for weakness, dizziness and sustaining a fall. She reports double vision, dizziness. found to have acute cerebellum stroke/R PCA distribution. 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   Patient transferred to CIR on 08/27/2022 .    Patient currently requires min with basic self-care skills secondary to muscle weakness and decreased visual acuity and decreased visual motor skills.  Prior to hospitalization, patient could complete IADL's with modified independent .  Patient will benefit from skilled intervention to increase independence with basic  self-care skills and increase level of independence with iADL prior to discharge home independently.  Anticipate patient will require intermittent supervision and follow up home health.  OT - End of Session Activity Tolerance: Tolerates 10 - 20 min activity with multiple rests Endurance Deficit: Yes Endurance Deficit Description: Patient reports a consistent decline in endurance over the last several months OT Assessment Rehab Potential (ACUTE ONLY): Good OT Patient demonstrates impairments in the following area(s): Balance;Vision;Cognition;Endurance;Safety OT Basic ADL's Functional Problem(s): Grooming;Bathing;Dressing;Toileting OT Advanced ADL's Functional Problem(s): Simple Meal Preparation OT Transfers Functional Problem(s): Toilet;Tub/Shower OT Additional Impairment(s): Fuctional Use of Upper Extremity OT Plan OT Intensity: Minimum of 1-2 x/day, 45 to 90 minutes OT Frequency: 5 out of 7 days OT Duration/Estimated Length of Stay: 7-10 days OT Treatment/Interventions: Balance/vestibular training;Disease mangement/prevention;Self Care/advanced ADL retraining;Neuromuscular re-education;Therapeutic Exercise;Cognitive remediation/compensation;DME/adaptive equipment instruction;Pain management;UE/LE Strength taining/ROM;UE/LE Coordination activities;Patient/family education;Community reintegration;Discharge planning;Functional mobility training;Therapeutic Activities;Visual/perceptual remediation/compensation OT Basic Self-Care Anticipated Outcome(s): Modified Independent OT Toileting Anticipated Outcome(s): Modified Independent OT Bathroom Transfers Anticipated Outcome(s): Modified Independent OT Recommendation Recommendations for Other Services: Therapeutic Recreation consult Therapeutic Recreation Interventions: Kitchen group;Pet therapy;Stress management;Outing/community reintergration Patient destination: Assisted Living Follow Up Recommendations: Home health OT Equipment Details:  Patient reports having all needed DME at her senior apartment   OT Evaluation Precautions/Restrictions  Precautions Precautions: Fall Restrictions Weight Bearing Restrictions: No General Chart Reviewed: Yes Response to Previous Treatment: Patient with no complaints from previous session Family/Caregiver Present: No Vital Signs   Pain Pain Assessment Pain Scale: 0-10 Pain Score: 5  Faces Pain Scale: Hurts a little bit Pain Type: Chronic  pain Pain Location: Back Pain Descriptors / Indicators: Constant Pain Onset: On-going Home Living/Prior Functioning Home Living Living Arrangements: Alone Available Help at Discharge: Family, Available 24 hours/day Type of Home: Independent living facility Home Access: Level entry Home Layout: One level Bathroom Shower/Tub: Health visitor: Handicapped height Bathroom Accessibility: Yes Additional Comments: pt states 3 STE w/ B rails  Lives With: Alone IADL History Homemaking Responsibilities: Yes Meal Prep Responsibility: Primary Laundry Responsibility: No Cleaning Responsibility: Secondary Bill Paying/Finance Responsibility: Primary Shopping Responsibility: No Child Care Responsibility: No Homemaking Comments: Reports having assist with cleaning and laundry because they are too fatiguing. 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 calls herself a home body Prior Function Level of Independence: Independent with basic ADLs, Needs assistance with homemaking, Requires assistive device for independence  Able to Take Stairs?: Yes Vocation: Retired Vision Baseline Vision/History: 6 Macular Degeneration Ability to See in Adequate Light: 1 Impaired Patient Visual Report:  Diplopia;Blurring of vision;Peripheral vision impairment;Eye fatigue/eye pain/headache Vision Assessment?: Vision impaired- to be further tested in functional context Additional Comments: Patient only with diplopia when looking at objects greater than 4 feet away or out 45 degrees to her peripheral on both sides. Blurred vision at all distances, but able to read and see things on her phone when held close(within 8 inches) Perception  Perception: Not tested Praxis Praxis: Not tested Cognition Cognition Overall Cognitive Status: Within Functional Limits for tasks assessed Arousal/Alertness: Awake/alert Orientation Level: Person;Place;Situation Person: Oriented Place: Oriented Situation: Oriented Memory: Appears intact Awareness: Appears intact Problem Solving: Appears intact Safety/Judgment: Appears intact Comments: Patient states feeling like her short term memory is not as good as prior to CVA, but did well on recall tasks. Brief Interview for Mental Status (BIMS) Repetition of Three Words (First Attempt): 3 Temporal Orientation: Year: Correct Temporal Orientation: Month: Accurate within 5 days Temporal Orientation: Day: Correct Recall: "Sock": Yes, no cue required Recall: "Blue": Yes, no cue required Recall: "Bed": Yes, no cue required BIMS Summary Score: 15 Sensation Sensation Light Touch: Appears Intact Hot/Cold: Appears Intact Proprioception: Appears Intact Stereognosis: Appears Intact Coordination Gross Motor Movements are Fluid and Coordinated: Yes Fine Motor Movements are Fluid and Coordinated: Yes Finger Nose Finger Test: [facility] Heel Shin Test: [facility] Motor  Motor Motor: Within Functional Limits  Trunk/Postural Assessment  Thoracic Assessment Thoracic Assessment: Exceptions to [facility] (rounded shoulders.) Postural Control Postural Control: Deficits on evaluation Protective Responses: delayed  Balance Balance Balance Assessed: Yes Static Sitting Balance Static  Sitting - Balance Support: Feet supported Static Sitting - Level of Assistance: 5: Stand by assistance Dynamic Sitting Balance Sitting balance - Comments: sitting EOB Extremity/Trunk Assessment RUE Assessment RUE Assessment: Within Functional Limits LUE Assessment LUE Assessment: Within Functional Limits  Care Tool Care Tool Self Care Eating   Eating Assist Level: Independent    Oral Care    Oral Care Assist Level: Set up assist    Bathing   Body parts bathed by patient: Right arm;Face;Left arm;Chest;Abdomen;Front perineal area;Buttocks;Right upper leg;Right lower leg;Left upper leg;Left lower leg     Assist Level: Contact Guard/Touching assist    Upper Body Dressing(including orthotics)   What is the patient wearing?: Pull over shirt   Assist Level: Set up assist    Lower Body Dressing (excluding footwear)   What  is the patient wearing?: Underwear/pull up;Pants Assist for lower body dressing: Minimal Assistance - Patient > 75%    Putting on/Taking off footwear   What is the patient wearing?: Non-skid slipper socks Assist for footwear: Total Assistance - Patient < 25%       Care Tool Toileting Toileting activity   Assist for toileting: Minimal Assistance - Patient > 75%     Care Tool Bed Mobility Roll left and right activity   Roll left and right assist level: Supervision/Verbal cueing    Sit to lying activity   Sit to lying assist level: Supervision/Verbal cueing    Lying to sitting on side of bed activity   Lying to sitting on side of bed assist level: the ability to move from lying on the back to sitting on the side of the bed with no back support.: Contact Guard/Touching assist     Care Tool Transfers Sit to stand transfer   Sit to stand assist level: Minimal Assistance - Patient > 75%    Chair/bed transfer   Chair/bed transfer assist level: Minimal Assistance - Patient > 75%     Toilet transfer   Assist Level: Minimal Assistance - Patient > 75%      Care Tool Cognition  Expression of Ideas and Wants Expression of Ideas and Wants: 3. Some difficulty - exhibits some difficulty with expressing needs and ideas (e.g, some words or finishing thoughts) or speech is not clear  Understanding Verbal and Non-Verbal Content Understanding Verbal and Non-Verbal Content: 4. Understands (complex and basic) - clear comprehension without cues or repetitions   Memory/Recall Ability Memory/Recall Ability : Current season;That he or she is in a hospital/hospital unit   Refer to Care Plan for Long Term Goals  SHORT TERM GOAL WEEK 1 OT Short Term Goal 1 (Week 1): Patient to perform toilet transfer with SBA using rollator OT Short Term Goal 2 (Week 1): Patient to perform LE dressing with AE PRN, SBA with min vc's OT Short Term Goal 3 (Week 1): Patient to perform shower transfer with CGA using rollator OT Short Term Goal 4 (Week 1): Patient to perform grooming standing at the sink with supervision  Recommendations for other services: Therapeutic Recreation  Pet therapy, Kitchen group, Stress management, and Outing/community reintegration   Skilled Therapeutic Intervention ADL ADL Eating: Modified independent Where Assessed-Eating: Edge of bed Grooming: Supervision/safety Where Assessed-Grooming: Standing at sink Upper Body Bathing: Setup Where Assessed-Upper Body Bathing: Sitting at sink Lower Body Bathing: Contact guard Where Assessed-Lower Body Bathing: Sitting at sink;Standing at sink Upper Body Dressing: Setup Where Assessed-Upper Body Dressing: Edge of bed Lower Body Dressing: Minimal assistance Where Assessed-Lower Body Dressing: Edge of bed Toileting: Minimal assistance Where Assessed-Toileting: Teacher, adult education: Furniture conservator/restorer Method: Proofreader: Engineer, technical sales: Not assessed Film/video editor: Not assessed Mobility  Bed Mobility Bed Mobility: Rolling Right;Rolling  Left;Left Sidelying to Sit;Supine to Sit Rolling Right: Supervision/verbal cueing Rolling Left: Supervision/Verbal cueing Left Sidelying to Sit: Contact Guard/Touching assist Supine to Sit: Minimal Assistance - Patient > 75% Transfers Sit to Stand: Minimal Assistance - Patient > 75%   Discharge Criteria: Patient will be discharged from OT if patient refuses treatment 3 consecutive times without medical reason, if treatment goals not met, if there is a change in medical status, if patient makes no progress towards goals or if patient is discharged from hospital.  The above assessment, treatment plan, treatment alternatives and goals were discussed and mutually agreed upon:  by patient  Warnell Forester 08/28/2022, 3:47 PM

## 2022-08-29 MED ORDER — LIDOCAINE 5 % EX PTCH
2.0000 | MEDICATED_PATCH | CUTANEOUS | Status: DC
  Administered 2022-08-29 – 2022-09-03 (×6): 2 via TRANSDERMAL
  Filled 2022-08-29 (×7): qty 2

## 2022-08-29 MED ORDER — TRAMADOL HCL 50 MG PO TABS
50.0000 mg | ORAL_TABLET | Freq: Four times a day (QID) | ORAL | Status: DC
  Administered 2022-08-29 – 2022-09-03 (×19): 50 mg via ORAL
  Filled 2022-08-29 (×19): qty 1

## 2022-08-29 MED ORDER — TIZANIDINE HCL 4 MG PO TABS
2.0000 mg | ORAL_TABLET | Freq: Three times a day (TID) | ORAL | Status: DC | PRN
  Administered 2022-08-30: 2 mg via ORAL
  Filled 2022-08-29: qty 1

## 2022-08-29 NOTE — Progress Notes (Signed)
PROGRESS NOTE   Subjective/Complaints:   Discussed labwork, no c/os today , ambulated with PT using rolling walker  Pt with hx of fibromyalgia with widespread body pain lower back is most sore followed by shoulders  Pt reports occ word finding delay , but not witnessed by author  ROS- neg CP, mild DOE, no SOB at rest , neg N/V/D Objective:   No results found. Recent Labs    08/27/22 0343 08/28/22 0642  WBC 10.3 7.5  HGB 14.0 13.7  HCT 43.0 43.1  PLT 233 224    Recent Labs    08/27/22 0343 08/28/22 0642  NA 138 138  K 3.7 4.1  CL 103 103  CO2 24 25  GLUCOSE 118* 115*  BUN 11 12  CREATININE 0.68 0.78  CALCIUM 9.1 8.8*     Intake/Output Summary (Last 24 hours) at 08/29/2022 0801 Last data filed at 08/29/2022 1610 Gross per 24 hour  Intake 1661 ml  Output --  Net 1661 ml         Physical Exam: Vital Signs Blood pressure 135/75, pulse 95, temperature 98.2 F (36.8 C), resp. rate 16, height 5\' 5"  (1.651 m), weight 108.9 kg, SpO2 95 %.   General: No acute distress Mood and affect are appropriate Heart: Regular rate and rhythm no rubs murmurs or extra sounds Lungs: Clear to auscultation, breathing unlabored, no rales or wheezes Abdomen: Positive bowel sounds, soft nontender to palpation, nondistended Extremities: No clubbing, cyanosis, or edema Skin: No evidence of breakdown, no evidence of rash Neurologic: Cranial nerves II through XII intact, motor strength is 5/5 in bilateral deltoid, bicep, tricep, grip, hip flexor, knee extensors, ankle dorsiflexor and plantar flexor Sensory exam normal sensation to light touch  in bilateral upper and lower extremities Cerebellar exam normal finger to nose to finger as well as heel to shin in bilateral upper and lower extremities Speech without dysarthria or anomia , fluent at sentence level  Musculoskeletal: Full range of motion in all 4 extremities. No joint swelling    Assessment/Plan: 1. Functional deficits which require 3+ hours per day of interdisciplinary therapy in a comprehensive inpatient rehab setting. Physiatrist is providing close team supervision and 24 hour management of active medical problems listed below. Physiatrist and rehab team continue to assess barriers to discharge/monitor patient progress toward functional and medical goals  Care Tool:  Bathing    Body parts bathed by patient: Right arm, Face, Left arm, Chest, Abdomen, Front perineal area, Buttocks, Right upper leg, Right lower leg, Left upper leg, Left lower leg         Bathing assist Assist Level: Contact Guard/Touching assist     Upper Body Dressing/Undressing Upper body dressing   What is the patient wearing?: Pull over shirt    Upper body assist Assist Level: Set up assist    Lower Body Dressing/Undressing Lower body dressing      What is the patient wearing?: Underwear/pull up, Pants     Lower body assist Assist for lower body dressing: Minimal Assistance - Patient > 75%     Toileting Toileting    Toileting assist Assist for toileting: Minimal Assistance - Patient > 75%  Transfers Chair/bed transfer  Transfers assist     Chair/bed transfer assist level: Minimal Assistance - Patient > 75%     Locomotion Ambulation   Ambulation assist      Assist level: Contact Guard/Touching assist Assistive device: Rollator Max distance: 110   Walk 10 feet activity   Assist     Assist level: Contact Guard/Touching assist Assistive device: Rollator   Walk 50 feet activity   Assist    Assist level: Contact Guard/Touching assist Assistive device: Rollator    Walk 150 feet activity   Assist           Walk 10 feet on uneven surface  activity   Assist     Assist level: Contact Guard/Touching assist (outdoor surfaces.) Assistive device: Rollator   Wheelchair     Assist Is the patient using a wheelchair?: Yes Type of  Wheelchair: Manual    Wheelchair assist level: Supervision/Verbal cueing Max wheelchair distance: 50    Wheelchair 50 feet with 2 turns activity    Assist        Assist Level: Maximal Assistance - Patient 25 - 49%   Wheelchair 150 feet activity     Assist      Assist Level: Maximal Assistance - Patient 25 - 49%   Blood pressure 135/75, pulse 95, temperature 98.2 F (36.8 C), resp. rate 16, height 5\' 5"  (1.651 m), weight 108.9 kg, SpO2 95 %.  Medical Problem List and Plan: 1. Functional deficits secondary to right occipital and cerebellum infarctions/severe basilar artery stenosis likely from atherosclerosis              -patient may shower             -ELOS/Goals: 5-7 days                      Admit to CIR 2.  Antithrombotics: -DVT/anticoagulation:  Pharmaceutical: Heparin             -antiplatelet therapy: Aspirin 81 mg daily and Plavix 75 mg daily.  Considering Brilinta if patient can afford. 3. Pain Management: Tramadol as needed Hx fibromyalgia, on nortriptyline consider adjusting med management if this is interfering with therapy Also takes QID tramadol at home as well as prn tizanidine  May add lidocaine patch for back  4. Mood/Behavior/Sleep: Pamelor 10 mg twice daily, Paxil 30 mg daily.  Provide emotional support              -antipsychotic agents: N/A 5. Neuropsych/cognition: This patient is capable of making decisions on her own behalf. 6. Skin/Wound Care: Routine skin checks 7. Fluids/Electrolytes/Nutrition: Routine in and outs with follow-up chemistries Mildly low protein, pt is vegetarian will supplement with Glucerna 8.  Hyperlipidemia.  Continue Crestor 9.  Hypothyroidism.  Continue Synthroid 10.  Liver cirrhosis.  Seen incidentally on CT abdomen. LFTs normal on CMET today  Follow-up outpatient. 11.  Ascending aortic aneurysm measuring up to 5.3 cm.  Follow-up outpatient vascular will need re imaging in 37mo  12.  3 mm focal subpleural nodular  consolidation right middle lobe.  Repeat imaging 4 to 6 weeks- PCP can follow this after D/C given short LOS 13.  Obesity.  BMI 39.44.  Dietary follow-up 14.  GERD.  Continue Protonix    LOS: 2 days A FACE TO FACE EVALUATION WAS PERFORMED  Erick Colace 08/29/2022, 8:01 AM

## 2022-08-29 NOTE — Progress Notes (Signed)
Inpatient Rehabilitation Care Coordinator Assessment and Plan Patient Details  Name: Jenny Acosta MRN: [id number] Date of Birth: 24-Oct-1945  Today's Date: 08/29/2022  Hospital Problems: Principal Problem:   Cerebrovascular accident (CVA) due to occlusion of right posterior communicating artery [facility])  Past Medical History:  Past Medical History:  Diagnosis Date   Allergic rhinitis, cause unspecified    Arthritis    Carotid stenosis    Family history of colon cancer 07/21/2013   mother   Gallstone    GERD (gastroesophageal reflux disease)    Hyperlipidemia 08/30/2010   Hypertension    Hypothyroidism    Impaired glucose tolerance    Pain, coccyx    Right lumbar radiculopathy 08/30/2010   Past Surgical History:  Past Surgical History:  Procedure Laterality Date   APPENDECTOMY  1953   CHOLECYSTECTOMY  2002   CHOLECYSTECTOMY OPEN  10 years ago   scarlet fever     age 36   TONSILLECTOMY  15   Social History:  reports that she has never smoked. She has never used smokeless tobacco. She reports that she does not drink alcohol and does not use drugs.  Family / Support Systems Marital Status: Single Patient Roles: Parent Spouse/Significant Other: n/a Children: Verlon Au, Daughter and Bart, son Other Supports: n/a Anticipated Caregiver: Roma Kayser, daughter Ability/Limitations of Caregiver: none reported Caregiver Availability: 24/7 Family Dynamics: support from children  Social History Preferred language: English Religion: Protestant Cultural Background: Independent overall Education: HS Health Literacy - How often do you need to have someone help you when you read instructions, pamphlets, or other written material from your doctor or pharmacy?: Never Writes: Yes Legal History/Current Legal Issues: n/a Guardian/Conservator: n/a   Abuse/Neglect Abuse/Neglect Assessment Can Be Completed: Yes Physical Abuse: Denies Verbal Abuse: Denies Sexual Abuse:  Denies Exploitation of patient/patient's resources: Denies Self-Neglect: Denies  Patient response to: Social Isolation - How often do you feel lonely or isolated from those around you?: Never  Emotional Status Pt's affect, behavior and adjustment status: Pleasant having lunch at bedside Recent Psychosocial Issues: coping Psychiatric History: n/a Substance Abuse History: n/a  Patient / Family Perceptions, Expectations & Goals Pt/Family understanding of illness & functional limitations: yes Premorbid pt/family roles/activities: Independent overall Anticipated changes in roles/activities/participation: Discharging home with daughter to assist/supervision 24/7 Pt/family expectations/goals: [employer]: None Premorbid Home Care/DME Agencies: Other (Comment) (Rollator, SPC and Shower seat) Transportation available at discharge: daughter Is the patient able to respond to transportation needs?: Yes In the past 12 months, has lack of transportation kept you from medical appointments or from getting medications?: No In the past 12 months, has lack of transportation kept you from meetings, work, or from getting things needed for daily living?: No  Discharge Planning Living Arrangements: Alone Support Systems: Children Type of Residence: Private residence (Patient 1 level, level entry. Daughter: 1 level, 4 steps) Insurance Resources: Media planner (specify) Education officer, museum) Financial Resources: Restaurant manager, fast food Screen Referred: No Living Expenses: Psychologist, sport and exercise Management: Patient Does the patient have any problems obtaining your medications?: No Home Management: Independent Patient/Family Preliminary Plans: Daughter able to assist if needed Care Coordinator Barriers to Discharge: Insurance for SNF coverage Care Coordinator Anticipated Follow Up Needs: HH/OP Expected length of stay: 5-7 Days  Clinical Impression Sw met with patient,  introduced self and explained role. Patient plans to discharge home with her daughter. Patient living alone prior. Daughter lives in 1 level, 4 steps to enter. Patient currently has a rollator, SPC and shower seat.  No additional questions or concerns.  Andria Rhein 08/29/2022, 1:09 PM

## 2022-08-29 NOTE — Progress Notes (Signed)
Occupational Therapy Session Note  Patient Details  Name: Jenny Acosta MRN: [id number] Date of Birth: 1945/07/03  Today's Date: 08/29/2022 OT Individual Time: 0865-7846 session 1 OT Individual Time Calculation (min): 46 min  Session 2: 1138-1205   Short Term Goals: Week 1:  OT Short Term Goal 1 (Week 1): Patient to perform toilet transfer with SBA using rollator OT Short Term Goal 2 (Week 1): Patient to perform LE dressing with AE PRN, SBA with min vc's OT Short Term Goal 3 (Week 1): Patient to perform shower transfer with CGA using rollator OT Short Term Goal 4 (Week 1): Patient to perform grooming standing at the sink with supervision  Skilled Therapeutic Interventions/Progress Updates:  Session 1: Pt greeted supine in bed, pt agreeable to OT intervention. Session focus on BADL reeducation, functional mobility, dynamic sitting balance and decreasing overall caregiver burden.     Pt completed supine>sit via log roll technique with CGA. Pt completed EOB bathing with overall set- up - supervision assist. Education provided throughout ADL on energy conservation strategies for ADLs.  Sit>stands from EOB with rollator with CGA. Pt able to doff underwear/pants in standing with CGA in order to wash periarea, pt did need assist with posterior periarea bathing.      Discussed hand held shower head for home and DME needs, pt able to stand at sink with CGA with w/c behind for safety to complete grooming tasks.      Ended session with pt seated EOB  with all needs within reach and bed alarm activated.                   Session 2: Pt greeted asleep in supine, pt easily able to arouse and pt agreeable to OT intervention. Session focus on functional mobility, dynamic standing balance, BLE strength/endurance and decreasing overall caregiver burden.           Pt completed below therapeutic exercises to facilitate improved overall strength and endurance for higher level functional mobility tasks:   X10  sit>stands with an emphasis on BLE strength with overall CGA X10 standing mini squats Pt completed Functional ambulation around room with emphasis on dynamic standing balance via gathering items in room to simulate IADLs, pt completed task with rollator and CGA. X10 reciprocal Stepping up/down on 1 inch step with BUE support from rollator with emphasis on BLE strength/endurance           Ended session with pt seated EOB with all needs within reach and bed alarm activated.                    Therapy Documentation Precautions:  Precautions Precautions: Fall Restrictions Weight Bearing Restrictions: No  Pain: Session 1: 10/10 pain in BLEs, rest breaks provided as needed with RN providing pain meds prior to session.  Session 2: unrated pain reported in BLEs, rest breaks provided as needed.     Therapy/Group: Individual Therapy  Pollyann Glen [facility] 08/29/2022, 12:05 PM

## 2022-08-29 NOTE — Progress Notes (Signed)
Physical Therapy Session Note  Patient Details  Name: Jenny Acosta MRN: [id number] Date of Birth: Feb 26, 1945  Today's Date: 08/29/2022 PT Individual Time: 4098-1191 1305 - 1402 PT Individual Time Calculation (min): 70 min; 57 min  Short Term Goals: Week 1:  PT Short Term Goal 1 (Week 1): STG = LTG 2/2 ELOS  SESSION 1 Skilled Therapeutic Interventions/Progress Updates: Patient L sidelying in bed on entrance to room. Patient alert and agreeable to PT session.   Patient reported 4/10 pain in low back/hip (reported from fibromyalgia history). Attending RN provided lidocaine patch at beginning of session.  Therapeutic Activity: Bed Mobility: Pt performed L siedlying<>sit on EOB with supervision for safety. VC required for L leaning when laying from sitting in order to avoid immediately lying flat on back to protect low back (CGA/light minA for safety to advance LE's).  Sharlene Motts Balance test performed today (34/56). Pt noted to have potential saccades in R eye after PTA noticed some nystagmus in R eye while pt was performing standing unsupported balance after cues to locate verticle line on wall ahead (door frame) to provide a static object to look at. PTA sat pt down and had pt track finger. When Pt R eye followed finger into adduction there was a slight slippage (overshoot) in R eye and notable nystagmus when R eye followed finger into abduction. Attending PT alerted for potential vestibular evaluation at a future session.   Transfers: Pt performed sit<>stand transfers throughout session with CGA at first for safety, and then progressed to to supervision. Pt recalled to push with one UE instead of B UE on rollator at beginning of session, and then required questioning cues towards end of session. Pt required to use bathroom at end of session and was transported to room dependently for time. PT ambulate a couple of steps to bathroom and was with CGA for safety. Pt with outside supervision for privacy  and was able to provide self personal care (urine void) and ambulated back to EOB.  Gait Training:  Pt ambulated about 15' from room almost to day room gym using rollator with CGA for safety and WC follow (PTA managing). Pt required rest break and was dependently transported to day room gym for energy conservation per pt report of feeling fatigued after first day of therapy the previous day.   Patient L sidelying in bed at end of session with brakes locked, bed alarm set, and all needs within reach.  SESSION 2 Skilled Therapeutic Interventions/Progress Updates: Patient L sidelying in bed on entrance to room. Patient alert and agreeable to PT session.   Patient reported 4/10 pain in low back (lidocaine patch wearing off form pt's report). Pt also required to use the restroom at beginning of session.  Therapeutic Activity: Bed Mobility: Pt performed supine to sit EOB with supervision, and minA from EOB to supine. VC required for L lateral lean onto elbow to assist in elevating B LE onto bed. Transfers: Pt performed sit<>stand transfers throughout session with rollator and supervision for safety. Provided VC for pt to ensure that B handbrakes are locked in (PTA tightened brakes due to them not functioning properly).  Neuromuscular Re-ed: NMR facilitated during session with focus on challenging pt's dynamic standing balance. - Toe taps on 4" step. Pt provided with CGA for safety with one moment of LOB that required patient to retro step back to hi/low mat due to getting distracted (performs better when focused on task).  - ambulating outside of main gym with PTA  providing cues for pt to look in various directions (pt with rollator and CGA for safety). Pt with decreased cadence and required a rest break after 20' on rollator with cues to breath in through the nose to increase O2 saturation. Pt ambulated back to main gym to hi/low mat with same cues. Pt did not report any dizziness.  NMR performed for  improvements in motor control and coordination, balance, sequencing, judgement, and self confidence/ efficacy in performing all aspects of mobility at highest level of independence.   Therapeutic Exercise: Pt performed the following exercises in order to increase pt's cardiovascular endurance. - Pt ambulated room to main gym (about 100') using rollator with supervision/CGA for safety. Pt did not require a rest break, but did report feeling fatigued after getting to hi/low mat. WC brought with for safety. Pt ambulated back to room after interventions in main gym with supervision and use of rollator (no rest break, but pt reported feeling fatigue after reaching room).  Patient L sidelying in bed at end of session with brakes locked, bed alarm set, and all needs within reach.      Therapy Documentation Precautions:  Precautions Precautions: Fall Restrictions Weight Bearing Restrictions: No  Therapy/Group: Individual Therapy  Norrin Shreffler PTA 08/29/2022, 12:55 PM

## 2022-08-29 NOTE — Progress Notes (Signed)
Inpatient Rehabilitation  Patient information reviewed and entered into eRehab system by Georgena Weisheit M. Briani Maul, M.A., CCC/SLP, PPS Coordinator.  Information including medical coding, functional ability and quality indicators will be reviewed and updated through discharge.    

## 2022-08-29 NOTE — IPOC Note (Signed)
Overall Plan of Care [facility]) Patient Details Name: Jenny Acosta MRN: [id number] DOB: 12/12/1945  Admitting Diagnosis: Cerebrovascular accident (CVA) due to occlusion of right posterior communicating artery [facility])  Hospital Problems: Principal Problem:   Cerebrovascular accident (CVA) due to occlusion of right posterior communicating artery (HCC)     Functional Problem List: Nursing Safety, Bowel, Endurance, Medication Management, Motor, Pain  PT Balance, Sensory, Endurance, Motor, Pain  OT Balance, Vision, Cognition, Endurance, Safety  SLP    TR         Basic ADL's: OT Grooming, Bathing, Dressing, Toileting     Advanced  ADL's: OT Simple Meal Preparation     Transfers: PT Bed Mobility, Bed to Chair, Car, Occupational psychologist, Research scientist (life sciences): PT Ambulation, Psychologist, prison and probation services, Stairs     Additional Impairments: OT Fuctional Use of Upper Extremity  SLP        TR      Anticipated Outcomes Item Anticipated Outcome  Self Feeding    Swallowing      Basic self-care  Modified Independent  Toileting  Modified Independent   Bathroom Transfers Modified Independent  Bowel/Bladder  continent B/B with constipation  Transfers  Mod I  Locomotion  Mod I  Communication     Cognition     Pain  less than 3  Safety/Judgment  no falls   Therapy Plan: PT Intensity: Minimum of 1-2 x/day ,45 to 90 minutes PT Frequency: 5 out of 7 days PT Duration Estimated Length of Stay: 7-10 days. OT Intensity: Minimum of 1-2 x/day, 45 to 90 minutes OT Frequency: 5 out of 7 days OT Duration/Estimated Length of Stay: 7-10 days     Team Interventions: Nursing Interventions Patient/Family Education, Medication Management, Psychosocial Support, Bowel Management, Disease Management/Prevention, Pain Management, Discharge Planning  PT interventions Ambulation/gait training, Community reintegration, Neuromuscular re-education, Stair training, UE/LE Strength taining/ROM,  Wheelchair propulsion/positioning, Warden/ranger, Discharge planning, Therapeutic Activities, UE/LE Coordination activities, Therapeutic Exercise, Patient/family education, Functional mobility training  OT Interventions Balance/vestibular training, Disease mangement/prevention, Self Care/advanced ADL retraining, Neuromuscular re-education, Therapeutic Exercise, Cognitive remediation/compensation, DME/adaptive equipment instruction, Pain management, UE/LE Strength taining/ROM, UE/LE Coordination activities, Patient/family education, Community reintegration, Discharge planning, Functional mobility training, Therapeutic Activities, Visual/perceptual remediation/compensation  SLP Interventions    TR Interventions    SW/CM Interventions Discharge Planning, Psychosocial Support, Patient/Family Education, Disease Management/Prevention   Barriers to Discharge MD   Pain management  Nursing Decreased caregiver support, Home environment access/layout, Weight 1 level with 3 steps; rail on right  PT Other (comments) 3 STE to dtr's home, where will stay [place]DC until return to [place].  OT      SLP      SW Insurance for SNF coverage     Team Discharge Planning: Destination: PT-Home ,OT- Assisted Living , SLP-  Projected Follow-up: PT-Home health PT, OT-  Home health OT, SLP-  Projected Equipment Needs: PT-To be determined, OT-  , SLP-  Equipment Details: PT- , OT-Patient reports having all needed DME at her senior apartment Patient/family involved in discharge planning: PT- Patient,  OT-Patient, SLP-   MD ELOS: 7-10d Medical Rehab Prognosis:  Good Assessment: The patient has been admitted for CIR therapies with the diagnosis of Rigth PCA CVA. The team will be addressing functional mobility, strength, stamina, balance, safety, adaptive techniques and equipment, self-care, bowel and bladder mgt, patient and caregiver education, pain management. Goals have been set at Mod I. Anticipated discharge  destination is Home .  See Team Conference Notes for weekly updates to the plan of care

## 2022-08-30 MED ORDER — ONDANSETRON HCL 4 MG PO TABS
4.0000 mg | ORAL_TABLET | Freq: Three times a day (TID) | ORAL | Status: DC | PRN
  Administered 2022-08-30: 4 mg via ORAL
  Filled 2022-08-30: qty 1

## 2022-08-30 MED ORDER — CALCIUM CARBONATE ANTACID 500 MG PO CHEW
1.0000 | CHEWABLE_TABLET | Freq: Four times a day (QID) | ORAL | Status: DC | PRN
  Filled 2022-08-30: qty 1

## 2022-08-30 NOTE — Progress Notes (Signed)
Physical Therapy Session Note  Patient Details  Name: Jenny Acosta MRN: [id number] Date of Birth: August 05, 1945  Today's Date: 08/30/2022 PT Individual Time: 1420-1502 PT Individual Time Calculation (min): 42 min   Short Term Goals: Week 1:  PT Short Term Goal 1 (Week 1): STG = LTG 2/2 ELOS  Skilled Therapeutic Interventions/Progress Updates: Patient R sidelying in bed on entrance to room. Patient alert and agreeable to PT session.   Patient reported pain this morning (unrated) and endorses receiving lidocaine patch on low back.  Therapeutic Activity: Bed Mobility: Pt performed supine<>sit x 3 with HOB elevated (supervision provided for safety). VC for patient to avoid using HOB railing as pt will not have railing at home. Instructional cues provided for pt to use L elbow and R hand to control descent to L sidelying and then to elevate B LE's. Pt provided same cues to perform same in reverse (L elbow and R hand to push self up to sitting EOB. Last round was performed with bed flat and pt still with supervision. Transfers: Pt performed sit<>stand transfers throughout session with supervision for safety. Provided VC to ensure rollator brakes are locked.   Stair Navigation: - Pt ascended/descended 4 (6") steps x 2 without rest. Pt with CGA at first for safety, then supervision. Pt use of B HR and initially reciprocal step pattern ascending, and cued to step to while descending to conserve energy and for pt safety. Pt reported feeling okay with reciprocal step pattern when ascending, but will use step to when feeling more fatigued. Pt performed reciprocal pattern ascending with only VC required for pt to ensure entire foot is on the step by touching the next step base.   NMRE Re-Education: - Pt performed ambulating without AD in order to increase dynamic standing balance during ambulatory activities. Pt ambulated 16' x 3 at first for pt to increase self confidence without rollator support. PTA  providing CGA/supervision for safety. Pt had one moment of unsteadiness to the L but was able to maintain standing balance. Pt then ambulated 155' with no AD and WC follow for safety (CGA/supervision for safety) and then required a rest break. Pt dependently transported back to room for time management.   Therapeutic Exercise: Pt performed the following exercises in order to increase physical endurance for community ambulation - Pt ambulated 150'+ from room to main gym by the stairs using rollator with WC follow for safety and supervision throughout.  Patient sitting EOB in bed at end of session with brakes locked, bed alarm set, and all needs within reach.      Therapy Documentation Precautions:  Precautions Precautions: Fall Restrictions Weight Bearing Restrictions: No  Therapy/Group: Individual Therapy  Athanasia Stanwood PTA 08/30/2022, 3:35 PM

## 2022-08-30 NOTE — Progress Notes (Signed)
Physical Therapy Session Note  Patient Details  Name: Jenny Acosta MRN: [id number] Date of Birth: 03-Feb-1946  Today's Date: 08/30/2022 PT Individual Time: 0807-0907 PT Individual Time Calculation (min): 60 min   Short Term Goals: Week 1:  PT Short Term Goal 1 (Week 1): STG = LTG 2/2 ELOS  Skilled Therapeutic Interventions/Progress Updates:    Pt received supported upright in bed awake and agreeable to therapy session. Supine>sitting L EOB, HOB slightly elevated, mod-I.   Primary PT team consulted this therapist for a vestibular eval and pt in agreement.   Pt denies any dizziness at this time and states last time she had dizziness was yesterday when ambulating with head turns. Reports she also has dizziness with repeated sit<>stand transfers. Pt also wonders if medications are contributing to dizziness - recommended she discuss this further with MD or pharmacist.   Reports she had true vertigo and diplopia immediately following the CVA but that those symptoms have now dissipated. Pt reports in the past, she has had hx of vestibular involvement from an ear infection and states she did perform positioning maneuvers in the past and that the recent dizziness is not the same dizziness as what she experienced in the past. Reports increase in blurred vision following CVA, primarily for up close/reading tasks, that has not improved yet - educated her to discuss further with MD when to seek follow-up for this.  Educated pt on BE FAST sign/symptoms of CVA. Educated pt on recommendation to increase walking activity each day for overall health and wellness.   Orthostatic vitals: Sitting: BP 143/86 (MAP 102), HR 97bpm  Standing: BP 118/71 (MAP 83), HR 102bpm - no symptoms despite 25 drop in systolic BP Pt reports she usually only has dizziness with repeated sit<>stands so educated pt on avoiding that. Sit<>stand EOB<>rollator with CGA for safety - pt slow to rise, reporting some knee  discomfort.  Oculomotor Exam:  - smooth pursuit: WNL in all directions with no nystagmus noted  - saccades: horizontal: slight corrective saccades noted in L eye with pt reporting she feels like it is moving "slower" than R but improves on 2nd trial; vertical: WNL  - Initiated head impulse test with pt able to keep eye on target WNL; however, unable to perform an impulse due to pt inability to relax her neck and she reports having occasional muscle "spasms" when just performing R/L rotation - VOR cancellation test: WNL  Provided pt with the below saccade exercises to improve oculomotor coordination.  Access Code: [email or web address] URL: [email or web address] Date: 08/30/2022 Prepared by: Casimiro Needle  Exercises - Seated Back and [facility] Movements WITHOUT head rotation  - 1 x daily - 7 x weekly - 2 sets - 10 reps   Gait training ~55ft using rollator with CGA for safety. Pt demonstrating the following gait deviations with therapist providing the described cuing and facilitation for improvement:  - decreased gait speed - decreased step lengths - slight forward trunk flexion at hips - attempted some head rotations to see if this would elicit same dizziness symptoms as experienced yesterday; however, pt hyperverbal and becomes distracted not rotating head in continuous movement   At end of session, pt left seated on EOB with needs in reach and bed alarm on with nurse present.   Therapy Documentation Precautions:  Precautions Precautions: Fall Restrictions Weight Bearing Restrictions: No   Pain:  No reports of pain throughout session.    Therapy/Group: Individual Therapy  Ginny Forth , PT, DPT, NCS, CSRS 08/30/2022,  7:47 AM

## 2022-08-30 NOTE — Progress Notes (Signed)
Occupational Therapy Session Note  Patient Details  Name: Jenny Acosta MRN: [id number] Date of Birth: June 06, 1945  Today's Date: 08/30/2022 OT Individual Time: 1111-1200 OT Individual Time Calculation (min): 49 min    Short Term Goals: Week 1:  OT Short Term Goal 1 (Week 1): Patient to perform toilet transfer with SBA using rollator OT Short Term Goal 2 (Week 1): Patient to perform LE dressing with AE PRN, SBA with min vc's OT Short Term Goal 3 (Week 1): Patient to perform shower transfer with CGA using rollator OT Short Term Goal 4 (Week 1): Patient to perform grooming standing at the sink with supervision  Skilled Therapeutic Interventions/Progress Updates: patient stated she was too fatigued to get out of bed. Concurred to bedside endurance activities and problem solving for showering at her independent/assisted living apartment home.   She stated she was concerned about slipping in the shower.  She stated she willask maintenance to put down non slide strips in her large shower.    After OT education and patient problem solving, patient stated she will purchase a shower chair and long hand nosal for her bathroom at home.   Patient stated that since the stroke from time to time she sees check marks and small squres in her visual field.   She asked if there are hospital doctors to address that concern or if she will need a functional vision or a neuro vision specialist outside of this hospital settnig.  Patient was left in bed with HOB elevated and call bell and phone within reach.     Therapy Documentation Precautions:  Precautions Precautions: Fall Restrictions Weight Bearing Restrictions: No     Therapy/Group: Individual Therapy  Bud Face [facility] 08/30/2022, 3:14 PM

## 2022-08-30 NOTE — Progress Notes (Signed)
Physical Therapy Session Note  Patient Details  Name: Jenny Acosta MRN: [id number] Date of Birth: May 25, 1945  Today's Date: 08/30/2022 PT Individual Time: 0918-1009 PT Individual Time Calculation (min): 51 min   Short Term Goals: Week 1:  PT Short Term Goal 1 (Week 1): STG = LTG 2/2 ELOS  Skilled Therapeutic Interventions/Progress Updates:  Patient sidelying to L side  on entrance to room. Patient alert and agreeable to PT session.   Patient with no pain complaint at start of session. But does relate minimal fibromyalgia pain throughout.   Therapeutic Activity: Bed Mobility: Pt performed supine <> sit with supervision requiring extra time to complete. Is able to push up from sidelying with self-reported improvement from previous day. VC/ tc required for pushing up from elbow to hand to reach upright seated position. . Transfers: Pt performed sit<>stand and stand pivot transfers throughout session with supervision, inlcuding toilet transfer. Requires MinA for clothing mgmt and time to complete. Performs pericare with supervision. Provided verbal cues for mgmt of rollator including brake application.  Gait Training:  Pt ambulated ~150 ft  using bari rollator with one standin rest break and close supervision. Demonstrated decreased step length/ height throughout and manages to self correct positioning and proximity to rollator. Provided vc/ tc for safe brake application.   Patient supine in bed at end of session with brakes locked, bed alarm set, and all needs within reach.   Therapy Documentation Precautions:  Precautions Precautions: Fall Restrictions Weight Bearing Restrictions: No General:   Vital Signs:  Pain:  Minimal pain complaint during session that is relieved with repositiong to seated or supine positioning. Pt premedicated with meds and lidocaine patches at back and sides of trunk.  Therapy/Group: Individual Therapy  Loel Dubonnet PT, DPT, CSRS 08/30/2022, 1:29 PM

## 2022-08-30 NOTE — Progress Notes (Signed)
Slept good. Pain managed with scheduled meds. Abdominal bruising because of heparin SQ injections. Jenny Acosta A

## 2022-08-31 NOTE — Progress Notes (Signed)
PROGRESS NOTE   Subjective/Complaints:   No issues overnite  ROS- neg CP, mild DOE, no SOB at rest , neg N/V/D Objective:   No results found. No results for input(s): "WBC", "HGB", "HCT", "PLT" in the last 72 hours.  No results for input(s): "NA", "K", "CL", "CO2", "GLUCOSE", "BUN", "CREATININE", "CALCIUM" in the last 72 hours.   Intake/Output Summary (Last 24 hours) at 08/31/2022 0847 Last data filed at 08/31/2022 8657 Gross per 24 hour  Intake 1980 ml  Output --  Net 1980 ml         Physical Exam: Vital Signs Blood pressure 123/82, pulse 91, temperature 97.6 F (36.4 C), resp. rate 18, height 5\' 5"  (1.651 m), weight 108.9 kg, SpO2 95 %.   General: No acute distress Mood and affect are appropriate Heart: Regular rate and rhythm no rubs murmurs or extra sounds Lungs: Clear to auscultation, breathing unlabored, no rales or wheezes Abdomen: Positive bowel sounds, soft nontender to palpation, nondistended Extremities: No clubbing, cyanosis, or edema Skin: No evidence of breakdown, no evidence of rash Neurologic: Cranial nerves II through XII intact, motor strength is 5/5 in bilateral deltoid, bicep, tricep, grip, hip flexor, knee extensors, ankle dorsiflexor and plantar flexor Sensory exam normal sensation to light touch  in bilateral upper and lower extremities Cerebellar exam normal finger to nose to finger as well as heel to shin in bilateral upper and lower extremities Speech without dysarthria or anomia , fluent at sentence level  Musculoskeletal: Full range of motion in all 4 extremities. No joint swelling   Assessment/Plan: 1. Functional deficits which require 3+ hours per day of interdisciplinary therapy in a comprehensive inpatient rehab setting. Physiatrist is providing close team supervision and 24 hour management of active medical problems listed below. Physiatrist and rehab team continue to assess barriers  to discharge/monitor patient progress toward functional and medical goals  Care Tool:  Bathing    Body parts bathed by patient: Right arm, Face, Left arm, Chest, Abdomen, Front perineal area, Buttocks, Right upper leg, Right lower leg, Left upper leg, Left lower leg         Bathing assist Assist Level: Contact Guard/Touching assist     Upper Body Dressing/Undressing Upper body dressing   What is the patient wearing?: Pull over shirt    Upper body assist Assist Level: Set up assist    Lower Body Dressing/Undressing Lower body dressing      What is the patient wearing?: Underwear/pull up, Pants     Lower body assist Assist for lower body dressing: Contact Guard/Touching assist (standing balance)     Toileting Toileting    Toileting assist Assist for toileting: Minimal Assistance - Patient > 75%     Transfers Chair/bed transfer  Transfers assist     Chair/bed transfer assist level: Minimal Assistance - Patient > 75%     Locomotion Ambulation   Ambulation assist      Assist level: Contact Guard/Touching assist Assistive device: Rollator Max distance: 110   Walk 10 feet activity   Assist     Assist level: Contact Guard/Touching assist Assistive device: Rollator   Walk 50 feet activity   Assist  Assist level: Contact Guard/Touching assist Assistive device: Rollator    Walk 150 feet activity   Assist Walk 150 feet activity did not occur: Safety/medical concerns (Per PT Eval)         Walk 10 feet on uneven surface  activity   Assist     Assist level: Contact Guard/Touching assist (outdoor surfaces.) Assistive device: Rollator   Wheelchair     Assist Is the patient using a wheelchair?: Yes Type of Wheelchair: Manual    Wheelchair assist level: Supervision/Verbal cueing Max wheelchair distance: 50    Wheelchair 50 feet with 2 turns activity    Assist        Assist Level: Maximal Assistance - Patient 25 - 49%    Wheelchair 150 feet activity     Assist      Assist Level: Maximal Assistance - Patient 25 - 49%   Blood pressure 123/82, pulse 91, temperature 97.6 F (36.4 C), resp. rate 18, height 5\' 5"  (1.651 m), weight 108.9 kg, SpO2 95 %.  Medical Problem List and Plan: 1. Functional deficits secondary to right occipital and cerebellum infarctions/severe basilar artery stenosis likely from atherosclerosis              -patient may shower             -ELOS/Goals: 5-7 days                      Admit to CIR 2.  Antithrombotics: -DVT/anticoagulation:  Pharmaceutical: Heparin             -antiplatelet therapy: Aspirin 81 mg daily and Plavix 75 mg daily.  Considering Brilinta if patient can afford. 3. Pain Management: Tramadol as needed Hx fibromyalgia, on nortriptyline improved with med adjustments Also takes QID tramadol at home as well as prn tizanidine  May add lidocaine patch for back  4. Mood/Behavior/Sleep: Pamelor 10 mg twice daily, Paxil 30 mg daily.  Provide emotional support              -antipsychotic agents: N/A 5. Neuropsych/cognition: This patient is capable of making decisions on her own behalf. 6. Skin/Wound Care: Routine skin checks 7. Fluids/Electrolytes/Nutrition: Routine in and outs with follow-up chemistries Mildly low protein, pt is vegetarian will supplement with Glucerna 8.  Hyperlipidemia.  Continue Crestor 9.  Hypothyroidism.  Continue Synthroid 10.  Possible Liver cirrhosis.basically irregularity of lever border , no mention of fatty liver   Seen incidentally on CT abdomen. LFTs normal on CMET today , no hepatomegaly  Follow-up outpatient. 11.  Ascending aortic aneurysm measuring up to 5.3 cm.  Follow-up outpatient vascular will need re imaging in 6mo  12.  3 mm focal subpleural nodular consolidation right middle lobe.  Repeat imaging 4 to 6 weeks- PCP Dr Oliver Barre can follow this after D/C given short LOS 13.  Obesity.  BMI 39.44.  Dietary follow-up 14.  GERD.   Continue Protonix    LOS: 4 days A FACE TO FACE EVALUATION WAS PERFORMED  Erick Colace 08/31/2022, 8:47 AM

## 2022-08-31 NOTE — Progress Notes (Cosign Needed)
Alert and oriented x4.  Lungs sounds clear bilaterally.  Pulse - Radial, Carotid, Dorsal Pedal and Brachial +2 bilaterally.  Pupils are equal, round, and reactive to light.  Skin is dry and intact.  Heart rate and rhythm are normal.  Active bowel sounds in all 4 quadrants.  Patient reports pain at a 7 and was given tramadol as scheduled.

## 2022-08-31 NOTE — Progress Notes (Signed)
Slept good. C/O indigestion at HS. Requesting zofran for "indigestion".  Paged Mercedes, Tums and zofran ordered PRN. Zofran given at 2142. Large continent BM. Bruising to abdomen from heparin SQ injections. Alfredo Martinez A

## 2022-08-31 NOTE — Progress Notes (Signed)
Alert and oriented x4.  Lungs sounds clear bilaterally.  Pulse - Radial, Carotid, Dorsal Pedal and Brachial +2 bilaterally.  Pupils are equal, round, and reactive to light.  Skin is dry and intact.  Heart rate and rhythm are normal.  Active bowel sounds in all 4 quadrants.  Patient reports pain at a 7 and was given tramadol as scheduled.   I agree with this student's documentation.

## 2022-09-01 DIAGNOSIS — M797 Fibromyalgia: Secondary | ICD-10-CM

## 2022-09-01 DIAGNOSIS — K5901 Slow transit constipation: Secondary | ICD-10-CM

## 2022-09-01 NOTE — Progress Notes (Signed)
Occupational Therapy Session Note  Patient Details  Name: Jenny Acosta MRN: [id number] Date of Birth: 1945-11-20  Today's Date: 09/01/2022 OT Individual Time: 1030-1120 session 1 OT Individual Time Calculation (min): 50 min  Session 2: 2355-7322   Short Term Goals: Week 1:  OT Short Term Goal 1 (Week 1): Patient to perform toilet transfer with SBA using rollator OT Short Term Goal 2 (Week 1): Patient to perform LE dressing with AE PRN, SBA with min vc's OT Short Term Goal 3 (Week 1): Patient to perform shower transfer with CGA using rollator OT Short Term Goal 4 (Week 1): Patient to perform grooming standing at the sink with supervision  Skilled Therapeutic Interventions/Progress Updates:  Session 1: Pt greeted seated EOB, pt agreeable to OT intervention.   Education and demo provided on AE for LB dressing d/t back pain, pt mostly interested in reacher. Issued pt handout on AE that can be purchased from [employer].                  9 Hole Peg Test is used to measure finger dexterity in pts with various neurological diagnoses. - Instructions The pt was instructed to pick up the pegs one at a time, using their dominant hand first and put them into the holes in any order until the holes were all filled. The pt then removed the pegs one at a time and returned them to the container. Both hands were tested separately.  - Results The pt completed the test in 34 seconds ( LUE) and 27 secs ( RUE)  . Scores are based on the time taken to complete the activity. The timer started the moment the pt touched the first peg until the moment the last peg hit the container.  - Norms for healthy females ages 72-70+ 99-55 R 17.38 L 18.92 56-60 R 17.86 L 19.48 61-65 R 18.99 L 20.33 66-70 R 19.90 L 21.44 71+ R 22.49 L 24.11  The Dynamometer Grip Strength Test is a quantitative and objective measure of isometric muscular strength of the hand and forearm.  -Instructions The patient was asked to sit  with their back, pelvis, and knees at 90 degrees. The shoulder was adducted and neutrally rotated with The elbow flexed to 90 degrees and forearm in neutral. The arm was not supported.  -Results The score was determined by calculating the average of 3 trials R- 22 lbs,  L- 10 lbs.   Female Average in lbs 55-59 R 57.3 L 47.3 60-64 R 55.1 L 45.7 65-69 R 49.6 L 41.0 70-74 R 49.6 L 41.5 75+ R 42.6 L 37.6 Pt reports wanting to work on hand strength as pt reports having difficulty opening items. Pt completed functional ambulation therapeutic activity with reacher with pt instructed to ambulate around room with rollator to gather ADL items from floor level with reacher. Pt completed task with supervision, MIN verbal cues provided on safety awareness when reaching out of BOS.   Ended session with pt seated EOB  with all needs within reach.                Session 2: Pt greeted seated EOB, pt agreeable to OT intervention. Session focus on BADL reeducation, functional mobility, dynamic standing balance and decreasing overall caregiver burden.      Pt completed ambulatory toilet transfer with rollator with supervision. Pt completed 3/3 toileting tasks with supervision with continent BM. Pt ambulated to shower with rollator using grab bars to enter. Pt completed all bathing tasks  seated on TTB with supervision. Pt exited shower with rollator with supervision. Pt completed dressing from EOB with supervision.   Discussed showering at her daughters house as daughter has tub shower, pt able to complete ambulatory shower transfer to TTB with rollator with supervision. Will continue to assess best method for shower transfer at daughters house.                  Ended session with pt seated EOB with all needs within reach and bed alarm activated.                    Therapy Documentation Precautions:  Precautions Precautions: Fall Restrictions Weight Bearing Restrictions: No  Pain: Session 1: 4/10  pain all over, rest breaks provided as needed Session 2: unrated pain reported all over, rest breaks provided as needed.     Therapy/Group: Individual Therapy  Barron Schmid 09/01/2022, 12:13 PM

## 2022-09-01 NOTE — Progress Notes (Signed)
Occupational Therapy Session Note  Patient Details  Name: Jenny Acosta MRN: [id number] Date of Birth: Sep 13, 1945  Today's Date: 09/01/2022 OT Individual Time: 1304-1330 OT Individual Time Calculation (min): 26 min    Short Term Goals: Week 1:  OT Short Term Goal 1 (Week 1): Patient to perform toilet transfer with SBA using rollator OT Short Term Goal 2 (Week 1): Patient to perform LE dressing with AE PRN, SBA with min vc's OT Short Term Goal 3 (Week 1): Patient to perform shower transfer with CGA using rollator OT Short Term Goal 4 (Week 1): Patient to perform grooming standing at the sink with supervision  Skilled Therapeutic Interventions/Progress Updates:    Pt received in room and ready to toilet and prepare for a shower during her next OT session. Pt ambulated around room with RW with distant supervision, gathered her clothing and supplies.  Her shower in [facility] is walk in with a seat but she will be going to her daughters house with a tub.  Discussed layout of bathroom and problem solved how to step into tub using the walls for support and then turn around to face faucets. (The toilet is in the way of stepping in close to the faucets). Her daughter can then put a small shower chair into the tub.  She would dry off and dtr would remove the chair for her to step out.  Discussed a tub bench as a good option but pt does not think she will be at her daughter's house for long.  Simulated that set up using the walls in her room with a barrier to practice stepping over in which she was able to do so with close Supervision.    Pt resting EOB with nursing in the room.    Therapy Documentation Precautions:  Precautions Precautions: Fall Restrictions Weight Bearing Restrictions: No  Pain: no c/o pain       Therapy/Group: Individual Therapy  Graysen Depaula 09/01/2022, 3:41 PM

## 2022-09-01 NOTE — Progress Notes (Signed)
Uneventful night. Continent B&B. LBM-7/6. Pain managed with scheduled meds. C/O bilateral shoulder pain at HS. Heat Packs applied after lidoderm patches were removed. Requesting kpad. I.S. at bedside. Encouraged patient to use. Jenny Acosta A

## 2022-09-01 NOTE — Progress Notes (Signed)
PROGRESS NOTE   Subjective/Complaints:  Slept well. Deals with generalized chronic pain from her FMS. Says it's under reasonable control presently.   ROS: Patient denies fever, rash, sore throat, blurred vision, dizziness, nausea, vomiting, diarrhea, cough, shortness of breath or chest pain,   headache, or mood change.    Objective:   No results found. No results for input(s): "WBC", "HGB", "HCT", "PLT" in the last 72 hours.  No results for input(s): "NA", "K", "CL", "CO2", "GLUCOSE", "BUN", "CREATININE", "CALCIUM" in the last 72 hours.   Intake/Output Summary (Last 24 hours) at 09/01/2022 1126 Last data filed at 09/01/2022 0750 Gross per 24 hour  Intake 820 ml  Output --  Net 820 ml        Physical Exam: Vital Signs Blood pressure (!) 144/75, pulse 84, temperature 97.6 F (36.4 C), resp. rate 17, height 5\' 5"  (1.651 m), weight 108.9 kg, SpO2 97 %.   Constitutional: No distress . Vital signs reviewed. obese HEENT: NCAT, EOMI, oral membranes moist Neck: supple Cardiovascular: RRR without murmur. No JVD    Respiratory/Chest: CTA Bilaterally without wheezes or rales. Normal effort    GI/Abdomen: BS +, non-tender, non-distended Ext: no clubbing, cyanosis, or edema Psych: pleasant and cooperative  Skin: No evidence of breakdown, no evidence of rash Neurologic: Cranial nerves II through XII intact, motor strength is 5/5 in bilateral deltoid, bicep, tricep, grip, hip flexor, knee extensors, ankle dorsiflexor and plantar flexor Sensory exam normal sensation to light touch  in bilateral upper and lower extremities Cerebellar exam normal finger to nose to finger as well as heel to shin in bilateral upper and lower extremities Speech without dysarthria or anomia , fluent at sentence level  Musculoskeletal: Full range of motion in all 4 extremities. No joint swelling. Generalized limb and axial discomfort.    Assessment/Plan: 1. Functional deficits which require 3+ hours per day of interdisciplinary therapy in a comprehensive inpatient rehab setting. Physiatrist is providing close team supervision and 24 hour management of active medical problems listed below. Physiatrist and rehab team continue to assess barriers to discharge/monitor patient progress toward functional and medical goals  Care Tool:  Bathing    Body parts bathed by patient: Right arm, Face, Left arm, Chest, Abdomen, Front perineal area, Buttocks, Right upper leg, Right lower leg, Left upper leg, Left lower leg         Bathing assist Assist Level: Contact Guard/Touching assist     Upper Body Dressing/Undressing Upper body dressing   What is the patient wearing?: Pull over shirt    Upper body assist Assist Level: Set up assist    Lower Body Dressing/Undressing Lower body dressing      What is the patient wearing?: Underwear/pull up, Pants     Lower body assist Assist for lower body dressing: Contact Guard/Touching assist (standing balance)     Toileting Toileting    Toileting assist Assist for toileting: Minimal Assistance - Patient > 75%     Transfers Chair/bed transfer  Transfers assist     Chair/bed transfer assist level: Minimal Assistance - Patient > 75%     Locomotion Ambulation   Ambulation assist      Assist  level: Contact Guard/Touching assist Assistive device: Rollator Max distance: 110   Walk 10 feet activity   Assist     Assist level: Contact Guard/Touching assist Assistive device: Rollator   Walk 50 feet activity   Assist    Assist level: Contact Guard/Touching assist Assistive device: Rollator    Walk 150 feet activity   Assist Walk 150 feet activity did not occur: Safety/medical concerns (Per PT Eval)         Walk 10 feet on uneven surface  activity   Assist     Assist level: Contact Guard/Touching assist (outdoor surfaces.) Assistive device:  Rollator   Wheelchair     Assist Is the patient using a wheelchair?: Yes Type of Wheelchair: Manual    Wheelchair assist level: Supervision/Verbal cueing Max wheelchair distance: 50    Wheelchair 50 feet with 2 turns activity    Assist        Assist Level: Maximal Assistance - Patient 25 - 49%   Wheelchair 150 feet activity     Assist      Assist Level: Maximal Assistance - Patient 25 - 49%   Blood pressure (!) 144/75, pulse 84, temperature 97.6 F (36.4 C), resp. rate 17, height 5\' 5"  (1.651 m), weight 108.9 kg, SpO2 97 %.  Medical Problem List and Plan: 1. Functional deficits secondary to right occipital and cerebellum infarctions/severe basilar artery stenosis likely from atherosclerosis              -patient may shower             -ELOS/Goals: 5-7 days                      -Continue CIR therapies including PT, OT  2.  Antithrombotics: -DVT/anticoagulation:  Pharmaceutical: Heparin             -antiplatelet therapy: Aspirin 81 mg daily and Plavix 75 mg daily.  Considering Brilinta if patient can afford. 3. Pain Management: Tramadol as needed Hx fibromyalgia, on nortriptyline improved with med adjustments Also takes QID tramadol at home as well as prn tizanidine     lidocaine patch for back, also using kpad   7/8 -discussed and pt did not request any changes to current regimen 4. Mood/Behavior/Sleep: Pamelor 10 mg twice daily, Paxil 30 mg daily.  Provide emotional support              -antipsychotic agents: N/A 5. Neuropsych/cognition: This patient is capable of making decisions on her own behalf. 6. Skin/Wound Care: Routine skin checks 7. Fluids/Electrolytes/Nutrition: Routine in and outs with follow-up chemistries Mildly low protein, pt is vegetarian will supplement with Glucerna 8.  Hyperlipidemia.  Continue Crestor 9.  Hypothyroidism.  Continue Synthroid 10.  Possible Liver cirrhosis.basically irregularity of lever border , no mention of fatty liver    Seen incidentally on CT abdomen. LFTs normal on CMET today , no hepatomegaly  Follow-up outpatient. 11.  Ascending aortic aneurysm measuring up to 5.3 cm.  Follow-up outpatient vascular will need re imaging in 38mo  12.  3 mm focal subpleural nodular consolidation right middle lobe.  Repeat imaging 4 to 6 weeks- PCP Dr Oliver Barre can follow this after D/C given short LOS 13.  Obesity.  BMI 39.44.  Dietary follow-up 14.  GERD.  Continue Protonix 15. Bowels: large BM 7/6. On vegetarian diet    LOS: 5 days A FACE TO FACE EVALUATION WAS PERFORMED  Ranelle Oyster 09/01/2022, 11:26 AM

## 2022-09-01 NOTE — Progress Notes (Signed)
Patient ID: Jenny Acosta, female   DOB: 30-May-1945, 77 y.o.   MRN: [id number] Met with the patient to review current situation, rehab process, team conference and plan of care. Discussed new medication for secondary risk management including HLD on Zetia, prediabetes on cardiac diet and given information prediabetes dietary modifications, and DAPT on ASA and Plavix. Reported constipation; on miralax and given prune juice per request.  Reported back pain managed hx of fibromyalgia. Continue to follow along to address educatioanl needs to facilitate preparation for discharge. Plans to initially go to daughter's apt. Before returning to her apt. Chana Bode B\

## 2022-09-01 NOTE — Progress Notes (Signed)
Physical Therapy Session Note  Patient Details  Name: Jenny Acosta MRN: [id number] Date of Birth: Mar 08, 1945  Today's Date: 09/01/2022 PT Individual Time: 0803-0901 PT Individual Time Calculation (min): 58 min   Short Term Goals: Week 1:  PT Short Term Goal 1 (Week 1): STG = LTG 2/2 ELOS  Skilled Therapeutic Interventions/Progress Updates:  Patient seated upright on EOB on entrance to room. Patient alert and agreeable to PT session.   Patient with no pain complaint at start of session.  Therapeutic Activity: Transfers: Pt performed sit<>stand and stand pivot transfers throughout session with and without rollator at supervision level. Provided vc for at least one hand pushing from seated surface. Pt with less effort in BUE and less pressure into shoulders with push-to-stand. Blocked practice with no AD in order to promote improved hand use, however with rollator placed in front of pt, she self-chooses to revert back to hands on on rollator handles. Improvement this session with safe brake application.   Gait Training:  Pt ambulated 150' x2 ft using rollator with close supervision. Rollator switched to standard width as this is what pt has at home. Demonstrated slow but consistent pace with low but adequate step height/ length. Provided vc/ tc for ***.  Wheelchair Mobility:  Pt propelled wheelchair *** feet with ***. Provided vc/ tc for ***.  Neuromuscular Re-ed: NMR facilitated during session with focus on***. Pt guided in ***. NMR performed for improvements in motor control and coordination, balance, sequencing, judgement, and self confidence/ efficacy in performing all aspects of mobility at highest level of independence.   Therapeutic Exercise: Pt performed the following exercises with vc/ tc for proper technique. ***  Patient *** at end of session with brakes locked, *** alarm set, and all needs within reach.   Therapy Documentation Precautions:  Precautions Precautions:  Fall Restrictions Weight Bearing Restrictions: No General:   Vital Signs: Therapy Vitals Temp: 97.6 F (36.4 C) Pulse Rate: 84 Resp: 17 BP: (!) 144/75 Patient Position (if appropriate): Sitting Oxygen Therapy SpO2: 97 % O2 Device: Room Air Pain:  Minimal pain related this session that was alleviated with change in position from standing to sitting.   Therapy/Group: Individual Therapy  Loel Dubonnet PT, DPT, CSRS 09/01/2022, 7:57 AM

## 2022-09-02 ENCOUNTER — Encounter (HOSPITAL_COMMUNITY): Payer: Self-pay | Admitting: Physical Medicine & Rehabilitation

## 2022-09-02 LAB — CK: Total CK: 31 U/L — ABNORMAL LOW (ref 38–234)

## 2022-09-02 MED ORDER — PANTOPRAZOLE SODIUM 40 MG PO TBEC: 40.0000 mg | DELAYED_RELEASE_TABLET | Freq: Every day | ORAL | 2 refills | Status: DC

## 2022-09-02 MED ORDER — LORATADINE 10 MG PO TABS: 10.0000 mg | ORAL_TABLET | Freq: Every day | ORAL | 0 refills | Status: AC

## 2022-09-02 MED ORDER — EZETIMIBE 10 MG PO TABS: 10.0000 mg | ORAL_TABLET | Freq: Every day | ORAL | 0 refills | Status: DC

## 2022-09-02 MED ORDER — EZETIMIBE 10 MG PO TABS
10.0000 mg | ORAL_TABLET | Freq: Every day | ORAL | Status: DC
  Administered 2022-09-02 – 2022-09-03 (×2): 10 mg via ORAL
  Filled 2022-09-02 (×2): qty 1

## 2022-09-02 MED ORDER — CLOPIDOGREL BISULFATE 75 MG PO TABS: 75.0000 mg | ORAL_TABLET | Freq: Every day | ORAL | 1 refills | Status: DC

## 2022-09-02 MED ORDER — NORTRIPTYLINE HCL 10 MG PO CAPS: 20.0000 mg | ORAL_CAPSULE | Freq: Every day | ORAL | 0 refills | Status: DC

## 2022-09-02 MED ORDER — NORTRIPTYLINE HCL 10 MG PO CAPS: 10.0000 mg | ORAL_CAPSULE | Freq: Two times a day (BID) | ORAL | 0 refills | Status: DC

## 2022-09-02 MED ORDER — TIZANIDINE HCL 2 MG PO TABS: 2.0000 mg | ORAL_TABLET | Freq: Three times a day (TID) | ORAL | 0 refills | Status: DC | PRN

## 2022-09-02 MED ORDER — POLYETHYLENE GLYCOL 3350 17 G PO PACK: 17.0000 g | PACK | Freq: Every day | ORAL | 0 refills | Status: DC

## 2022-09-02 MED ORDER — LIDOCAINE 5 % EX PTCH: 2.0000 | MEDICATED_PATCH | CUTANEOUS | 0 refills | Status: DC

## 2022-09-02 MED ORDER — MECLIZINE HCL 25 MG PO TABS: 25.0000 mg | ORAL_TABLET | Freq: Three times a day (TID) | ORAL | 0 refills | Status: DC | PRN

## 2022-09-02 MED ORDER — NORTRIPTYLINE HCL 10 MG PO CAPS: 20.0000 mg | ORAL_CAPSULE | Freq: Every day | ORAL | Status: DC

## 2022-09-02 MED ORDER — PAROXETINE HCL 30 MG PO TABS: 30.0000 mg | ORAL_TABLET | Freq: Every day | ORAL | 2 refills | Status: DC

## 2022-09-02 NOTE — Progress Notes (Signed)
Physical Therapy Discharge Summary  Patient Details  Name: Jenny Acosta MRN: [id number] Date of Birth: 03/15/1945  Date of Discharge from PT service:September 02, 2022  Patient has met 12 of 12 long term goals due to improved activity tolerance, improved balance, improved postural control, and decreased pain.  Patient to discharge at an ambulatory level Modified Independent.  Patient's care partner is independent to provide the necessary physical assistance at discharge.  Reasons goals not met: N/A  Recommendation:  Patient will benefit from ongoing skilled PT services in home health setting to continue to advance safe functional mobility, address ongoing impairments in strength, balance, activity tolerance, safety awareness, and to minimize fall risk.  Equipment: {equipment:[id number]}  Reasons for discharge: treatment goals met  Patient/family agrees with progress made and goals achieved: Yes  PT Discharge Precautions/Restrictions Precautions Precautions: Fall Restrictions Weight Bearing Restrictions: No Vital Signs Therapy Vitals Temp: 98.2 F (36.8 C) Pulse Rate: 96 Resp: 18 BP: 138/63 Patient Position (if appropriate): Lying Oxygen Therapy SpO2: 97 % O2 Device: Room Air Pain   Pain Interference Pain Interference Pain Effect on Sleep: 2. Occasionally Pain Interference with Therapy Activities: 1. Rarely or not at all Pain Interference with Day-to-Day Activities: 3. Frequently;1. Rarely or not at all Vision/Perception     Cognition Overall Cognitive Status: Within Functional Limits for tasks assessed Arousal/Alertness: Awake/alert Orientation Level: Oriented X4 Memory: Appears intact Awareness: Appears intact Problem Solving: Appears intact Safety/Judgment: Appears intact Sensation Sensation Light Touch: Appears Intact Hot/Cold: Appears Intact Proprioception: Appears Intact Stereognosis: Not tested Motor  Motor Motor: Within Functional Limits  Mobility Bed  Mobility Bed Mobility: Rolling Left;Sit to Supine;Supine to Sit Rolling Left: Independent with assistive device Left Sidelying to Sit: Independent Supine to Sit: Independent Sit to Supine: Independent Transfers Transfers: Sit to [employer] Transfers Sit to Stand: Independent with assistive device Stand Pivot Transfers: Independent with assistive device Transfer (Assistive device): Rollator Locomotion  Gait Ambulation: Yes Gait Assistance: Independent with assistive device Gait Distance (Feet): 150 Feet Assistive device: Rollator Gait Gait: Yes Gait Pattern: Within Functional Limits Gait velocity: decreased Stairs / Additional Locomotion Stairs: Yes Stairs Assistance: Independent with assistive device Stair Management Technique: Two rails Number of Stairs: 4 Height of Stairs: 6 Wheelchair Mobility Wheelchair Mobility: No  Trunk/Postural Assessment  Cervical Assessment Cervical Assessment: Within Functional Limits Thoracic Assessment Thoracic Assessment: Exceptions to [facility] (rouded shoulders) Lumbar Assessment Lumbar Assessment: Within Functional Limits Postural Control Postural Control: Within Functional Limits Protective Responses: delayed  Balance Balance Balance Assessed: Yes Standardized Balance Assessment Standardized Balance Assessment: Berg Balance Test Berg Balance Test Sit to Stand: Able to stand without using hands and stabilize independently Standing Unsupported: Able to stand 2 minutes with supervision Sitting with Back Unsupported but Feet Supported on Floor or Stool: Able to sit safely and securely 2 minutes Stand to Sit: Controls descent by using hands Transfers: Able to transfer safely, definite need of hands Standing Unsupported with Eyes Closed: Able to stand 10 seconds with supervision Standing Ubsupported with Feet Together: Able to place feet together independently and stand for 1 minute with supervision From Standing, Reach Forward with  Outstretched Arm: Can reach forward >12 cm safely (5") From Standing Position, Pick up Object from Floor: Able to pick up shoe safely and easily From Standing Position, Turn to Look Behind Over each Shoulder: Turn sideways only but maintains balance Turn 360 Degrees: Able to turn 360 degrees safely but slowly Standing Unsupported, Alternately Place Feet on Step/Stool: Able to complete 4 steps without  aid or supervision Standing Unsupported, One Foot in Front: Loses balance while stepping or standing Standing on One Leg: Tries to lift leg/unable to hold 3 seconds but remains standing independently Total Score: 37 Static Sitting Balance Static Sitting - Balance Support: Feet supported Static Sitting - Level of Assistance: 7: Independent Dynamic Sitting Balance Sitting balance - Comments: Sitting edge of mat Extremity Assessment      RLE Assessment RLE Assessment: Within Functional Limits LLE Assessment LLE Assessment: Within Functional Limits   Kieli Golladay PTA  09/02/2022, 4:48 PM

## 2022-09-02 NOTE — Progress Notes (Incomplete)
Inpatient Rehabilitation Discharge Medication Review by a Pharmacist  A complete drug regimen review was completed for this patient to identify any potential clinically significant medication issues.  High Risk Drug Classes Is patient taking? Indication by Medication  Antipsychotic No   Anticoagulant No   Antibiotic No   Opioid Yes PRN Tramadol - pain  Antiplatelet Yes Aspirin 81 mg and clopidogrel - CVA prophylaxis  Hypoglycemics/insulin Yes   Vasoactive Medication Yes   Chemotherapy No   Other Yes Ezetemibe, omega-3 - hyperlipidemia Levothyroxine - hypothyroidism Lidocaine patch - topical pain relief (back) Miralax - laxative Pantoprazole - GERD Paroxetine - mood Nortriptyline - mood, pain and sleep Vitamin D - supplement  PRNs: Acetaminophen - mild pain Albuterol inhaler - wheezing or shortness of breath Meclizine - dizziness Tizanidine - muscle spasms     Type of Medication Issue Identified Description of Issue Recommendation(s)  Drug Interaction(s) (clinically significant)     Duplicate Therapy     Allergy  Refuses statin d/t reported allergy. May hold off on Zetia as patient concerned that it may be causing soreness in joints.   No Medication Administration End Date  Planning DAPT x 3 months then bASA alone, however this recommendation will change if patient is enrolled in clinical trial  For outpatient follow up regarding Captiva trial.  Incorrect Dose     Additional Drug Therapy Needed     Significant med changes from prior encounter (inform family/care partners about these prior to discharge).    Other       Clinically significant medication issues were identified that warrant physician communication and completion of prescribed/recommended actions by midnight of the next day:  No  Pharmacist comments:  - may hold off on Ezetimibe, continue vegetarian diet.  Would recheck lipid panel at some point as outpatient - May continue Guaifenesin and Loratadine,  but not yet on discharge med list.  Time spent performing this drug regimen review (minutes):  20  Dennie Fetters, [place] 09/02/2022 4:36 PM

## 2022-09-02 NOTE — Patient Care Conference (Signed)
Inpatient RehabilitationTeam Conference and Plan of Care Update Date: 09/01/2022   Time: 1630 PM    Patient Name: Jenny Acosta      Medical Record Number: [id number]  Date of Birth: 1945-06-18 Sex: Female         Room/Bed: 4W15C/4W15C-01 Payor Info: Payor: Advertising copywriter MEDICARE / Plan: Suan Halter DUAL COMPLETE / Product Type: *No Product type* /    Admit Date/Time:  08/27/2022  3:20 PM  Primary Diagnosis:  Cerebrovascular accident (CVA) due to occlusion of right posterior communicating artery [facility])  Hospital Problems: Principal Problem:   Cerebrovascular accident (CVA) due to occlusion of right posterior communicating artery [facility])    Expected Discharge Date: Expected Discharge Date: 09/03/22  Team Members Present: Physician leading conference: Dr. Claudette Laws Social Worker Present: Lavera Guise, BSW Nurse Present: Chana Bode, RN PT Present: Ralph Leyden, PT OT Present: Barron Schmid, [place]     Current Status/Progress Goal Weekly Team Focus  Bowel/Bladder      Continent of bowel and bladder; constipation addressed    Continent of bowel and limited use of laxatives    Monitor need for and effectiveness of natural laxatives and dietary modification vs medications for constipation  Swallow/Nutrition/ Hydration               ADL's   supervision overall for ADLs, nearing MODI, MODI for functional mobility with rollator   MODI- I   activity tolerance, general conditioning and AE training    Mobility   bed mobility and sitting balance = IND; transfers and standing balance = Mod I with rollator; ambulation using rollator for up to 200 ft with Mod I   IND/ Mod I - met  Barriers to Discharge: none, made Mod I in room on 7/9 /// Weekly Focus: continued safety awareness, encouragement to continue to work on activity tolerance and strengthening in order to assist with fibromyalgia, encouragement to continue with OP therapies after HH to continue to work on  any balance issues and UE mobility with Neuro OP at [employer] [facility]               Pain      New shoulder pain; hx of fibromyalgia    Pain managed < 4 with prns    CK lab check with myalgia hx and statin intolerance history  Skin      N/a           Discharge Planning:  Discharging home tomorrow. HH established with [facility]. DME ordered through [employer].   Team Discussion: Patient doing well overall  post right (PCA) occipital/cerebellum basilar CVA.   Patient on target to meet rehab goals: yes  *See Care Plan and progress notes for long and short-term goals.   Revisions to Treatment Plan:  N/a  Teaching Needs: Safety, medications, dietary modifications, transfers, etc.   Current Barriers to Discharge: Decreased caregiver support and Home enviroment access/layout  Possible Resolutions to Barriers: Family education HH follow up services     Medical Summary Current Status: Chronic fibromyalgia pain, blood pressures mildly labile.  Arthralgias due to increased activity  Barriers to Discharge: Uncontrolled Pain   Possible Resolutions to Barriers/Weekly Focus: Medication management for pain.  Caregiver instruction   Continued Need for Acute Rehabilitation Level of Care: The patient requires daily medical management by a physician with specialized training in physical medicine and rehabilitation for the  following reasons: Direction of a multidisciplinary physical rehabilitation program to maximize functional independence : Yes Medical management of patient stability for increased activity during participation in an intensive rehabilitation regime.: Yes Analysis of laboratory values and/or radiology reports with any subsequent need for medication adjustment and/or medical intervention. : Yes   I attest that I was present, lead the team conference, and concur with the assessment and plan of the  team.   Chana Bode B 09/02/2022, 4:49 PM

## 2022-09-02 NOTE — Progress Notes (Signed)
Occupational Therapy Discharge Summary  Patient Details  Name: Jenny Acosta MRN: [id number] Date of Birth: [birth year]  Date of Discharge from OT service:September 02, 2022     Patient has met 10 of 11 long term goals due to improved activity tolerance, improved balance, ability to compensate for deficits, and improved coordination.  Patient to discharge at overall Modified Independent level (except for supervision with tub transfers and IADLs) Patient's care partner is independent to provide the necessary physical assistance at discharge.    Reasons goals not met: IADL goal NA d/t environmental barriers.   Recommendation:  Patient will benefit from ongoing skilled OT services in home health setting to continue to advance functional skills in the area of BADL and iADL.  Equipment: No DME needs  Reasons for discharge: treatment goals met  Patient/family agrees with progress made and goals achieved: Yes  OT Discharge Precautions/Restrictions   fall ADL ADL Eating: Independent Where Assessed-Eating: Edge of bed Grooming: Modified independent Where Assessed-Grooming: Standing at sink Upper Body Bathing: Modified independent Where Assessed-Upper Body Bathing: Shower Lower Body Bathing: Modified independent Where Assessed-Lower Body Bathing: Shower Upper Body Dressing: Independent Where Assessed-Upper Body Dressing: Edge of bed Lower Body Dressing: Modified independent Where Assessed-Lower Body Dressing: Edge of bed Toileting: Modified independent Where Assessed-Toileting: Teacher, adult education: Engineer, agricultural Method: Land) Acupuncturist: Engineer, technical sales: Close supervison Web designer Method: Ship broker: Insurance underwriter: Modified independent Film/video editor Method: Ambulating Vision Baseline Vision/History: 6 Macular Degeneration Patient Visual  Report: Blurring of vision;Peripheral vision impairment;Eye fatigue/eye pain/headache Vision Assessment?: Vision impaired- to be further tested in functional context Additional Comments: pt reports spots in vision d/t stroke Perception  Perception: Within Functional Limits Praxis Praxis: Intact Cognition Cognition Overall Cognitive Status: Within Functional Limits for tasks assessed Arousal/Alertness: Awake/alert Orientation Level: Person;Place;Situation Person: Oriented Place: Oriented Situation: Oriented Memory: Appears intact Awareness: Appears intact Problem Solving: Appears intact Safety/Judgment: Appears intact Brief Interview for Mental Status (BIMS) Repetition of Three Words (First Attempt): 3 Temporal Orientation: Year: Correct Temporal Orientation: Month: Accurate within 5 days Temporal Orientation: Day: Correct Recall: "Sock": Yes, no cue required Recall: "Blue": Yes, no cue required Recall: "Bed": Yes, no cue required BIMS Summary Score: 15 Sensation Sensation Light Touch: Appears Intact Hot/Cold: Appears Intact Proprioception: Appears Intact Stereognosis: Not tested Coordination Gross Motor Movements are Fluid and Coordinated: Yes Fine Motor Movements are Fluid and Coordinated: Yes Motor  Motor Motor: Within Functional Limits Mobility  Transfers Sit to Stand: Independent  Trunk/Postural Assessment  Cervical Assessment Cervical Assessment: Within Functional Limits Thoracic Assessment Thoracic Assessment: Exceptions to [facility] (rounded shoulders) Lumbar Assessment Lumbar Assessment: Within Functional Limits Postural Control Postural Control: Within Functional Limits  Balance Standardized Balance Assessment Standardized Balance Assessment: Berg Balance Test Berg Balance Test Sit to Stand: Able to stand without using hands and stabilize independently Standing Unsupported: Able to stand 2 minutes with supervision Sitting with Back Unsupported but Feet  Supported on Floor or Stool: Able to sit safely and securely 2 minutes Stand to Sit: Controls descent by using hands Transfers: Able to transfer safely, definite need of hands Standing Unsupported with Eyes Closed: Able to stand 10 seconds with supervision Standing Ubsupported with Feet Together: Able to place feet together independently and stand for 1 minute with supervision From Standing, Reach Forward with Outstretched Arm: Can reach forward >12 cm safely (5") From Standing Position, Pick up Object from Floor: Able to pick up shoe safely and  easily From Standing Position, Turn to Look Behind Over each Shoulder: Turn sideways only but maintains balance Turn 360 Degrees: Able to turn 360 degrees safely but slowly Standing Unsupported, Alternately Place Feet on Step/Stool: Able to complete 4 steps without aid or supervision Standing Unsupported, One Foot in Front: Loses balance while stepping or standing Standing on One Leg: Tries to lift leg/unable to hold 3 seconds but remains standing independently Total Score: 37 Static Sitting Balance Static Sitting - Balance Support: Feet supported Static Sitting - Level of Assistance: 7: Independent Extremity/Trunk Assessment RUE Assessment RUE Assessment: Within Functional Limits LUE Assessment LUE Assessment: Within Functional Limits   Barron Schmid 09/02/2022, 9:17 AM

## 2022-09-02 NOTE — Progress Notes (Signed)
Patient ID: Jenny Acosta, female   DOB: [birth year], 77 y.o.   MRN: [id number]  [facility] set for Thursday 7/11.

## 2022-09-02 NOTE — Progress Notes (Signed)
Physical Therapy Session Note  Patient Details  Name: Jenny Acosta MRN: [id number] Date of Birth: 1945/07/24  Today's Date: 09/02/2022 PT Individual Time: 4098-1191 PT Individual Time Calculation (min): 60 min   Short Term Goals: Week 1:  PT Short Term Goal 1 (Week 1): STG = LTG 2/2 ELOS  Skilled Therapeutic Interventions/Progress Updates:  Patient sidelying to L side in bed on entrance to room. Patient alert and agreeable to PT session. D/t progress made while in rehab, pt has reached level to be transitioned to Mod I in her room. Explained to pt that limitations are that she is only Mod I in room, must use rollator for all ambulation, and if she feels unsafe or "off" she should still call for staff supervision with toileting.   Patient with no pain complaint at start of session.  Therapeutic Activity: Bed Mobility: Pt performed supine <> sit with IND and no need for bedrail. No cueing required Transfers: Pt performed sit<>stand, stand pivot, and ambulatory toilet transfers throughout session with Mod I. No cueing required.   Pt provided with overview of w/c parts mgmt including leg rest attachment and elevation, brake mgmt, Collapsing of chair for storage, removal of seat cushion, swing back armrests. Pt confident she can teach dtr how to manage w/c for transport.   Pt with questions re: medications, HH expectations, progress. Provided with information that nursing/ medical team will go over medications with pt and dtr in morning. All other info provided to PT's best knowledge and clinical expertise. Recommendations to continue walking every day despite fibromyalgia pain in order to better manage fibromyalgia.   Patient seated EOB at end of session with brakes locked, no alarm set as pt now Mod I in room, and all needs within reach.  RN, NTs informed of pt's Mod I status.    Therapy Documentation Precautions:  Precautions Precautions: Fall Restrictions Weight Bearing  Restrictions: No General:   Vital Signs:  Pain:  No relation of pain this session.    Therapy/Group: Individual Therapy  Loel Dubonnet PT, DPT, CSRS 09/02/2022, 4:52 PM

## 2022-09-02 NOTE — Progress Notes (Signed)
Patient ID: Jenny Acosta, female   DOB: [birth year], 77 y.o.   MRN: [id number]  Patient established with [facility].

## 2022-09-02 NOTE — Discharge Summary (Signed)
Physician Discharge Summary  Patient ID: Jenny Acosta MRN: [id number] DOB/AGE: 07-01-1945 77 y.o.  Admit date: 08/27/2022 Discharge date: 09/03/2022  Discharge Diagnoses:  Principal Problem:   Cerebrovascular accident (CVA) due to occlusion of right posterior communicating artery (HCC) DVT prophylaxis Hyperlipidemia Hypothyroidism Mood stabilization Fibromyalgia Possible liver cirrhosis basically irregularity of liver border Ascending aortic aneurysm 3 mm focal subpleural nodular consolidation right middle lobe Obesity GERD Constipation  Discharged Condition: Stable  Significant Diagnostic Studies: CT THORACIC SPINE WO CONTRAST  Result Date: 08/23/2022 CLINICAL DATA:  Possible left L2 transverse process fracture on MR EXAM: CT THORACIC SPINE WITHOUT CONTRAST TECHNIQUE: Multidetector CT images of the thoracic were obtained using the standard protocol without intravenous contrast. RADIATION DOSE REDUCTION: This exam was performed according to the departmental dose-optimization program which includes automated exposure control, adjustment of the mA and/or kV according to patient size and/or use of iterative reconstruction technique. COMPARISON:  CTA chest dated 08/21/2022 FINDINGS: Alignment: Mild reverse S-shaped thoracic scoliosis. Normal thoracic kyphosis. Vertebrae: No acute fracture or focal pathologic process. Specifically, the left T2 transverse process is intact. Paraspinal and other soft tissues: Better evaluated on recent CTA chest. Atherosclerotic calcifications. Prior cholecystectomy. Disc levels: Mild multilevel degenerative changes. Spinal canal is patent. IMPRESSION: No fracture is seen. Specifically, the left T2 transverse process is intact. Electronically Signed   By: Charline Bills M.D.   On: 08/23/2022 20:25   ECHOCARDIOGRAM COMPLETE  Result Date: 08/23/2022    ECHOCARDIOGRAM REPORT   Patient Name:   Jenny Acosta Date of Exam: 08/23/2022 Medical Rec #:  [id number]       Height:       65.0 in Accession #:    [id number]     Weight:       220.0 lb Date of Birth:  [birth year]      BSA:          2.060 m Patient Age:    77 years       BP:           135/58 mmHg Patient Gender: F              HR:           73 bpm. Exam Location:  Inpatient Procedure: 2D Echo, Color Doppler and Cardiac Doppler Indications:     Stroke i63.9  History:         Patient has prior history of Echocardiogram examinations, most                  recent 10/17/2020. Risk Factors:Hypertension and Dyslipidemia.  Sonographer:     Irving Burton Senior RDCS Referring Phys:  [id number] Alba Cory Diagnosing Phys: Lennie Odor MD  Sonographer Comments: Technically difficult due to poor echo windows. IMPRESSIONS  1. Left ventricular ejection fraction, by estimation, is 65 to 70%. The left ventricle has normal function. The left ventricle has no regional wall motion abnormalities. Left ventricular diastolic parameters are consistent with Grade I diastolic dysfunction (impaired relaxation).  2. Right ventricular systolic function is normal. The right ventricular size is normal. Tricuspid regurgitation signal is inadequate for assessing PA pressure.  3. The mitral valve is normal in structure. Trivial mitral valve regurgitation.  4. The aortic valve was not well visualized. Aortic valve regurgitation is not visualized.  5. Ascending aortic aneurysm up to 48 mm. 53 mm on recent CT imaging. Aortic dilatation noted. Aneurysm of the ascending aorta, measuring 48 mm.  6. The inferior vena cava is normal in size with  greater than 50% respiratory variability, suggesting right atrial pressure of 3 mmHg. Conclusion(s)/Recommendation(s): No intracardiac source of embolism detected on this transthoracic study. Consider a transesophageal echocardiogram to exclude cardiac source of embolism if clinically indicated. FINDINGS  Left Ventricle: Left ventricular ejection fraction, by estimation, is 65 to 70%. The left ventricle has normal function. The  left ventricle has no regional wall motion abnormalities. The left ventricular internal cavity size was normal in size. There is  no left ventricular hypertrophy. Left ventricular diastolic parameters are consistent with Grade I diastolic dysfunction (impaired relaxation). Right Ventricle: The right ventricular size is normal. No increase in right ventricular wall thickness. Right ventricular systolic function is normal. Tricuspid regurgitation signal is inadequate for assessing PA pressure. Left Atrium: Left atrial size was normal in size. Right Atrium: Right atrial size was normal in size. Pericardium: Trivial pericardial effusion is present. Presence of epicardial fat layer. Mitral Valve: The mitral valve is normal in structure. Trivial mitral valve regurgitation. Tricuspid Valve: The tricuspid valve is grossly normal. Tricuspid valve regurgitation is not demonstrated. No evidence of tricuspid stenosis. Aortic Valve: The aortic valve was not well visualized. Aortic valve regurgitation is not visualized. Pulmonic Valve: The pulmonic valve was grossly normal. Pulmonic valve regurgitation is not visualized. No evidence of pulmonic stenosis. Aorta: Ascending aortic aneurysm up to 48 mm. 53 mm on recent CT imaging. Aortic dilatation noted. There is an aneurysm involving the ascending aorta measuring 48 mm. Venous: The inferior vena cava is normal in size with greater than 50% respiratory variability, suggesting right atrial pressure of 3 mmHg. IAS/Shunts: The atrial septum is grossly normal.  LEFT VENTRICLE PLAX 2D LVIDd:         4.20 cm   Diastology LVIDs:         2.70 cm   LV e' medial:    4.57 cm/s LV PW:         1.00 cm   LV E/e' medial:  15.7 LV IVS:        0.90 cm   LV e' lateral:   5.44 cm/s LVOT diam:     2.20 cm   LV E/e' lateral: 13.2 LV SV:         78 LV SV Index:   38 LVOT Area:     3.80 cm  RIGHT VENTRICLE RV S prime:     10.40 cm/s TAPSE (M-mode): 2.2 cm LEFT ATRIUM             Index        RIGHT ATRIUM            Index LA diam:        4.20 cm 2.04 cm/m   RA Area:     13.00 cm LA Vol (A2C):   56.0 ml 27.19 ml/m  RA Volume:   26.90 ml  13.06 ml/m LA Vol (A4C):   53.3 ml 25.88 ml/m LA Biplane Vol: 55.1 ml 26.75 ml/m  AORTIC VALVE LVOT Vmax:   95.30 cm/s LVOT Vmean:  71.700 cm/s LVOT VTI:    0.204 m  AORTA Ao Root diam: 3.80 cm Ao Asc diam:  4.70 cm MITRAL VALVE MV Area (PHT): 2.05 cm     SHUNTS MV Decel Time: 370 msec     Systemic VTI:  0.20 m MV E velocity: 71.90 cm/s   Systemic Diam: 2.20 cm MV A velocity: 105.00 cm/s MV E/A ratio:  0.68 Lennie Odor MD Electronically signed by Lennie Odor MD Signature Date/Time: 08/23/2022/4:06:58 PM  Final (Updated)    VAS US CAROTID (at [facility] and [facility] only)  Result Date: 08/22/2022 Carotid Arterial Duplex Study Patient Name:  [facility] [place]  Date of Exam:   08/22/2022 Medical Rec #: [id number]       Accession #:    [id number] Date of Birth: 03-23-1945       Patient Gender: F Patient Age:   61 years Exam Location:  [facility] Procedure:      VAS US CAROTID Referring Phys: Hartley Barefoot --------------------------------------------------------------------------------  Indications:       CVA. Risk Factors:      Hyperlipidemia. Limitations        Today's exam was limited due to the body habitus of the                    patient and the patient's respiratory variation. Comparison Study:  No prior studies. Performing Technologist: Chanda Busing RVT  Examination Guidelines: A complete evaluation includes B-mode imaging, spectral Doppler, color Doppler, and power Doppler as needed of all accessible portions of each vessel. Bilateral testing is considered an integral part of a complete examination. Limited examinations for reoccurring indications may be performed as noted.  Right Carotid Findings: +----------+--------+--------+--------+-----------------------+--------+           PSV cm/sEDV cm/sStenosisPlaque Description     Comments  +----------+--------+--------+--------+-----------------------+--------+ CCA Prox  53      8               smooth and heterogenous         +----------+--------+--------+--------+-----------------------+--------+ CCA Distal61      10              smooth and heterogenous         +----------+--------+--------+--------+-----------------------+--------+ ICA Prox  53      9               smooth and heterogenoustortuous +----------+--------+--------+--------+-----------------------+--------+ ICA Mid   48      10                                     tortuous +----------+--------+--------+--------+-----------------------+--------+ ICA Distal51      12                                     tortuous +----------+--------+--------+--------+-----------------------+--------+ ECA       76      10                                              +----------+--------+--------+--------+-----------------------+--------+ +----------+--------+-------+--------+-------------------+           PSV cm/sEDV cmsDescribeArm Pressure (mmHG) +----------+--------+-------+--------+-------------------+ [id number]                                        +----------+--------+-------+--------+-------------------+ +---------+--------+--+--------+-+---------+ VertebralPSV cm/s32EDV cm/s7Antegrade +---------+--------+--+--------+-+---------+  Left Carotid Findings: +----------+--------+--------+--------+-----------------------+--------+           PSV cm/sEDV cm/sStenosisPlaque Description     Comments +----------+--------+--------+--------+-----------------------+--------+ CCA Prox  73      16              smooth and heterogenous         +----------+--------+--------+--------+-----------------------+--------+  CCA Distal61      12              smooth and heterogenous         +----------+--------+--------+--------+-----------------------+--------+ ICA Prox  66      20               smooth and heterogenoustortuous +----------+--------+--------+--------+-----------------------+--------+ ICA Mid   56      15                                     tortuous +----------+--------+--------+--------+-----------------------+--------+ ICA Distal48      15                                     tortuous +----------+--------+--------+--------+-----------------------+--------+ ECA       204     20                                              +----------+--------+--------+--------+-----------------------+--------+ +----------+--------+--------+--------+-------------------+           PSV cm/sEDV cm/sDescribeArm Pressure (mmHG) +----------+--------+--------+--------+-------------------+ [id number]                                         +----------+--------+--------+--------+-------------------+ +---------+--------+--+--------+--+---------+ VertebralPSV cm/s45EDV cm/s10Antegrade +---------+--------+--+--------+--+---------+   Summary: Right Carotid: Velocities in the right ICA are consistent with a 1-39% stenosis. Left Carotid: Velocities in the left ICA are consistent with a 1-39% stenosis. Vertebrals: Bilateral vertebral arteries demonstrate antegrade flow. *See table(s) above for measurements and observations.  Electronically signed by Coral Else MD on 08/22/2022 at 10:46:13 PM.    Final    CT ANGIO HEAD NECK W WO CM  Result Date: 08/22/2022 CLINICAL DATA:  Neuro deficit, acute, stroke suspected EXAM: CT ANGIOGRAPHY HEAD AND NECK WITH AND WITHOUT CONTRAST TECHNIQUE: Multidetector CT imaging of the head and neck was performed using the standard protocol during bolus administration of intravenous contrast. Multiplanar CT image reconstructions and MIPs were obtained to evaluate the vascular anatomy. Carotid stenosis measurements (when applicable) are obtained utilizing NASCET criteria, using the distal internal carotid diameter as the denominator. RADIATION DOSE  REDUCTION: This exam was performed according to the departmental dose-optimization program which includes automated exposure control, adjustment of the mA and/or kV according to patient size and/or use of iterative reconstruction technique. CONTRAST:  80mL OMNIPAQUE IOHEXOL 350 MG/ML SOLN COMPARISON:  None Available. FINDINGS: CT HEAD FINDINGS Brain: Known acute infarcts the cerebellum better characterized on same day MRI. No acute hemorrhage or progressive mass effect. Additional remote infarcts and chronic microvascular ischemic disease. Cerebral atrophy. No hydrocephalus or most loose improved Vascular: See below. Skull: No acute fracture. Sinuses/Orbits: Clear sinuses.  No acute orbital findings. Other: No mastoid effusions. Review of the MIP images confirms the above findings CTA NECK FINDINGS Aortic arch: Partially imaged ascending aortic aneurysm better characterized on recent CTA chest. Great vessel origins are patent without significant stenosis. Right carotid system: Atherosclerosis involving the proximal ICA with approximately 50% stenosis. Left carotid system: Atherosclerosis involving the proximal ICA without greater than 50% stenosis. Vertebral arteries: Left dominant. No evidence of hemodynamically significant stenosis in the neck. Skeleton:  No acute abnormality. Other neck: No acute abnormality. Upper chest: Visualized lung apices are clear. Review of the MIP images confirms the above findings CTA HEAD FINDINGS Anterior circulation: Right A1 ACA is hypoplastic bilateral intracranial ICAs, MCAs, and ACAs are patent. Moderate right M1 MCA stenosis. Posterior circulation: Moderate proximal and severe distal right intradural vertebral artery stenosis. Mild left intradural vertebral artery stenosis. Severe (nearly occlusive) medium distance stenosis of the mid basilar artery with suggestion of eccentric low-density and distal tapering. The posterior cerebral arteries are patent Venous sinuses: As  permitted by contrast timing, patent. Review of the MIP images confirms the above findings IMPRESSION: 1. Severe (nearly occlusive) basilar artery stenosis with an appearance that could reflect dissection (particularly given significant progression since 2015 MRI), although atherosclerotic disease could have a similar appearance. 2. Moderate right M1 MCA stenosis. 3. Approximately 50% stenosis of the proximal right ICA in the neck. 4. Partially imaged ascending aortic aneurysm better characterized on recent CTA chest. Electronically Signed   By: Feliberto Harts M.D.   On: 08/22/2022 14:59   MR ANGIO HEAD WO CONTRAST  Addendum Date: 08/22/2022   ADDENDUM REPORT: 08/22/2022 13:22 ADDENDUM: These results were called by telephone at the time of interpretation on 08/22/2022 at 12:25 pm to provider [facility] , who verbally acknowledged these results. Electronically Signed   By: Orvan Falconer M.D.   On: 08/22/2022 13:22   Result Date: 08/22/2022 CLINICAL DATA:  Neuro deficit, acute, stroke suspected. EXAM: MRA HEAD WITHOUT CONTRAST TECHNIQUE: Angiographic images of the Circle of Willis were acquired using MRA technique without intravenous contrast. COMPARISON:  MRI brain 08/22/2022. FINDINGS: Moderately motion degraded study.  Within this limitation: Anterior circulation: Intracranial ICAs are patent without significant stenosis. The proximal ACAs and MCAs are patent without significant stenosis. Distal branches are symmetric. Posterior circulation: Severe stenosis of the mid basilar artery with proximal tapering, suspicious for dissection versus thrombus. The SCAs and PICAs are patent proximally. The AICAs are not well visualized, possibly secondary to motion artifact. The PCAs are patent proximally without significant stenosis. Distal branches are symmetric. Anatomic variants: Persistent fetal origin of the left PCA with hypoplastic left P1 segment. Other: None. IMPRESSION: Moderately motion degraded  study. Within this limitation, severe stenosis of the mid basilar artery with proximal tapering, suspicious for dissection versus thrombus. Contrast-enhanced vessel imaging may be helpful in distinguishing the etiology of this stenosis. Radiology assistant personnel have been notified to put me in telephone contact with the referring physician or the referring physician's clinical representative in order to discuss these findings. Once this communication is established I will issue an addendum to this report for documentation purposes. Electronically Signed: By: Orvan Falconer M.D. On: 08/22/2022 12:14   MR BRAIN WO CONTRAST  Result Date: 08/22/2022 CLINICAL DATA:  77 year old female with syncope. Altered mental status. Weakness and dizziness. Vomiting. EXAM: MRI HEAD WITHOUT CONTRAST TECHNIQUE: Multiplanar, multiecho pulse sequences of the brain and surrounding structures were obtained without intravenous contrast. COMPARISON:  CT head and cervical spine yesterday. Previous brain MRI 08/23/2013. FINDINGS: Brain: Patchy, confluent restricted diffusion in the right cerebellum SCA territory. Small areas of acute ischemia involving the midline cerebellar vermis (series 11, image 11). And small patchy areas of acute infarct also in the right occipital pole, cortex along the right parieto-occipital sulcus. Right thalamus is spared. No brainstem restricted diffusion. No anterior circulation restricted diffusion. Associated cytotoxic edema most pronounced in the right cerebellum SCA territory. No hemorrhagic transformation. No mass effect. Chronic encephalomalacia in  the posterior right MCA territory is new since 2015 with mild associated hemosiderin. Widespread patchy bilateral cerebral white matter T2 and FLAIR hyperintensity has progressed since the previous MRI also. Chronic lacunar infarct of the left thalamus is new since 2015. Comparatively mild basal ganglia and pontine T2 heterogeneity. No midline shift, mass  effect, evidence of mass lesion, ventriculomegaly, extra-axial collection or acute intracranial hemorrhage. Cervicomedullary junction and pituitary are within normal limits. Vascular: Major intracranial vascular flow voids are stable since 2015. Chronic intracranial artery tortuosity, including vertebrobasilar dolichoectasia. Skull and upper cervical spine: C3-C4 acquired or congenital ankylosis. Otherwise negative for age visible cervical spine. Visualized bone marrow signal is within normal limits. Sinuses/Orbits: Postoperative changes to both globes since 2015. Paranasal Visualized paranasal sinuses and mastoids are stable and well aerated. Other: Visible internal auditory structures appear normal. Negative visible scalp and face. IMPRESSION: 1. Positive for acute infarcts in the Right cerebellum (right SCA territory and vermis), and mildly scattered in the right PCA territory. No hemorrhagic transformation or mass effect. 2. Chronic posterior right MCA infarct is new since the 2015 brain MRI, and moderately progressed other chronic small vessel disease since that time. Electronically Signed   By: Odessa Fleming M.D.   On: 08/22/2022 06:54   CT Angio Chest/Abd/Pel for Dissection W and/or Wo Contrast  Result Date: 08/21/2022 CLINICAL DATA:  Acute aortic syndrome suspected. EXAM: CT ANGIOGRAPHY CHEST, ABDOMEN AND PELVIS TECHNIQUE: Non-contrast CT of the chest was initially obtained. Multidetector CT imaging through the chest, abdomen and pelvis was performed using the standard protocol during bolus administration of intravenous contrast. Multiplanar reconstructed images and MIPs were obtained and reviewed to evaluate the vascular anatomy. RADIATION DOSE REDUCTION: This exam was performed according to the departmental dose-optimization program which includes automated exposure control, adjustment of the mA and/or kV according to patient size and/or use of iterative reconstruction technique. CONTRAST:  OMNIPAQUE  IOHEXOL 350 MG/ML SOLN COMPARISON:  Chest CT dated 11/06/2017 and CT abdomen pelvis dated 08/03/2010. FINDINGS: Evaluation of this exam is limited due to respiratory motion artifact. CTA CHEST FINDINGS Cardiovascular: There is no cardiomegaly or pericardial effusion. Coronary vascular calcification predominately involving the LAD and RCA. Similar appearance of ascending aortic aneurysm measuring up to 5.3 cm. No aortic dissection. There is moderate atherosclerotic calcification of the thoracic aorta. The origins of the great vessels of the aortic arch appear patent. Evaluation of the pulmonary arteries is limited due to respiratory motion. No central pulmonary artery embolus identified. Mediastinum/Nodes: No hilar or mediastinal adenopathy. The esophagus is grossly unremarkable. No mediastinal fluid collection. Lungs/Pleura: Faint 2 mm left apical nodule (19/9) similar to prior CT. Mild diffuse interstitial coarsening and areas of air trapping may be related to underlying small vessel versus small airway disease. A 3 cm focal subpleural nodular consolidation in the right middle lobe may represent atelectasis/scarring. Developing infiltrate or an area of infarct are not excluded. Attention on follow-up imaging recommended. There is no pleural effusion or pneumothorax. The central airways are patent. Musculoskeletal: Osteopenia with degenerative changes of the spine. No acute osseous pathology. Review of the MIP images confirms the above findings. CTA ABDOMEN AND PELVIS FINDINGS VASCULAR Aorta: Advanced atherosclerotic calcification of the abdominal aorta. No aneurysmal dilatation or dissection. No periaortic fluid collection. Celiac: The celiac trunk and its major branches are patent. No aneurysmal dilatation or dissection. SMA: The SMA is patent.  No aneurysmal dilatation or dissection. Renals: The renal arteries are patent.  No aneurysm. IMA: The IMA is patent. Inflow:  Moderate atherosclerotic calcification of the  iliac arteries. No aneurysmal dilatation or dissection. The iliac arteries are patent bilaterally. Veins: No obvious venous abnormality within the limitations of this arterial phase study. Review of the MIP images confirms the above findings. NON-VASCULAR No intra-abdominal free air or free fluid. Hepatobiliary: There is irregularity of the liver contour suggestive of cirrhosis. Clinical correlation is recommended. No biliary dilatation. Cholecystectomy. Pancreas: Unremarkable. No pancreatic ductal dilatation or surrounding inflammatory changes. Spleen: Normal in size without focal abnormality. Adrenals/Urinary Tract: The adrenal glands unremarkable. There is no hydronephrosis on either side. Symmetric excretion of contrast by both kidneys. The visualized ureters and urinary bladder appear unremarkable. Stomach/Bowel: There is large amount of stool throughout the colon. No bowel obstruction or active inflammation. The appendix is not visualized with certainty. No inflammatory changes identified in the right lower quadrant. Lymphatic: No adenopathy. Reproductive: The uterus is anteverted and grossly unremarkable. No adnexal masses. Other: None Musculoskeletal: Osteopenia with degenerative changes of the spine. Grade 1 L4-L5 anterolisthesis. L2 hemangioma. No acute osseous pathology. Review of the MIP images confirms the above findings. IMPRESSION: 1. No acute intrathoracic, abdominal, or pelvic pathology. 2. Similar appearance of ascending aortic aneurysm measuring up to 5.3 cm. Ascending thoracic aortic aneurysm. Recommend semi-annual imaging followup by CTA or MRA and referral to cardiothoracic surgery if not already obtained. This recommendation follows 2010 ACCF/AHA/AATS/ACR/ASA/SCA/SCAI/SIR/STS/SVM Guidelines for the Diagnosis and Management of Patients With Thoracic Aortic Disease. Circulation. 2010; 121: Z610-R604. Aortic aneurysm NOS (ICD10-I71.9). No aortic dissection. 3. A 3 cm focal subpleural nodular  consolidation in the right middle lobe may represent atelectasis/scarring. Developing infiltrate or an area of infarct are not excluded. Attention on follow-up imaging recommended. 4. Cirrhosis. 5. Constipation. No bowel obstruction. 6.  Aortic Atherosclerosis (ICD10-I70.0). Electronically Signed   By: Elgie Collard M.D.   On: 08/21/2022 18:37   CT Head Wo Contrast  Result Date: 08/21/2022 CLINICAL DATA:  Mental status change, unknown cause; Polytrauma, blunt EXAM: CT HEAD WITHOUT CONTRAST CT CERVICAL SPINE WITHOUT CONTRAST TECHNIQUE: Multidetector CT imaging of the head and cervical spine was performed following the standard protocol without intravenous contrast. Multiplanar CT image reconstructions of the cervical spine were also generated. RADIATION DOSE REDUCTION: This exam was performed according to the departmental dose-optimization program which includes automated exposure control, adjustment of the mA and/or kV according to patient size and/or use of iterative reconstruction technique. COMPARISON:  CT Chest 11/06/17 FINDINGS: CT HEAD FINDINGS Brain: Chronic infarct in the right parietal temporal region sequela of moderate microvascular ischemic change. No CT evidence of an acute cortical infarct. No hemorrhage. No hydrocephalus. No extra-axial fluid collection generalized volume loss Vascular: No hyperdense vessel or unexpected calcification. Skull: Normal. Negative for fracture or focal lesion. Sinuses/Orbits: No middle ear or mastoid effusion air-fluid level in the right sphenoid sinus orbits are notable for bilateral lens replacement, otherwise unremarkable. Other: None. CT CERVICAL SPINE FINDINGS Alignment: Straightening of the normal cervical lordosis. Skull base and vertebrae: No acute cervical spine fracture. No primary bone lesion or focal pathologic process. Likely an osseous hemangioma at T2. possible acute fracture of the left T2 transverse process (series 4, image 26) Soft tissues and  spinal canal: No prevertebral fluid or swelling. No visible canal hematoma. Disc levels:  No evidence of high-grade spinal canal stenosis Upper chest: Biapical ground-glass opacities are favored to represent infectious or inflammatory process. Other: None IMPRESSION: 1. No acute intracranial abnormality. Chronic infarct in the right parietotemporal region. 2. No acute cervical spine fracture. Possible  acute fracture of the left T2 transverse process. 3. Biapical ground-glass opacities are favored to represent infectious or inflammatory process. Electronically Signed   By: Lorenza Cambridge M.D.   On: 08/21/2022 14:23   CT Cervical Spine Wo Contrast  Result Date: 08/21/2022 CLINICAL DATA:  Mental status change, unknown cause; Polytrauma, blunt EXAM: CT HEAD WITHOUT CONTRAST CT CERVICAL SPINE WITHOUT CONTRAST TECHNIQUE: Multidetector CT imaging of the head and cervical spine was performed following the standard protocol without intravenous contrast. Multiplanar CT image reconstructions of the cervical spine were also generated. RADIATION DOSE REDUCTION: This exam was performed according to the departmental dose-optimization program which includes automated exposure control, adjustment of the mA and/or kV according to patient size and/or use of iterative reconstruction technique. COMPARISON:  CT Chest 11/06/17 FINDINGS: CT HEAD FINDINGS Brain: Chronic infarct in the right parietal temporal region sequela of moderate microvascular ischemic change. No CT evidence of an acute cortical infarct. No hemorrhage. No hydrocephalus. No extra-axial fluid collection generalized volume loss Vascular: No hyperdense vessel or unexpected calcification. Skull: Normal. Negative for fracture or focal lesion. Sinuses/Orbits: No middle ear or mastoid effusion air-fluid level in the right sphenoid sinus orbits are notable for bilateral lens replacement, otherwise unremarkable. Other: None. CT CERVICAL SPINE FINDINGS Alignment: Straightening  of the normal cervical lordosis. Skull base and vertebrae: No acute cervical spine fracture. No primary bone lesion or focal pathologic process. Likely an osseous hemangioma at T2. possible acute fracture of the left T2 transverse process (series 4, image 26) Soft tissues and spinal canal: No prevertebral fluid or swelling. No visible canal hematoma. Disc levels:  No evidence of high-grade spinal canal stenosis Upper chest: Biapical ground-glass opacities are favored to represent infectious or inflammatory process. Other: None IMPRESSION: 1. No acute intracranial abnormality. Chronic infarct in the right parietotemporal region. 2. No acute cervical spine fracture. Possible acute fracture of the left T2 transverse process. 3. Biapical ground-glass opacities are favored to represent infectious or inflammatory process. Electronically Signed   By: Lorenza Cambridge M.D.   On: 08/21/2022 14:23   DG Chest Portable 1 View  Result Date: 08/21/2022 CLINICAL DATA:  Weakness.  Dizziness.  Known aneurysm EXAM: PORTABLE CHEST 1 VIEW COMPARISON:  X-ray 10/27/2017. FINDINGS: Increasing widening of the mediastinum. Borderline cardiopericardial silhouette. No consolidation, pneumothorax or effusion. No edema. Films are under penetrated. Overlapping cardiac leads. IMPRESSION: Increasing widening of the mediastinum. With patient's history recommend further workup with chest CT when clinically appropriate. Electronically Signed   By: Karen Kays M.D.   On: 08/21/2022 14:02    Labs:  Basic Metabolic Panel: Recent Labs  Lab 08/28/22 0642  NA 138  K 4.1  CL 103  CO2 25  GLUCOSE 115*  BUN 12  CREATININE 0.78  CALCIUM 8.8*    CBC: Recent Labs  Lab 08/28/22 0642  WBC 7.5  NEUTROABS 3.2  HGB 13.7  HCT 43.1  MCV 89.4  PLT 224    CBG: No results for input(s): "GLUCAP" in the last 168 hours.  Family history.  Mother with colon cancer and CVA.  Denies any stomach cancer or rectal cancer  Brief HPI:   Jenny Acosta is a 77 y.o. right-handed female with history significant for carotid stenosis ascending thoracic aortic aneurysm, hyperlipidemia hypothyroidism hypertension.  Per chart review lives alone independent living facility.  Ambulates with a rollator for community distances.  Reports 1 fall in the past 6 months.  She does have a daughter in the area to provide assistance.  Presented 08/21/2022 with weakness dizziness sustaining a fall.  Patient had developed some lightheadedness and her legs gave out.  Denied loss of consciousness.  She was able to get up on her own.  CT/MRI positive for acute infarction in the right cerebellum right SCA territory and vermis and mildly scattered in the right PCA territory.  Chronic posterior right MCA infarct new since 2015 brain MRI.  CTA of head and neck severely near occlusive basilar artery stenosis with appearance that could reflect possible dissection particularly given progression since 2015.  Moderate right M1 MCA stenosis.  Approximately 50% stenosis of the proximal right ICA in the neck.  Partially imaged ascending aortic aneurysm.  Scans were reviewed by neurology dissection was doubtful.  CT thoracic spine no fracture seen.  Admission chemistries unremarkable troponin negative.  Echocardiogram with ejection fraction of 60 to 65% no wall motion abnormalities grade 1 diastolic dysfunction.  Maintained on low-dose aspirin and Plavix for CVA prophylaxis.  Neurology was considering Brilinta if she was able to afford it.  Subcutaneous heparin added for DVT prophylaxis.  Patient did complete a course of Augmentin for question aspiration in the setting of vomiting episode and currently remained stable.  Therapy evaluations completed due to patient decreased functional mobility was admitted for a comprehensive rehab program.   Hospital Course: Jenny Acosta was admitted to rehab 08/27/2022 for inpatient therapies to consist of PT, ST and OT at least three hours five days a  week. Past admission physiatrist, therapy team and rehab RN have worked together to provide customized collaborative inpatient rehab.  Pertaining to patient's right occipital and cerebellum infarct severe basilar artery stenosis likely from atherosclerosis.  She will continue aspirin and Plavix x 3 months then aspirin alone for now with follow neurology services.  Neurology was considering Brilinta if she could afford it.  History of fibromyalgia using tramadol as well as as needed Zanaflex.  Lidoderm patches were added for assistance in pain.  Mood stabilization with Pamelor as well as Paxil.  Crestor ongoing for hyperlipidemia.  Hormone supplement for hypothyroidism.  Ascending aortic aneurysm measuring 5.3 cm follow-up outpatient vascular surgery with repeat imaging in 6 months.  There was a 3 mm focal subpleural nodular consolidation right middle lobe repeat imaging 4 to 6 weeks PCP Dr. Oliver Barre can follow-up with this given patient's short length of stay on rehab.  Obesity BMI 39.44 dietary follow-up.  Protonix ongoing for GERD.   Blood pressures were monitored on TID basis and controlled     Rehab course: During patient's stay in rehab weekly team conferences were held to monitor patient's progress, set goals and discuss barriers to discharge. At admission, patient required minimal assist 60 feet rolling walker minimal assist step pivot transfers  Physical exam.  Blood pressure 118/70 pulse 80 temperature 98 respirations 18 oxygen saturation is 92% room air Constitutional.  No acute distress HEENT Head.  Normocephalic and atraumatic Eyes.  Pupils round and reactive to light no discharge without nystagmus Neck.  Supple nontender no JVD without thyromegaly Cardiac regular rate and rhythm without any extra sounds or murmur heard Abdomen.  Soft nontender positive bowel sounds without rebound Respiratory effort normal no respiratory distress without wheeze Neurologic.  Alert oriented x 3 fair  insight and awareness.  Strength 5/5 throughout  He/She  has had improvement in activity tolerance, balance, postural control as well as ability to compensate for deficits. He/She has had improvement in functional use RUE/LUE  and RLE/LLE as well as improvement in  awareness.  Perform sit to stand and stand pivot transfers throughout sessions with and without rollator at supervision level.  Ambulates 150 feet x 2 using rollator.  Ambulates around in her room with rolling walker and distant supervision.  Gathers her belongings for activities of daily but homemaking.  Full family teaching completed plan discharge to home       Disposition: Discharge to home   Diet: Regular  Special Instructions: No driving smoking or alcohol  Follow-up outpatient with PCP Dr. Tresa Endo for repeat imaging 4 to 6 weeks of a 3 mm focal subpleural nodular consolidation right middle lobe  Medications at discharge 1.  Tylenol as needed 2.  Aspirin 81 mg p.o. daily 3.  Plavix 75 mg p.o. daily 4.  Mucinex 600 mg p.o. twice daily 5.  Synthroid 100 mcg p.o. daily 6.  Lidoderm patch changes directed 7.  Claritin 10 mg p.o. daily 8.  Antivert 25 mg p.o. 3 times daily as needed dizziness 9.  Pamelor 20 mg p.o. bedtime 10.  Protonix 40 mg p.o. daily 11.  Paxil 30 mg p.o. daily 12.  MiraLAX daily hold for loose stools 13.  Zanaflex 2 mg p.o. every 8 hours as needed muscle spasms 14.  Tramadol 50 mg p.o. every 6 hours as needed pain 15.  Crestor 40 mg p.o. daily 16.  Albuterol inhaler as needed 17.Zetia 10 mg daily   30-35 minutes were spent completing discharge summary and discharge planning   Discharge Instructions     Ambulatory referral to Neurology   Complete by: As directed    An appointment is requested in approximately: 4 weeks right occipital and cerebellum infarction   Ambulatory referral to Physical Medicine Rehab   Complete by: As directed    Moderate complexity follow up 1-2 weeks right  occipital and cerebellar infarction      Allergies as of 09/03/2022       Reactions   Lipitor [atorvastatin]    myalgia   Methocarbamol Other (See Comments)   hallucinations   Simvastatin    Hair loss   Sulfa Antibiotics    unknown        Medication List     STOP taking these medications    ANTIHISTAMINE PO       TAKE these medications    acetaminophen 500 MG tablet Commonly known as: TYLENOL Take 500 mg by mouth every 6 (six) hours as needed. For pain   albuterol 108 (90 Base) MCG/ACT inhaler Commonly known as: VENTOLIN HFA TAKE 2 PUFFS BY MOUTH EVERY 6 HOURS AS NEEDED FOR WHEEZE OR SHORTNESS OF BREATH What changed: See the new instructions.   aspirin 81 MG chewable tablet Chew 1 tablet (81 mg total) by mouth daily.   clopidogrel 75 MG tablet Commonly known as: PLAVIX Take 1 tablet (75 mg total) by mouth daily.   ezetimibe 10 MG tablet Commonly known as: ZETIA Take 1 tablet (10 mg total) by mouth daily.   levothyroxine 100 MCG tablet Commonly known as: SYNTHROID Take 100 mcg by mouth daily before breakfast.   lidocaine 5 % Commonly known as: LIDODERM Place 2 patches onto the skin daily. Remove & Discard patch within 12 hours or as directed by MD   loratadine 10 MG tablet Commonly known as: CLARITIN Take 1 tablet (10 mg total) by mouth daily.   meclizine 25 MG tablet Commonly known as: ANTIVERT Take 1 tablet (25 mg total) by mouth 3 (three) times daily as needed for dizziness.  MUCUS RELIEF ADULT PO Take by mouth.   nortriptyline 10 MG capsule Commonly known as: PAMELOR Take 2 capsules (20 mg total) by mouth at bedtime. What changed: See the new instructions.   OMEGA-3 COMPLEX PO Take by mouth.   pantoprazole 40 MG tablet Commonly known as: PROTONIX Take 1 tablet (40 mg total) by mouth daily.   PARoxetine 30 MG tablet Commonly known as: PAXIL Take 1 tablet (30 mg total) by mouth daily.   polyethylene glycol 17 g packet Commonly  known as: MIRALAX / GLYCOLAX Take 17 g by mouth daily.   Thera-D 2000 50 MCG (2000 UT) Tabs Generic drug: Cholecalciferol 1 tab by mouth epr day What changed:  how much to take how to take this when to take this additional instructions   tiZANidine 2 MG tablet Commonly known as: ZANAFLEX Take 1 tablet (2 mg total) by mouth every 8 (eight) hours as needed for muscle spasms.   traMADol 50 MG tablet Commonly known as: ULTRAM Take 1 tablet (50 mg total) by mouth every 6 (six) hours as needed.        Follow-up Information     Kirsteins, Victorino Sparrow, MD Follow up.   Specialty: Physical Medicine and Rehabilitation Why: Office to call for appointment Contact information: [address] [place] [address] [phone]         [name], MD Follow up.   Specialties: Internal Medicine, Radiology Why: Call for appointment Contact information: [address] [place] [address] [phone]                 Signed: Charlton Amor 09/03/2022, 5:16 AM

## 2022-09-02 NOTE — Progress Notes (Signed)
PROGRESS NOTE   Subjective/Complaints:  Patient without new complaints.  She has had increased soreness of her knees she feels like it is in her joints.  She states that her activity level is higher on the rehab unit than it is at home.  She was concerned that her soreness may be due to Zetia.  We discussed that this is not a statin and that it typically does not cause muscle pains (has low incidence of arthralgia)  ROS: Patient denies chest pains shortness of breath nausea vomiting diarrhea or constipation  Objective:   No results found. No results for input(s): "WBC", "HGB", "HCT", "PLT" in the last 72 hours.  No results for input(s): "NA", "K", "CL", "CO2", "GLUCOSE", "BUN", "CREATININE", "CALCIUM" in the last 72 hours.   Intake/Output Summary (Last 24 hours) at 09/02/2022 1234 Last data filed at 09/02/2022 0811 Gross per 24 hour  Intake 480 ml  Output --  Net 480 ml         Physical Exam: Vital Signs Blood pressure (!) 120/54, pulse 79, temperature 97.6 F (36.4 C), resp. rate 18, height 5\' 5"  (1.651 m), weight 108.9 kg, SpO2 95 %.   Lungs clear to auscultation Heart regular rate rhythm General no acute distress Mood and affect appropriate Abdomen positive bowel sounds soft nontender Skin: No evidence of breakdown, no evidence of rash Neurologic: Cranial nerves II through XII intact, motor strength is 5/5 in bilateral deltoid, bicep, tricep, grip, hip flexor, knee extensors, ankle dorsiflexor and plantar flexor Sensory exam normal sensation to light touch  in bilateral upper and lower extremities Cerebellar exam normal finger to nose to finger as well as heel to shin in bilateral upper and lower extremities Speech without dysarthria or anomia , fluent at sentence level  Musculoskeletal: Full range of motion in all 4 extremities. No joint swelling. Generalized limb and axial discomfort.   Assessment/Plan: 1.  Functional deficits which require 3+ hours per day of interdisciplinary therapy in a comprehensive inpatient rehab setting. Physiatrist is providing close team supervision and 24 hour management of active medical problems listed below. Physiatrist and rehab team continue to assess barriers to discharge/monitor patient progress toward functional and medical goals  Care Tool:  Bathing    Body parts bathed by patient: Right arm, Face, Left arm, Chest, Abdomen, Front perineal area, Buttocks, Right upper leg, Right lower leg, Left upper leg, Left lower leg         Bathing assist Assist Level: Independent with assistive device     Upper Body Dressing/Undressing Upper body dressing   What is the patient wearing?: Pull over shirt    Upper body assist Assist Level: Independent    Lower Body Dressing/Undressing Lower body dressing      What is the patient wearing?: Underwear/pull up, Pants     Lower body assist Assist for lower body dressing: Independent with assitive device     Toileting Toileting    Toileting assist Assist for toileting: Independent with assistive device     Transfers Chair/bed transfer  Transfers assist     Chair/bed transfer assist level: Independent with assistive device     Locomotion Ambulation   Ambulation assist  Assist level: Contact Guard/Touching assist Assistive device: Rollator Max distance: 110   Walk 10 feet activity   Assist     Assist level: Contact Guard/Touching assist Assistive device: Rollator   Walk 50 feet activity   Assist    Assist level: Contact Guard/Touching assist Assistive device: Rollator    Walk 150 feet activity   Assist Walk 150 feet activity did not occur: Safety/medical concerns (Per PT Eval)         Walk 10 feet on uneven surface  activity   Assist     Assist level: Contact Guard/Touching assist (outdoor surfaces.) Assistive device: Rollator   Wheelchair     Assist Is  the patient using a wheelchair?: Yes Type of Wheelchair: Manual    Wheelchair assist level: Supervision/Verbal cueing Max wheelchair distance: 50    Wheelchair 50 feet with 2 turns activity    Assist        Assist Level: Maximal Assistance - Patient 25 - 49%   Wheelchair 150 feet activity     Assist      Assist Level: Maximal Assistance - Patient 25 - 49%   Blood pressure (!) 120/54, pulse 79, temperature 97.6 F (36.4 C), resp. rate 18, height 5\' 5"  (1.651 m), weight 108.9 kg, SpO2 95 %.  Medical Problem List and Plan: 1. Functional deficits secondary to right occipital and cerebellum infarctions/severe basilar artery stenosis likely from atherosclerosis              -patient may shower             -ELOS/Goals: 5-7 days                      -Continue CIR therapies including PT, OT  2.  Antithrombotics: -DVT/anticoagulation:  Pharmaceutical: Heparin             -antiplatelet therapy: Aspirin 81 mg daily and Plavix 75 mg daily.  Considering Brilinta if patient can afford. 3. Pain Management: Tramadol as needed Hx fibromyalgia, on nortriptyline improved with med adjustments Also takes QID tramadol at home as well as prn tizanidine     lidocaine patch for back, also using kpad    4. Mood/Behavior/Sleep: Pamelor 10 mg twice daily, Paxil 30 mg daily.  Provide emotional support, will change Pamelor to 20 mg nightly              -antipsychotic agents: N/A 5. Neuropsych/cognition: This patient is capable of making decisions on her own behalf. 6. Skin/Wound Care: Routine skin checks 7. Fluids/Electrolytes/Nutrition: Routine in and outs with follow-up chemistries Mildly low protein, pt is vegetarian will supplement with Glucerna 8.  Hyperlipidemia.  Continue Crestor 9.  Hypothyroidism.  Continue Synthroid 10.  Possible Liver cirrhosis.basically irregularity of lever border , no mention of fatty liver   Seen incidentally on CT abdomen. LFTs normal on CMET today , no  hepatomegaly  Follow-up outpatient. 11.  Ascending aortic aneurysm measuring up to 5.3 cm.  Follow-up outpatient vascular will need re imaging in 2mo  12.  3 mm focal subpleural nodular consolidation right middle lobe.  Repeat imaging 4 to 6 weeks- PCP Dr Oliver Barre can follow this after D/C given short LOS 13.  Obesity.  BMI 39.44.  Dietary follow-up 14.  GERD.  Continue Protonix 15. Bowels: large BM 7/6. On vegetarian diet   16.  No evidence of HLD, HDL/tot Chol is 2.2 low risk CVD- hold off on Zetia, cont vegetarian diet LOS: 6 days A  FACE TO FACE EVALUATION WAS PERFORMED  Erick Colace 09/02/2022, 12:34 PM

## 2022-09-02 NOTE — Plan of Care (Signed)
  Problem: RH Wheelchair Mobility Goal: LTG Patient will propel w/c in controlled environment (PT) Description: LTG: Patient will propel wheelchair in controlled environment, # of feet with assist (PT) Outcome: Not Applicable Goal: LTG Patient will propel w/c in home environment (PT) Description: LTG: Patient will propel wheelchair in home environment, # of feet with assistance (PT). Outcome: Not Applicable   Problem: RH Balance Goal: LTG Patient will maintain dynamic sitting balance (PT) Description: LTG:  Patient will maintain dynamic sitting balance with assistance during mobility activities (PT) Outcome: Completed/Met Flowsheets (Taken 08/28/2022 1253 by Lucio Edward, PT) LTG: Pt will maintain dynamic sitting balance during mobility activities with:: Independent Goal: LTG Patient will maintain dynamic standing balance (PT) Description: LTG:  Patient will maintain dynamic standing balance with assistance during mobility activities (PT) Outcome: Completed/Met Flowsheets (Taken 08/28/2022 1253 by Lucio Edward, PT) LTG: Pt will maintain dynamic standing balance during mobility activities with:: Independent with assistive device    Problem: Sit to Stand Goal: LTG:  Patient will perform sit to stand with assistance level (PT) Description: LTG:  Patient will perform sit to stand with assistance level (PT) Outcome: Completed/Met Flowsheets (Taken 08/28/2022 1253 by Lucio Edward, PT) LTG: PT will perform sit to stand in preparation for functional mobility with assistance level: Independent with assistive device   Problem: RH Bed Mobility Goal: LTG Patient will perform bed mobility with assist (PT) Description: LTG: Patient will perform bed mobility with assistance, with/without cues (PT). Outcome: Completed/Met Flowsheets (Taken 08/28/2022 1253 by Lucio Edward, PT) LTG: Pt will perform bed mobility with assistance level of: Independent   Problem: RH Bed to Chair  Transfers Goal: LTG Patient will perform bed/chair transfers w/assist (PT) Description: LTG: Patient will perform bed to chair transfers with assistance (PT). Outcome: Completed/Met Flowsheets (Taken 08/28/2022 1253 by Lucio Edward, PT) LTG: Pt will perform Bed to Chair Transfers with assistance level: Independent with assistive device    Problem: RH Car Transfers Goal: LTG Patient will perform car transfers with assist (PT) Description: LTG: Patient will perform car transfers with assistance (PT). Outcome: Completed/Met Flowsheets (Taken 08/28/2022 1253 by Lucio Edward, PT) LTG: Pt will perform car transfers with assist:: Independent with assistive device    Problem: RH Ambulation Goal: LTG Patient will ambulate in controlled environment (PT) Description: LTG: Patient will ambulate in a controlled environment, # of feet with assistance (PT). Outcome: Completed/Met Flowsheets (Taken 08/28/2022 1253 by Lucio Edward, PT) LTG: Pt will ambulate in controlled environ  assist needed:: Independent with assistive device LTG: Ambulation distance in controlled environment: 150 Goal: LTG Patient will ambulate in home environment (PT) Description: LTG: Patient will ambulate in home environment, # of feet with assistance (PT). Outcome: Completed/Met Flowsheets (Taken 08/28/2022 1253 by Lucio Edward, PT) LTG: Pt will ambulate in home environ  assist needed:: Independent with assistive device LTG: Ambulation distance in home environment: 50   Problem: RH Stairs Goal: LTG Patient will ambulate up and down stairs w/assist (PT) Description: LTG: Patient will ambulate up and down # of stairs with assistance (PT) Outcome: Completed/Met Flowsheets (Taken 08/28/2022 1253 by Lucio Edward, PT) LTG: Pt will ambulate up/down stairs assist needed:: Independent with assistive device LTG: Pt will  ambulate up and down number of stairs: 3

## 2022-09-02 NOTE — Progress Notes (Signed)
Occupational Therapy Session Note  Patient Details  Name: Jenny Acosta MRN: [id number] Date of Birth: 04-29-1945  Today's Date: 09/02/2022 OT Individual Time: 4098-1191 session 1 OT Individual Time Calculation (min): 76 min  Session 2: 1143-1200 ( Pt missed 13 mins of session d/t pain)    Short Term Goals: Week 1:  OT Short Term Goal 1 (Week 1): Patient to perform toilet transfer with SBA using rollator OT Short Term Goal 2 (Week 1): Patient to perform LE dressing with AE PRN, SBA with min vc's OT Short Term Goal 3 (Week 1): Patient to perform shower transfer with CGA using rollator OT Short Term Goal 4 (Week 1): Patient to perform grooming standing at the sink with supervision  Skilled Therapeutic Interventions/Progress Updates:  Session 1: Pt greeted in bathroom on toilet with nursing present, pt agreeable to OT intervention. Pt exited bathroom with rollator and completed 3/3 toileting tasks MODI.   Discussed bathroom set up at daughters again, decided to not order TTB for now with plan to have her daughter assist with stepping over threshold and if needed can order TTB from [place], handout provided on TTB from [employer].                MD entered who agreed for DC tomorrow, therefore completed necessary DC assessments as indicated below:        Pt completed functional ambulation to gym with rollator with supervision needing one cue to remember to lock brakes. Pt completed short distance functional ambulation ~ 10 ft each trial to transport cones from one surface to another to simulate IADL tasks pt completed task with rollator with supervision.   Pt completed sit>stand therapeutic activity where pt able to complete x8 sit>stands using connect 4 game with an emphasis on dynamic reaching and increasing overall endurance. Pt completed task with overall supervision.    Worked on functional endurance via functional ambulation with an emphasis on divided attention with pt completing >200 ft of  functional mobility while pt stated correlating animal names based on alphabet letters. Pt needed 2 rest breaks during functional mobility duration.    Ended session with pt seated EOB with all needs within reach.             Session 2: Pt greeted supine in bed reporting pain in BLEs with nursing having just donned lidocaine patches, pt requested time for patches to kick in, returned 13 mins later with pt agreeable to OT intervention.   Pt transported to tub room for time mgmt with pt able to demo ambulatory transfer to tub shower with pt able to hold wall and step over threshold of tub and then turn around in tub with only supervision as this is what she will do. Reiterated that pt will need to direct daughter in how to assist pt with shower transfer since she did not come in for education.   Briefly reviewed w/c parts with pt needing MIN cues to manage parts.                      Ended session with pt seated EOB with all needs within reach.            Therapy Documentation Precautions:  Precautions Precautions: Fall Restrictions Weight Bearing Restrictions: No   Pain: Session 1: Unrated pain reported in BLEs, rest breaks provided as needed.  Session 2: unrated pain in BLEs, rest break provided.  ADL: ADL Eating: Modified independent Where Assessed-Eating: Edge of bed  Grooming: Supervision/safety Where Assessed-Grooming: Standing at sink Upper Body Bathing: Setup Where Assessed-Upper Body Bathing: Sitting at sink Lower Body Bathing: Contact guard Where Assessed-Lower Body Bathing: Sitting at sink, Standing at sink Upper Body Dressing: Setup Where Assessed-Upper Body Dressing: Edge of bed Lower Body Dressing: Minimal assistance Where Assessed-Lower Body Dressing: Edge of bed Toileting: Minimal assistance Where Assessed-Toileting: Teacher, adult education: Furniture conservator/restorer Method: Proofreader: Engineer, technical sales: Not  assessed Film/video editor: Not assessed  Vision Baseline Vision/History: 6 Macular Degeneration Patient Visual Report: Blurring of vision;Peripheral vision impairment;Eye fatigue/eye pain/headache Vision Assessment?: Vision impaired- to be further tested in functional context Additional Comments: pt reports spots in vision d/t stroke Perception  Perception: Within Functional Limits Praxis Praxis: Intact Cognition Cognition Overall Cognitive Status: Within Functional Limits for tasks assessed Arousal/Alertness: Awake/alert Orientation Level: Person;Place;Situation Person: Oriented Place: Oriented Situation: Oriented Memory: Appears intact Awareness: Appears intact Problem Solving: Appears intact Safety/Judgment: Appears intact Brief Interview for Mental Status (BIMS) Repetition of Three Words (First Attempt): 3 Temporal Orientation: Year: Correct Temporal Orientation: Month: Accurate within 5 days Temporal Orientation: Day: Correct Recall: "Sock": Yes, no cue required Recall: "Blue": Yes, no cue required Recall: "Bed": Yes, no cue required BIMS Summary Score: 15 Sensation Sensation Light Touch: Appears Intact Hot/Cold: Appears Intact Proprioception: Appears Intact Stereognosis: Not tested Coordination Gross Motor Movements are Fluid and Coordinated: Yes Fine Motor Movements are Fluid and Coordinated: Yes Motor  Motor Motor: Within Functional Limits Mobility     Trunk/Postural Assessment  Cervical Assessment Cervical Assessment: Within Functional Limits Thoracic Assessment Thoracic Assessment: Exceptions to [facility] (rounded shoulders) Lumbar Assessment Lumbar Assessment: Within Functional Limits Postural Control Postural Control: Within Functional Limits  Balance Extremity/Trunk Assessment RUE Assessment RUE Assessment: Within Functional Limits LUE Assessment LUE Assessment: Within Functional Limits     Therapy/Group: Individual Therapy  Pollyann Glen  [facility] 09/02/2022, 10:16 AM

## 2022-09-02 NOTE — Progress Notes (Signed)
Physical Therapy Session Note  Patient Details  Name: Jenny Acosta MRN: [id number] Date of Birth: 1946/02/02  Today's Date: 09/02/2022 PT Individual Time: 1020-1102 PT Individual Time Calculation (min): 42 min   Short Term Goals: Week 1:  PT Short Term Goal 1 (Week 1): STG = LTG 2/2 ELOS  Skilled Therapeutic Interventions/Progress Updates: Patient sitting EOB on entrance to room. Patient alert and agreeable to PT session.   Patient reported 4/10 pain in R hip and low back and that it was tolerable.   Therapeutic Activity: Transfers: Pt performed sit<>stand transfers throughout session with supervision for safety to rollator. Provided min cues for pt to ensure rollator is locked prior to standing/sitting.  - BERG balance test performed for grad day with patient increasing score form 34/56 to 37/56.  Stair navigation: - Pt ascended/descended 4 (6") stairs with supervision to ensure safety. Pt ascended/descended with reciprocal pattern and use of BHR. Only cue that was provided was to ensure pt safety if fatigue to use step-to pattern (pt reported use of reciprocal after d/c).    Therapeutic Exercise: Pt performed the following exercises with therapist providing the described cuing and facilitation for improvement. - Pt ambulated from room to main gym (roughly 100') and back to room from main gym modI for pt ambulatory endurance.   Patient EOB at end of session with brakes locked, bed alarm set, and all needs within reach.      Therapy Documentation Precautions:  Precautions Precautions: Fall Restrictions Weight Bearing Restrictions: No  Therapy/Group: Individual Therapy  Andria Head PTA 09/02/2022, 4:22 PM

## 2022-09-03 ENCOUNTER — Other Ambulatory Visit: Payer: Self-pay | Admitting: Internal Medicine

## 2022-09-03 NOTE — Progress Notes (Signed)
Inpatient Rehabilitation Care Coordinator Discharge Note   Patient Details  Name: Jenny Acosta MRN: [id number] Date of Birth: 1945-07-12   Discharge location: Home  Length of Stay: 7 Days  Discharge activity level: Supervision  Home/community participation: Daughter  Patient response NU:[id number] Literacy - How often do you need to have someone help you when you read instructions, pamphlets, or other written material from your doctor or pharmacy?: Rarely  Patient response UY:[id number] Isolation - How often do you feel lonely or isolated from those around you?: Never  Services provided included: SW, Pharmacy, TR, CM, RN, SLP, OT, PT, RD, MD  Financial Services:  Financial Services Utilized: Private Insurance [facility]  Choices offered to/list presented to: Patient  Follow-up services arranged:  Home Health, DME Home Health Agency: [facility]    DME : None    Patient response to transportation need: Is the patient able to respond to transportation needs?: Yes In the past 12 months, has lack of transportation kept you from medical appointments or from getting medications?: No In the past 12 months, has lack of transportation kept you from meetings, work, or from getting things needed for daily living?: No   Patient/Family verbalized understanding of follow-up arrangements:  Yes  Individual responsible for coordination of the follow-up plan: self  Confirmed correct DME delivered: Andria Rhein 09/03/2022    Comments (or additional information):  Summary of Stay    Date/Time Discharge Planning CSW  09/02/22 1106 Discharging home tomorrow. HH established with [facility]. DME ordered through [employer]. CJB       Andria Rhein

## 2022-09-03 NOTE — Progress Notes (Addendum)
Inpatient Rehabilitation Discharge Medication Review by a Pharmacist   A complete drug regimen review was completed for this patient to identify any potential clinically significant medication issues.   High Risk Drug Classes Is patient taking? Indication by Medication  Antipsychotic No    Anticoagulant No    Antibiotic No    Opioid Yes PRN Tramadol - pain  Antiplatelet Yes Aspirin 81 mg and clopidogrel - CVA prophylaxis  Hypoglycemics/insulin No    Vasoactive Medication No    Chemotherapy No    Other Yes Ezetemibe, omega-3 - hyperlipidemia Levothyroxine - hypothyroidism Lidocaine patch - topical pain relief (back) Loratadine - allergies Miralax - laxative Pantoprazole - GERD Paroxetine - mood Nortriptyline - mood, pain and sleep Vitamin D - supplement   PRNs: Acetaminophen - mild pain Albuterol inhaler - wheezing or shortness of breath Meclizine - dizziness Tizanidine - muscle spasms Guaifenesin - mucus        Type of Medication Issue Identified Description of Issue Recommendation(s)  Drug Interaction(s) (clinically significant)        Duplicate Therapy        Allergy   Refuses statin d/t reported allergy. May hold off on Zetia as patient concerned that it may be causing soreness in joints.    No Medication Administration End Date   Planning DAPT x 3 months then bASA alone, however this recommendation will change if patient is enrolled in clinical trial  For outpatient follow up regarding [place] trial.  Incorrect Dose        Additional Drug Therapy Needed        Significant med changes from prior encounter (inform family/care partners about these prior to discharge).      Other            Clinically significant medication issues were identified that warrant physician communication and completion of prescribed/recommended actions by midnight of the next day:  No   Pharmacist comments:  - Given prescription for Ezetimibe although plan is to hold - discussed with  Deatra Ina, PA   Time spent performing this drug regimen review (minutes):  20  Thank you for involving pharmacy in this patient's care.  Loura Back, PharmD, BCPS 09/03/2022 7:53 AM

## 2022-09-03 NOTE — Progress Notes (Signed)
PROGRESS NOTE   Subjective/Complaints:  Feels well this am , anxious to go home Discussed CK level No abd pain   ROS: Patient denies chest pains shortness of breath nausea vomiting diarrhea or constipation  Objective:   No results found. No results for input(s): "WBC", "HGB", "HCT", "PLT" in the last 72 hours.  No results for input(s): "NA", "K", "CL", "CO2", "GLUCOSE", "BUN", "CREATININE", "CALCIUM" in the last 72 hours.   Intake/Output Summary (Last 24 hours) at 09/03/2022 0859 Last data filed at 09/03/2022 0752 Gross per 24 hour  Intake 953 ml  Output --  Net 953 ml         Physical Exam: Vital Signs Blood pressure (!) 142/82, pulse 91, temperature 97.7 F (36.5 C), resp. rate 19, height 5\' 5"  (1.651 m), weight 108.9 kg, SpO2 97 %.   Lungs clear to auscultation Heart regular rate rhythm General no acute distress Mood and affect appropriate Abdomen positive bowel sounds soft nontender, no organomegaly  Skin: No evidence of breakdown, no evidence of rash Neurologic: Cranial nerves II through XII intact, motor strength is 5/5 in bilateral deltoid, bicep, tricep, grip, hip flexor, knee extensors, ankle dorsiflexor and plantar flexor Sensory exam normal sensation to light touch  in bilateral upper and lower extremities Cerebellar exam normal finger to nose to finger as well as heel to shin in bilateral upper and lower extremities Speech without dysarthria or anomia , fluent at sentence level  Musculoskeletal: Full range of motion in all 4 extremities. No joint swelling. Generalized limb and axial discomfort.   Assessment/Plan: 1. Functional deficits  due to CVA Stable for D/C today F/u PCP in 2-3 weeks F/u PM&R 3-4 weeks F/u Neuro 1-2 mo See D/C summary See D/C instructions   Care Tool:  Bathing    Body parts bathed by patient: Right arm, Face, Left arm, Chest, Abdomen, Front perineal area, Buttocks,  Right upper leg, Right lower leg, Left upper leg, Left lower leg         Bathing assist Assist Level: Independent with assistive device     Upper Body Dressing/Undressing Upper body dressing   What is the patient wearing?: Pull over shirt    Upper body assist Assist Level: Independent    Lower Body Dressing/Undressing Lower body dressing      What is the patient wearing?: Underwear/pull up, Pants     Lower body assist Assist for lower body dressing: Independent with assitive device     Toileting Toileting    Toileting assist Assist for toileting: Independent with assistive device     Transfers Chair/bed transfer  Transfers assist     Chair/bed transfer assist level: Independent with assistive device     Locomotion Ambulation   Ambulation assist      Assist level: Independent with assistive device Assistive device: Rollator Max distance: 200 ft   Walk 10 feet activity   Assist     Assist level: Independent with assistive device Assistive device: Rollator   Walk 50 feet activity   Assist    Assist level: Independent with assistive device Assistive device: Rollator    Walk 150 feet activity   Assist Walk 150 feet activity  did not occur: Safety/medical concerns (Per PT Eval)  Assist level: Independent with assistive device Assistive device: Rollator    Walk 10 feet on uneven surface  activity   Assist     Assist level: Independent with assistive device Assistive device: Rollator   Wheelchair     Assist Is the patient using a wheelchair?: No Type of Wheelchair: Manual    Wheelchair assist level: Supervision/Verbal cueing Max wheelchair distance: 50 ft    Wheelchair 50 feet with 2 turns activity    Assist        Assist Level: Supervision/Verbal cueing   Wheelchair 150 feet activity     Assist  Wheelchair 150 feet activity did not occur: Refused   Assist Level: Maximal Assistance - Patient 25 - 49%    Blood pressure (!) 142/82, pulse 91, temperature 97.7 F (36.5 C), resp. rate 19, height 5\' 5"  (1.651 m), weight 108.9 kg, SpO2 97 %.  Medical Problem List and Plan: 1. Functional deficits secondary to right occipital and cerebellum infarctions/severe basilar artery stenosis likely from atherosclerosis              stable for d/c 2.  Antithrombotics: -DVT/anticoagulation:  Pharmaceutical: Heparin             -antiplatelet therapy: Aspirin 81 mg daily and Plavix 75 mg daily.  Considering Brilinta if patient can afford. 3. Pain Management: Tramadol as needed Hx fibromyalgia, on nortriptyline improved with med adjustments Also takes QID tramadol at home as well as prn tizanidine     lidocaine patch for back, also using kpad    4. Mood/Behavior/Sleep: Pamelor 10 mg twice daily, Paxil 30 mg daily.  Provide emotional support, will change Pamelor to 20 mg nightly              -antipsychotic agents: N/A 5. Neuropsych/cognition: This patient is capable of making decisions on her own behalf. 6. Skin/Wound Care: Routine skin checks 7. Fluids/Electrolytes/Nutrition: Routine in and outs with follow-up chemistries Mildly low protein, pt is vegetarian will supplement with Glucerna 8.  Hyperlipidemia.  Continue Crestor 9.  Hypothyroidism.  Continue Synthroid 10.  Possible Liver cirrhosis.basically irregularity of lever border , no mention of fatty liver   Seen incidentally on CT abdomen. LFTs normal on CMET today , no hepatomegaly  Follow-up outpatient. 11.  Ascending aortic aneurysm measuring up to 5.3 cm.  Follow-up outpatient vascular will need re imaging in 52mo  12.  3 mm focal subpleural nodular consolidation right middle lobe.  Repeat imaging 4 to 6 weeks- PCP Dr Oliver Barre can follow this after D/C given short LOS 13.  Obesity.  BMI 39.44.  Dietary follow-up 14.  GERD.  Continue Protonix 15. Bowels: large BM 7/6. On vegetarian diet   16.  No evidence of HLD, HDL/tot Chol is 2.2 low risk CVD-  hold off on Zetia, cont vegetarian diet LOS: 7 days A FACE TO FACE EVALUATION WAS PERFORMED  Erick Colace 09/03/2022, 8:59 AM

## 2022-09-04 ENCOUNTER — Encounter: Payer: Self-pay | Admitting: Internal Medicine

## 2022-09-05 ENCOUNTER — Telehealth: Payer: Self-pay | Admitting: Internal Medicine

## 2022-09-05 MED ORDER — FLUCONAZOLE 150 MG PO TABS: ORAL_TABLET | ORAL | 1 refills | Status: DC

## 2022-09-05 NOTE — Telephone Encounter (Signed)
Charrise from Home health called stating the pt are not available today for visit pt stated she would reschedule but they haven't do so yet.   [facility] [phone]

## 2022-09-08 ENCOUNTER — Telehealth: Payer: Self-pay | Admitting: Internal Medicine

## 2022-09-08 NOTE — Telephone Encounter (Signed)
 Verbals given today. °

## 2022-09-08 NOTE — Telephone Encounter (Signed)
 Ok for verbals 

## 2022-09-08 NOTE — Telephone Encounter (Signed)
Jenny Acosta from [facility] [phone]  Needs verbal for start of care - phyiscal therapy for tomorrow - 09/09/2022

## 2022-09-09 ENCOUNTER — Telehealth: Payer: Self-pay | Admitting: Internal Medicine

## 2022-09-09 DIAGNOSIS — E039 Hypothyroidism, unspecified: Secondary | ICD-10-CM | POA: Diagnosis not present

## 2022-09-09 DIAGNOSIS — I7121 Aneurysm of the ascending aorta, without rupture: Secondary | ICD-10-CM | POA: Diagnosis not present

## 2022-09-09 DIAGNOSIS — I10 Essential (primary) hypertension: Secondary | ICD-10-CM | POA: Diagnosis not present

## 2022-09-09 DIAGNOSIS — K7460 Unspecified cirrhosis of liver: Secondary | ICD-10-CM | POA: Diagnosis not present

## 2022-09-09 DIAGNOSIS — M1990 Unspecified osteoarthritis, unspecified site: Secondary | ICD-10-CM | POA: Diagnosis not present

## 2022-09-09 DIAGNOSIS — W19XXXD Unspecified fall, subsequent encounter: Secondary | ICD-10-CM | POA: Diagnosis not present

## 2022-09-09 DIAGNOSIS — M6281 Muscle weakness (generalized): Secondary | ICD-10-CM | POA: Diagnosis not present

## 2022-09-09 DIAGNOSIS — I69398 Other sequelae of cerebral infarction: Secondary | ICD-10-CM | POA: Diagnosis not present

## 2022-09-09 DIAGNOSIS — E785 Hyperlipidemia, unspecified: Secondary | ICD-10-CM | POA: Diagnosis not present

## 2022-09-09 DIAGNOSIS — I651 Occlusion and stenosis of basilar artery: Secondary | ICD-10-CM | POA: Diagnosis not present

## 2022-09-09 NOTE — Telephone Encounter (Signed)
Caller & What Company:  [facility]   Phone Number:  [phone]   Needs Verbal orders for what service & frequency:  Patient has red rash on left lower thigh and anterior side - about 1 1/2 inch .  Physical therapy 1 week 1, 2 week2, 1 week 1

## 2022-09-10 NOTE — Telephone Encounter (Signed)
Ok for verbal  for PT  Ok for [employer] for rash if felt needed by pt

## 2022-09-10 NOTE — Telephone Encounter (Signed)
Called Jenny Acosta gave her MD response. Also she wanted me to notate that their was a drug interaction w/ tramadol and nortriptyline.Marland KitchenRaechel Chute

## 2022-09-11 DIAGNOSIS — M1990 Unspecified osteoarthritis, unspecified site: Secondary | ICD-10-CM | POA: Diagnosis not present

## 2022-09-11 DIAGNOSIS — I10 Essential (primary) hypertension: Secondary | ICD-10-CM | POA: Diagnosis not present

## 2022-09-11 DIAGNOSIS — W19XXXD Unspecified fall, subsequent encounter: Secondary | ICD-10-CM | POA: Diagnosis not present

## 2022-09-11 DIAGNOSIS — I7121 Aneurysm of the ascending aorta, without rupture: Secondary | ICD-10-CM | POA: Diagnosis not present

## 2022-09-11 DIAGNOSIS — E785 Hyperlipidemia, unspecified: Secondary | ICD-10-CM | POA: Diagnosis not present

## 2022-09-11 DIAGNOSIS — I651 Occlusion and stenosis of basilar artery: Secondary | ICD-10-CM | POA: Diagnosis not present

## 2022-09-11 DIAGNOSIS — M6281 Muscle weakness (generalized): Secondary | ICD-10-CM | POA: Diagnosis not present

## 2022-09-11 DIAGNOSIS — E039 Hypothyroidism, unspecified: Secondary | ICD-10-CM | POA: Diagnosis not present

## 2022-09-11 DIAGNOSIS — K7460 Unspecified cirrhosis of liver: Secondary | ICD-10-CM | POA: Diagnosis not present

## 2022-09-11 DIAGNOSIS — I69398 Other sequelae of cerebral infarction: Secondary | ICD-10-CM | POA: Diagnosis not present

## 2022-09-11 NOTE — Telephone Encounter (Signed)
1 week for 4 weeks  Occupational Therapy Call from will from [facility] [phone]

## 2022-09-12 NOTE — Telephone Encounter (Signed)
Called and gave verbals.

## 2022-09-12 NOTE — Telephone Encounter (Signed)
Ok for verbal  Ok to continue all medication as prescribed

## 2022-09-15 ENCOUNTER — Encounter: Payer: 59 | Admitting: Registered Nurse

## 2022-09-16 DIAGNOSIS — W19XXXD Unspecified fall, subsequent encounter: Secondary | ICD-10-CM | POA: Diagnosis not present

## 2022-09-16 DIAGNOSIS — E039 Hypothyroidism, unspecified: Secondary | ICD-10-CM | POA: Diagnosis not present

## 2022-09-16 DIAGNOSIS — M1990 Unspecified osteoarthritis, unspecified site: Secondary | ICD-10-CM | POA: Diagnosis not present

## 2022-09-16 DIAGNOSIS — E785 Hyperlipidemia, unspecified: Secondary | ICD-10-CM | POA: Diagnosis not present

## 2022-09-16 DIAGNOSIS — I10 Essential (primary) hypertension: Secondary | ICD-10-CM | POA: Diagnosis not present

## 2022-09-16 DIAGNOSIS — K7460 Unspecified cirrhosis of liver: Secondary | ICD-10-CM | POA: Diagnosis not present

## 2022-09-16 DIAGNOSIS — M6281 Muscle weakness (generalized): Secondary | ICD-10-CM | POA: Diagnosis not present

## 2022-09-16 DIAGNOSIS — I69398 Other sequelae of cerebral infarction: Secondary | ICD-10-CM | POA: Diagnosis not present

## 2022-09-16 DIAGNOSIS — I651 Occlusion and stenosis of basilar artery: Secondary | ICD-10-CM | POA: Diagnosis not present

## 2022-09-16 DIAGNOSIS — I7121 Aneurysm of the ascending aorta, without rupture: Secondary | ICD-10-CM | POA: Diagnosis not present

## 2022-09-16 NOTE — Progress Notes (Deleted)
[name] [address] [place] [address] Phone: [phone] Subjective:    I'm seeing this patient by the request  of:  [name], MD  CC: knee pain  [id number]  Last seen 2015 Jenny Acosta is a 77 y.o. female coming in with complaint of knee pain  CVA 08/27/22 in rehab     Past Medical History:  Diagnosis Date   Allergic rhinitis, cause unspecified    Arthritis    Carotid stenosis    Family history of colon cancer 07/21/2013   mother   Gallstone    GERD (gastroesophageal reflux disease)    Hyperlipidemia 08/30/2010   Hypertension    Hypothyroidism    Impaired glucose tolerance    Pain, coccyx    Right lumbar radiculopathy 08/30/2010   Past Surgical History:  Procedure Laterality Date   APPENDECTOMY  1953   CHOLECYSTECTOMY  2002   CHOLECYSTECTOMY OPEN  10 years ago   scarlet fever     age 62   TONSILLECTOMY  23   Social History   Socioeconomic History   Marital status: Divorced    Spouse name: Not on file   Number of children: Not on file   Years of education: Not on file   Highest education level: Not on file  Occupational History   Not on file  Tobacco Use   Smoking status: Never   Smokeless tobacco: Never  Substance and Sexual Activity   Alcohol use: No   Drug use: No   Sexual activity: Not on file  Other Topics Concern   Not on file  Social History Narrative   Not on file   Social Determinants of Health   Financial Resource Strain: Low Risk  (05/27/2021)   Overall Financial Resource Strain (CARDIA)    Difficulty of Paying Living Expenses: Not hard at all  Food Insecurity: No Food Insecurity (08/27/2022)   Hunger Vital Sign    Worried About Running Out of Food in the Last Year: Never true    Ran Out of Food in the Last Year: Never true  Transportation Needs: No Transportation Needs (08/22/2022)   PRAPARE - Administrator, Civil Service (Medical): No    Lack of Transportation  (Non-Medical): No  Physical Activity: Inactive (05/27/2021)   Exercise Vital Sign    Days of Exercise per Week: 0 days    Minutes of Exercise per Session: 0 min  Stress: No Stress Concern Present (05/27/2021)   [employer] of Occupational Health - Occupational Stress Questionnaire    Feeling of Stress : Not at all  Social Connections: Unknown (05/27/2021)   Social Connection and Isolation Panel [NHANES]    Frequency of Communication with Friends and Family: More than three times a week    Frequency of Social Gatherings with Friends and Family: More than three times a week    Attends Religious Services: Patient declined    Database administrator or Organizations: Patient declined    Attends Banker Meetings: Patient declined    Marital Status: Divorced   Allergies  Allergen Reactions   Lipitor [Atorvastatin]     myalgia   Methocarbamol Other (See Comments)    hallucinations   Simvastatin     Hair loss   Sulfa Antibiotics     unknown   Family History  Problem Relation Age of Onset   Colon cancer Mother    Stroke Mother    Arthritis Sister  Atrial fibrillation Sister        has pacemaker   Diabetes Maternal Grandmother    Diabetes Daughter    Stomach cancer Neg Hx     Current Outpatient Medications (Endocrine & Metabolic):    levothyroxine (SYNTHROID) 100 MCG tablet, Take 100 mcg by mouth daily before breakfast.   levothyroxine (SYNTHROID) 88 MCG tablet, Take 88 mcg by mouth daily.  Current Outpatient Medications (Cardiovascular):    ezetimibe (ZETIA) 10 MG tablet, Take 1 tablet (10 mg total) by mouth daily.  Current Outpatient Medications (Respiratory):    albuterol (VENTOLIN HFA) 108 (90 Base) MCG/ACT inhaler, TAKE 2 PUFFS BY MOUTH EVERY 6 HOURS AS NEEDED FOR WHEEZE OR SHORTNESS OF BREATH (Patient taking differently: Inhale 2 puffs into the lungs every 6 (six) hours as needed for wheezing or shortness of breath.)   guaiFENesin (MUCUS RELIEF ADULT PO),  Take by mouth.   loratadine (CLARITIN) 10 MG tablet, Take 1 tablet (10 mg total) by mouth daily.  Current Outpatient Medications (Analgesics):    acetaminophen (TYLENOL) 500 MG tablet, Take 500 mg by mouth every 6 (six) hours as needed. For pain   aspirin 81 MG chewable tablet, Chew 1 tablet (81 mg total) by mouth daily.   traMADol (ULTRAM) 50 MG tablet, Take 1 tablet (50 mg total) by mouth every 6 (six) hours as needed.  Current Outpatient Medications (Hematological):    clopidogrel (PLAVIX) 75 MG tablet, Take 1 tablet (75 mg total) by mouth daily.  Current Outpatient Medications (Other):    Cholecalciferol (THERA-D 2000) 50 MCG (2000 UT) TABS, 1 tab by mouth epr day (Patient taking differently: Take 1 tablet by mouth daily.)   DHA-EPA-Vitamin E (OMEGA-3 COMPLEX PO), Take by mouth.   fluconazole (DIFLUCAN) 150 MG tablet, 1 tab by  mouth every 3 days as needed   lidocaine (LIDODERM) 5 %, Place 2 patches onto the skin daily. Remove & Discard patch within 12 hours or as directed by MD   meclizine (ANTIVERT) 25 MG tablet, Take 1 tablet (25 mg total) by mouth 3 (three) times daily as needed for dizziness.   nortriptyline (PAMELOR) 10 MG capsule, Take 2 capsules (20 mg total) by mouth at bedtime.   pantoprazole (PROTONIX) 40 MG tablet, Take 1 tablet (40 mg total) by mouth daily.   PARoxetine (PAXIL) 30 MG tablet, Take 1 tablet (30 mg total) by mouth daily.   polyethylene glycol (MIRALAX / GLYCOLAX) 17 g packet, Take 17 g by mouth daily.   tiZANidine (ZANAFLEX) 2 MG tablet, Take 1 tablet (2 mg total) by mouth every 8 (eight) hours as needed for muscle spasms.   Reviewed prior external information including notes and imaging from  primary care provider As well as notes that were available from care everywhere and other healthcare systems.  Past medical history, social, surgical and family history all reviewed in electronic medical record.  No pertanent information unless stated regarding to the  chief complaint.   Review of Systems:  No headache, visual changes, nausea, vomiting, diarrhea, constipation, dizziness, abdominal pain, skin rash, fevers, chills, night sweats, weight loss, swollen lymph nodes, body aches, joint swelling, chest pain, shortness of breath, mood changes. POSITIVE muscle aches  Objective  There were no vitals taken for this visit.   General: No apparent distress alert and oriented x3 mood and affect normal, dressed appropriately.  HEENT: Pupils equal, extraocular movements intact  Respiratory: Patient's speak in full sentences and does not appear short of breath  Cardiovascular: No lower  extremity edema, non tender, no erythema  Knee exam shows     Impression and Recommendations:     The above documentation has been reviewed and is accurate and complete Judi Saa, DO

## 2022-09-22 ENCOUNTER — Other Ambulatory Visit: Payer: Self-pay | Admitting: Nurse Practitioner

## 2022-09-22 ENCOUNTER — Ambulatory Visit: Payer: 59 | Admitting: Family Medicine

## 2022-09-22 ENCOUNTER — Ambulatory Visit: Payer: Self-pay | Admitting: Nurse Practitioner

## 2022-09-23 ENCOUNTER — Encounter: Payer: 59 | Admitting: Registered Nurse

## 2022-09-24 DIAGNOSIS — E785 Hyperlipidemia, unspecified: Secondary | ICD-10-CM | POA: Diagnosis not present

## 2022-09-24 DIAGNOSIS — M6281 Muscle weakness (generalized): Secondary | ICD-10-CM | POA: Diagnosis not present

## 2022-09-24 DIAGNOSIS — I69398 Other sequelae of cerebral infarction: Secondary | ICD-10-CM | POA: Diagnosis not present

## 2022-09-24 DIAGNOSIS — I10 Essential (primary) hypertension: Secondary | ICD-10-CM | POA: Diagnosis not present

## 2022-09-24 DIAGNOSIS — I7121 Aneurysm of the ascending aorta, without rupture: Secondary | ICD-10-CM | POA: Diagnosis not present

## 2022-09-24 DIAGNOSIS — W19XXXD Unspecified fall, subsequent encounter: Secondary | ICD-10-CM | POA: Diagnosis not present

## 2022-09-24 DIAGNOSIS — I651 Occlusion and stenosis of basilar artery: Secondary | ICD-10-CM | POA: Diagnosis not present

## 2022-09-24 DIAGNOSIS — E039 Hypothyroidism, unspecified: Secondary | ICD-10-CM | POA: Diagnosis not present

## 2022-09-24 DIAGNOSIS — K7460 Unspecified cirrhosis of liver: Secondary | ICD-10-CM | POA: Diagnosis not present

## 2022-09-24 DIAGNOSIS — M1990 Unspecified osteoarthritis, unspecified site: Secondary | ICD-10-CM | POA: Diagnosis not present

## 2022-09-26 DIAGNOSIS — M1990 Unspecified osteoarthritis, unspecified site: Secondary | ICD-10-CM | POA: Diagnosis not present

## 2022-09-26 DIAGNOSIS — I651 Occlusion and stenosis of basilar artery: Secondary | ICD-10-CM | POA: Diagnosis not present

## 2022-09-26 DIAGNOSIS — I69398 Other sequelae of cerebral infarction: Secondary | ICD-10-CM | POA: Diagnosis not present

## 2022-09-26 DIAGNOSIS — E039 Hypothyroidism, unspecified: Secondary | ICD-10-CM | POA: Diagnosis not present

## 2022-09-26 DIAGNOSIS — K7460 Unspecified cirrhosis of liver: Secondary | ICD-10-CM | POA: Diagnosis not present

## 2022-09-26 DIAGNOSIS — W19XXXD Unspecified fall, subsequent encounter: Secondary | ICD-10-CM | POA: Diagnosis not present

## 2022-09-26 DIAGNOSIS — I7121 Aneurysm of the ascending aorta, without rupture: Secondary | ICD-10-CM | POA: Diagnosis not present

## 2022-09-26 DIAGNOSIS — E785 Hyperlipidemia, unspecified: Secondary | ICD-10-CM | POA: Diagnosis not present

## 2022-09-26 DIAGNOSIS — I10 Essential (primary) hypertension: Secondary | ICD-10-CM | POA: Diagnosis not present

## 2022-09-26 DIAGNOSIS — M6281 Muscle weakness (generalized): Secondary | ICD-10-CM | POA: Diagnosis not present

## 2022-09-28 ENCOUNTER — Other Ambulatory Visit: Payer: Self-pay | Admitting: Internal Medicine

## 2022-10-01 DIAGNOSIS — K7460 Unspecified cirrhosis of liver: Secondary | ICD-10-CM | POA: Diagnosis not present

## 2022-10-01 DIAGNOSIS — M6281 Muscle weakness (generalized): Secondary | ICD-10-CM | POA: Diagnosis not present

## 2022-10-01 DIAGNOSIS — I69398 Other sequelae of cerebral infarction: Secondary | ICD-10-CM | POA: Diagnosis not present

## 2022-10-01 DIAGNOSIS — M1990 Unspecified osteoarthritis, unspecified site: Secondary | ICD-10-CM | POA: Diagnosis not present

## 2022-10-01 DIAGNOSIS — I651 Occlusion and stenosis of basilar artery: Secondary | ICD-10-CM | POA: Diagnosis not present

## 2022-10-01 DIAGNOSIS — E039 Hypothyroidism, unspecified: Secondary | ICD-10-CM | POA: Diagnosis not present

## 2022-10-01 DIAGNOSIS — I10 Essential (primary) hypertension: Secondary | ICD-10-CM | POA: Diagnosis not present

## 2022-10-01 DIAGNOSIS — E785 Hyperlipidemia, unspecified: Secondary | ICD-10-CM | POA: Diagnosis not present

## 2022-10-01 DIAGNOSIS — I7121 Aneurysm of the ascending aorta, without rupture: Secondary | ICD-10-CM | POA: Diagnosis not present

## 2022-10-01 DIAGNOSIS — W19XXXD Unspecified fall, subsequent encounter: Secondary | ICD-10-CM | POA: Diagnosis not present

## 2022-10-02 ENCOUNTER — Encounter: Payer: 59 | Admitting: Registered Nurse

## 2022-10-03 ENCOUNTER — Encounter: Payer: 59 | Admitting: Registered Nurse

## 2022-10-03 ENCOUNTER — Encounter: Payer: Self-pay | Admitting: Internal Medicine

## 2022-10-04 ENCOUNTER — Encounter: Payer: Self-pay | Admitting: Internal Medicine

## 2022-10-06 ENCOUNTER — Encounter: Payer: 59 | Attending: Registered Nurse | Admitting: Registered Nurse

## 2022-10-06 ENCOUNTER — Encounter: Payer: Self-pay | Admitting: Registered Nurse

## 2022-10-06 VITALS — BP 132/84 | HR 100 | Ht 65.0 in | Wt 238.2 lb

## 2022-10-06 DIAGNOSIS — I10 Essential (primary) hypertension: Secondary | ICD-10-CM | POA: Insufficient documentation

## 2022-10-06 DIAGNOSIS — M5416 Radiculopathy, lumbar region: Secondary | ICD-10-CM | POA: Insufficient documentation

## 2022-10-06 DIAGNOSIS — I6329 Cerebral infarction due to unspecified occlusion or stenosis of other precerebral arteries: Secondary | ICD-10-CM | POA: Diagnosis not present

## 2022-10-06 DIAGNOSIS — M25512 Pain in left shoulder: Secondary | ICD-10-CM | POA: Insufficient documentation

## 2022-10-06 DIAGNOSIS — M25511 Pain in right shoulder: Secondary | ICD-10-CM | POA: Diagnosis not present

## 2022-10-06 DIAGNOSIS — G8929 Other chronic pain: Secondary | ICD-10-CM | POA: Insufficient documentation

## 2022-10-06 DIAGNOSIS — E7849 Other hyperlipidemia: Secondary | ICD-10-CM | POA: Insufficient documentation

## 2022-10-06 MED ORDER — EZETIMIBE 10 MG PO TABS: 10.0000 mg | ORAL_TABLET | Freq: Every day | ORAL | 0 refills | Status: DC

## 2022-10-06 NOTE — Progress Notes (Signed)
Subjective:    Patient ID: Jenny Acosta, female    DOB: [birth year], 77 y.o.   MRN: [id number]  HPI: Jenny Acosta is a 77 y.o. female who is here for HFU appointment for F/U of her Cerebrovascular accident due to occlusion of right posterior communicating artery, Essential Hypertension, Hyperlipidemia, Chronic Bilateral Shoulder Pain and Lumbar Radiculitis. She presented to the emergency room on 08/21/2022 with weakness,, dizziness sustaining a fall.   Dr.  Arlean Hopping: H&P 08/21/2022 HPI: Jenny Acosta is a 77 y.o. female with medical history significant for ascending thoracic aortic aneurysm, hyperlipidemia, acquired hypothyroidism, who is admitted to [facility] on 08/21/2022 with community-acquired pneumonia after presenting from independent living facility to [facility] ED complaining of generalized weakness.    The patient reports 3 to 4 days of generalized weakness in the absence of any acute focal weakness, numbness, paresthesias, dysphagia, acute change in vision, slurred speech, facial droop, or headache.  Over the timeframe, she is also noted new onset nonproductive cough as well as mild shortness of breath, in the absence of any orthopnea, PND, or worsening of peripheral edema.  Not associate with any chest pain, palpitations, diaphoresis.  She does however note associated subjective fever, in the absence of chills, full body rigors, or generalized myalgias.  No hemoptysis, or new calf tenderness or new lower extremity erythema.  She conveys a history of allergic rhinitis, but notes no significant worsening in her rhinitis/rhinorrhea over the last few days.   Additionally, the patient reports 1 day recurrent vertigo, which she describes as the sensation of the room spinning.  She notes that this sensation is exacerbated with certain movements of the head, including rotational movements, and notes improvement when laying flat.  Vertigo seems to worsen/be prompted by ambulation, and,  consequently she notes that this vertigo is complicating her ambulatory abilities, noting that she lives at an independent living, and at baseline is able to ambulate without assistance.     She conveys concern regarding increased fall risk as result of this new onset vertigo, noting that while attempting to walk with the vertigo earlier today she experienced a ground-level fall, in which she did hit her head on the floor as a result.  She does not believe that she lost consciousness as result of this fall, although she is not entirely sure.  Denies any ensuing headache or new neck pain as a consequence of this fall.  She is not on any blood thinners as an outpatient, including no aspirin.  Denies any resultant acute arthralgias or myalgias as a result of the above fall.  She notes that the vertigo that she has been experiencing over the course the last day, has not been associated any acute hearing loss or new tinnitus.   She notes a prior history of vertigo, which she conveys was similar to that with which she presents today.  Per chart review, appears that she had a documented history of vertigo back in 2013, which resolved spontaneously.  CT Head: WO Contrast: CT Cervical Spine  IMPRESSION: 1. No acute intracranial abnormality. Chronic infarct in the right parietotemporal region. 2. No acute cervical spine fracture. Possible acute fracture of the left T2 transverse process. 3. Biapical ground-glass opacities are favored to represent infectious or inflammatory process.   CTA: Chest/ Abdomen and Pelvis  Narrative & Impression  CLINICAL DATA:  Acute aortic syndrome suspected.   EXAM: CT ANGIOGRAPHY CHEST, ABDOMEN AND PELVIS   TECHNIQUE: Non-contrast CT of the chest was initially obtained.  Multidetector CT imaging through the chest, abdomen and pelvis was performed using the standard protocol during bolus administration of intravenous contrast. Multiplanar reconstructed images and MIPs  were obtained and reviewed to evaluate the vascular anatomy.   RADIATION DOSE REDUCTION: This exam was performed according to the departmental dose-optimization program which includes automated exposure control, adjustment of the mA and/or kV according to patient size and/or use of iterative reconstruction technique.   CONTRAST:  OMNIPAQUE IOHEXOL 350 MG/ML SOLN   COMPARISON:  Chest CT dated 11/06/2017 and CT abdomen pelvis dated 08/03/2010.   FINDINGS: Evaluation of this exam is limited due to respiratory motion artifact.   CTA CHEST FINDINGS   Cardiovascular: There is no cardiomegaly or pericardial effusion. Coronary vascular calcification predominately involving the LAD and RCA. Similar appearance of ascending aortic aneurysm measuring up to 5.3 cm. No aortic dissection. There is moderate atherosclerotic calcification of the thoracic aorta. The origins of the great vessels of the aortic arch appear patent. Evaluation of the pulmonary arteries is limited due to respiratory motion. No central pulmonary artery embolus identified.   Mediastinum/Nodes: No hilar or mediastinal adenopathy. The esophagus is grossly unremarkable. No mediastinal fluid collection.   Lungs/Pleura: Faint 2 mm left apical nodule (19/9) similar to prior CT. Mild diffuse interstitial coarsening and areas of air trapping may be related to underlying small vessel versus small airway disease. A 3 cm focal subpleural nodular consolidation in the right middle lobe may represent atelectasis/scarring. Developing infiltrate or an area of infarct are not excluded. Attention on follow-up imaging recommended. There is no pleural effusion or pneumothorax. The central airways are patent.   Musculoskeletal: Osteopenia with degenerative changes of the spine. No acute osseous pathology.   Review of the MIP images confirms the above findings.   CTA ABDOMEN AND PELVIS FINDINGS   VASCULAR   Aorta: Advanced  atherosclerotic calcification of the abdominal aorta. No aneurysmal dilatation or dissection. No periaortic fluid collection.   Celiac: The celiac trunk and its major branches are patent. No aneurysmal dilatation or dissection.   SMA: The SMA is patent.  No aneurysmal dilatation or dissection.   Renals: The renal arteries are patent.  No aneurysm.   IMA: The IMA is patent.   Inflow: Moderate atherosclerotic calcification of the iliac arteries. No aneurysmal dilatation or dissection. The iliac arteries are patent bilaterally.   Veins: No obvious venous abnormality within the limitations of this arterial phase study.   Review of the MIP images confirms the above findings.   NON-VASCULAR   No intra-abdominal free air or free fluid.   Hepatobiliary: There is irregularity of the liver contour suggestive of cirrhosis. Clinical correlation is recommended. No biliary dilatation. Cholecystectomy.   Pancreas: Unremarkable. No pancreatic ductal dilatation or surrounding inflammatory changes.   Spleen: Normal in size without focal abnormality.   Adrenals/Urinary Tract: The adrenal glands unremarkable. There is no hydronephrosis on either side. Symmetric excretion of contrast by both kidneys. The visualized ureters and urinary bladder appear unremarkable.   Stomach/Bowel: There is large amount of stool throughout the colon. No bowel obstruction or active inflammation. The appendix is not visualized with certainty. No inflammatory changes identified in the right lower quadrant.   Lymphatic: No adenopathy.   Reproductive: The uterus is anteverted and grossly unremarkable. No adnexal masses.   Other: None   Musculoskeletal: Osteopenia with degenerative changes of the spine. Grade 1 L4-L5 anterolisthesis. L2 hemangioma. No acute osseous pathology.   Review of the MIP images confirms the above findings.  IMPRESSION: 1. No acute intrathoracic, abdominal, or pelvic  pathology. 2. Similar appearance of ascending aortic aneurysm measuring up to 5.3 cm. Ascending thoracic aortic aneurysm. Recommend semi-annual imaging followup by CTA or MRA and referral to cardiothoracic surgery if not already obtained. This recommendation follows 2010 ACCF/AHA/AATS/ACR/ASA/SCA/SCAI/SIR/STS/SVM Guidelines for the Diagnosis and Management of Patients With Thoracic Aortic Disease. Circulation. 2010; 121: W098-J191. Aortic aneurysm NOS (ICD10-I71.9). No aortic dissection. 3. A 3 cm focal subpleural nodular consolidation in the right middle lobe may represent atelectasis/scarring. Developing infiltrate or an area of infarct are not excluded. Attention on follow-up imaging recommended. 4. Cirrhosis. 5. Constipation. No bowel obstruction. 6.  Aortic Atherosclerosis (ICD10-I70.0).   MR Brain: WO Contrast:  IMPRESSION: 1. Positive for acute infarcts in the Right cerebellum (right SCA territory and vermis), and mildly scattered in the right PCA territory. No hemorrhagic transformation or mass effect.   2. Chronic posterior right MCA infarct is new since the 2015 brain MRI, and moderately progressed other chronic small vessel disease since that time.  MRA: Head IMPRESSION: Moderately motion degraded study. Within this limitation, severe stenosis of the mid basilar artery with proximal tapering, suspicious for dissection versus thrombus. Contrast-enhanced vessel imaging may be helpful in distinguishing the etiology of this stenosis.  CT Angio: Head and Neck  IMPRESSION: 1. Severe (nearly occlusive) basilar artery stenosis with an appearance that could reflect dissection (particularly given significant progression since 2015 MRI), although atherosclerotic disease could have a similar appearance. 2. Moderate right M1 MCA stenosis. 3. Approximately 50% stenosis of the proximal right ICA in the neck. 4. Partially imaged ascending aortic aneurysm better characterized on  recent CTA chest.  CT Thoracic Spine: WO Contrast IMPRESSION: No fracture is seen. Specifically, the left T2 transverse process is intact. Neurology Consulted: She was maintained on low- dose aspirin and Plavix.   Jenny Acosta was admitted to inpatient rehabilitation on 08/27/2022 and discharged to daughter home on 09/03/2022.  She is receiving Home Health Therapy from [facility].  She states her  pain is  in her bilateral shoulders, lower back pain radiating into her bilateral hips and bilateral lower extremities. She rates her pain 8.  Also reports she has a good appetite.   Jenny Acosta Morphine equivalent is 40.00 MME.   She is prescribed Tramadol by Dr Jonny Ruiz.     Pain Inventory Average Pain 7 Pain Right Now 8 My pain is constant, sharp, burning, dull, stabbing, tingling, and aching  LOCATION OF PAIN  shoulder wrist hand fingers back buttocks hip thigh knee leg and toes  BOWEL Number of stools per week: 5 Oral laxative use Yes  Type of laxative miralax  BLADDER Normal and Pads  Mobility walk with assistance use a walker how many minutes can you walk? 5-10 ability to climb steps?  no do you drive?  no needs help with transfers  Function retired I need assistance with the following:  feeding, dressing, bathing, toileting, meal prep, household duties, and shopping  Neuro/Psych weakness tremor trouble walking spasms dizziness depression anxiety  Prior Studies Any changes since last visit?  no  Physicians involved in your care Any changes since last visit?  no   Family History  Problem Relation Age of Onset   Colon cancer Mother    Stroke Mother    Arthritis Sister    Atrial fibrillation Sister        has pacemaker   Diabetes Maternal Grandmother    Diabetes Daughter    Stomach cancer Neg Hx  Social History   Socioeconomic History   Marital status: Divorced    Spouse name: Not on file   Number of children: Not on file   Years of education: Not on  file   Highest education level: Not on file  Occupational History   Not on file  Tobacco Use   Smoking status: Never   Smokeless tobacco: Never  Substance and Sexual Activity   Alcohol use: No   Drug use: No   Sexual activity: Not on file  Other Topics Concern   Not on file  Social History Narrative   Not on file   Social Determinants of Health   Financial Resource Strain: Low Risk  (05/27/2021)   Overall Financial Resource Strain (CARDIA)    Difficulty of Paying Living Expenses: Not hard at all  Food Insecurity: No Food Insecurity (08/27/2022)   Hunger Vital Sign    Worried About Running Out of Food in the Last Year: Never true    Ran Out of Food in the Last Year: Never true  Transportation Needs: No Transportation Needs (08/22/2022)   PRAPARE - Administrator, Civil Service (Medical): No    Lack of Transportation (Non-Medical): No  Physical Activity: Inactive (05/27/2021)   Exercise Vital Sign    Days of Exercise per Week: 0 days    Minutes of Exercise per Session: 0 min  Stress: No Stress Concern Present (05/27/2021)   [employer] of Occupational Health - Occupational Stress Questionnaire    Feeling of Stress : Not at all  Social Connections: Unknown (05/27/2021)   Social Connection and Isolation Panel [NHANES]    Frequency of Communication with Friends and Family: More than three times a week    Frequency of Social Gatherings with Friends and Family: More than three times a week    Attends Religious Services: Patient declined    Database administrator or Organizations: Patient declined    Attends Engineer, structural: Patient declined    Marital Status: Divorced   Past Surgical History:  Procedure Laterality Date   APPENDECTOMY  1953   CHOLECYSTECTOMY  2002   CHOLECYSTECTOMY OPEN  10 years ago   scarlet fever     age 32   TONSILLECTOMY  43   Past Medical History:  Diagnosis Date   Allergic rhinitis, cause unspecified    Arthritis     Carotid stenosis    Family history of colon cancer 07/21/2013   mother   Gallstone    GERD (gastroesophageal reflux disease)    Hyperlipidemia 08/30/2010   Hypertension    Hypothyroidism    Impaired glucose tolerance    Pain, coccyx    Right lumbar radiculopathy 08/30/2010   BP (!) 147/73   Pulse 100   Ht 5\' 5"  (1.651 m)   Wt 238 lb 3.2 oz (108 kg)   SpO2 94%   BMI 39.64 kg/m   Opioid Risk Score:   Fall Risk Score:  `1  Depression screen [facility] 2/9     10/06/2022    3:38 PM 06/17/2022    2:49 PM 05/27/2021    3:22 PM 02/27/2020    4:34 PM 02/27/2020    3:41 PM 01/11/2019    4:14 PM 10/27/2017    2:58 PM  Depression screen PHQ 2/9  Decreased Interest 0 3 2 0 0 0 0  Down, Depressed, Hopeless 0 3 2 0 0 1 0  PHQ - 2 Score 0 6 4 0 0 1 0  Altered sleeping 3 3 3       Tired, decreased energy 3 3 3       Change in appetite 1 2 3       Feeling bad or failure about yourself  0 2 0      Trouble concentrating 0 2 3      Moving slowly or fidgety/restless 2 1 0      Suicidal thoughts 0 0 0      PHQ-9 Score 9 19 16       Difficult doing work/chores Extremely dIfficult Very difficult          Review of Systems  Constitutional:  Positive for diaphoresis.  HENT: Negative.    Eyes: Negative.   Respiratory:  Positive for cough, shortness of breath and wheezing.   Cardiovascular: Negative.   Gastrointestinal:  Positive for abdominal pain, constipation and nausea.  Endocrine: Negative.   Genitourinary: Negative.   Musculoskeletal:  Positive for arthralgias, gait problem and myalgias.  Skin: Negative.   Allergic/Immunologic: Negative.   Neurological:  Positive for dizziness, tremors and weakness.  Hematological:  Bruises/bleeds easily.       Plavix  Psychiatric/Behavioral:  Positive for dysphoric mood. The patient is nervous/anxious.   All other systems reviewed and are negative.      Objective:   Physical Exam Vitals and nursing note reviewed.  Constitutional:      Appearance: Normal  appearance. She is obese.  Cardiovascular:     Rate and Rhythm: Normal rate and regular rhythm.     Pulses: Normal pulses.     Heart sounds: Normal heart sounds.  Pulmonary:     Effort: Pulmonary effort is normal.     Breath sounds: Normal breath sounds.  Musculoskeletal:     Cervical back: Normal range of motion and neck supple.     Comments: Normal Muscle Bulk and Muscle Testing Reveals:  Upper Extremities: Full ROM and Muscle Strength 5/5 Lower Extremities: Full ROM and Muscle Strength 5/5 Arises from Table slowly using walker for support Narrow Based  Gait     Skin:    General: Skin is warm and dry.  Neurological:     Mental Status: She is alert and oriented to person, place, and time.  Psychiatric:        Mood and Affect: Mood normal.        Behavior: Behavior normal.          Assessment & Plan:  Cerebrovascular accident due to occlusion of right posterior communicating artery: Continue Home Health Therapy with [facility] .She has a scheduled appointment with Dr Pearlean Brownie. Continue current Medication regimen.  2.  Essential Hypertension: Continue current medication regimen. PCP following. Continue to Monitor.  3., Hyperlipidemia: Continue current medication regimen. Continue to monitor. PCP following.  4, Chronic Bilateral Shoulder Pain: Continue HEP as Tolerated. Continue to Monitor.  5. Lumbar Radiculitis.: Continue Tramadol as prescribed by PCP. Continue HEP as Tolerated. Continue to Monitor.  F/U with Dr Wynn Banker in 4- 6 weeks

## 2022-10-07 ENCOUNTER — Telehealth: Payer: Self-pay | Admitting: Internal Medicine

## 2022-10-07 DIAGNOSIS — M1990 Unspecified osteoarthritis, unspecified site: Secondary | ICD-10-CM | POA: Diagnosis not present

## 2022-10-07 DIAGNOSIS — K7460 Unspecified cirrhosis of liver: Secondary | ICD-10-CM | POA: Diagnosis not present

## 2022-10-07 DIAGNOSIS — I69398 Other sequelae of cerebral infarction: Secondary | ICD-10-CM | POA: Diagnosis not present

## 2022-10-07 DIAGNOSIS — W19XXXD Unspecified fall, subsequent encounter: Secondary | ICD-10-CM | POA: Diagnosis not present

## 2022-10-07 DIAGNOSIS — I651 Occlusion and stenosis of basilar artery: Secondary | ICD-10-CM | POA: Diagnosis not present

## 2022-10-07 DIAGNOSIS — I7121 Aneurysm of the ascending aorta, without rupture: Secondary | ICD-10-CM | POA: Diagnosis not present

## 2022-10-07 DIAGNOSIS — E039 Hypothyroidism, unspecified: Secondary | ICD-10-CM | POA: Diagnosis not present

## 2022-10-07 DIAGNOSIS — I10 Essential (primary) hypertension: Secondary | ICD-10-CM | POA: Diagnosis not present

## 2022-10-07 DIAGNOSIS — E785 Hyperlipidemia, unspecified: Secondary | ICD-10-CM | POA: Diagnosis not present

## 2022-10-07 DIAGNOSIS — M6281 Muscle weakness (generalized): Secondary | ICD-10-CM | POA: Diagnosis not present

## 2022-10-07 NOTE — Telephone Encounter (Signed)
Caller [employer]:  Charri from [facility]   Phone Number: [phone]   Needs Verbal orders for what service & frequency:  Extend physical therapy 1 week x 4

## 2022-10-08 NOTE — Telephone Encounter (Signed)
Called and gave verbals.

## 2022-10-08 NOTE — Telephone Encounter (Signed)
Ok for verbal 

## 2022-10-17 ENCOUNTER — Inpatient Hospital Stay: Payer: 59 | Admitting: Internal Medicine

## 2022-10-23 ENCOUNTER — Other Ambulatory Visit: Payer: Self-pay | Admitting: Internal Medicine

## 2022-10-23 ENCOUNTER — Other Ambulatory Visit: Payer: Self-pay

## 2022-10-24 DIAGNOSIS — E039 Hypothyroidism, unspecified: Secondary | ICD-10-CM | POA: Diagnosis not present

## 2022-10-24 DIAGNOSIS — K7460 Unspecified cirrhosis of liver: Secondary | ICD-10-CM | POA: Diagnosis not present

## 2022-10-24 DIAGNOSIS — I651 Occlusion and stenosis of basilar artery: Secondary | ICD-10-CM | POA: Diagnosis not present

## 2022-10-24 DIAGNOSIS — I69398 Other sequelae of cerebral infarction: Secondary | ICD-10-CM | POA: Diagnosis not present

## 2022-10-24 DIAGNOSIS — E785 Hyperlipidemia, unspecified: Secondary | ICD-10-CM | POA: Diagnosis not present

## 2022-10-24 DIAGNOSIS — I10 Essential (primary) hypertension: Secondary | ICD-10-CM | POA: Diagnosis not present

## 2022-10-24 DIAGNOSIS — I7121 Aneurysm of the ascending aorta, without rupture: Secondary | ICD-10-CM | POA: Diagnosis not present

## 2022-10-24 DIAGNOSIS — M1990 Unspecified osteoarthritis, unspecified site: Secondary | ICD-10-CM | POA: Diagnosis not present

## 2022-10-24 DIAGNOSIS — W19XXXD Unspecified fall, subsequent encounter: Secondary | ICD-10-CM | POA: Diagnosis not present

## 2022-10-24 DIAGNOSIS — M6281 Muscle weakness (generalized): Secondary | ICD-10-CM | POA: Diagnosis not present

## 2022-10-28 ENCOUNTER — Other Ambulatory Visit: Payer: Self-pay

## 2022-10-30 NOTE — Progress Notes (Deleted)
[facility] [address] [place] [address] Phone: [phone] Subjective:    I'm seeing this patient by the request  of:  [name], MD  CC: knee pain and fibromyalgia   ONG:[id number]  03/15/2013 Patient did have injection as described above. Patient tolerated the procedure well and was given home exercise program to try. The discussed course strength. Patient will followup again in 3 weeks for further evaluation.  Patient did have extreme benefit from the injections previously. Patient will continue the home exercises 3 times a week. Patient will come back on an as-needed basis for this problem.     Updated 11/05/2022 Jenny Acosta is a 77 y.o. female coming in with complaint of knee pain and fibromyalgia.  Reviewing patient's chart recently did have a CVA in July.  This was in the right posterior basilar artery    Past Medical History:  Diagnosis Date   Allergic rhinitis, cause unspecified    Arthritis    Carotid stenosis    Family history of colon cancer 07/21/2013   mother   Gallstone    GERD (gastroesophageal reflux disease)    Hyperlipidemia 08/30/2010   Hypertension    Hypothyroidism    Impaired glucose tolerance    Pain, coccyx    Right lumbar radiculopathy 08/30/2010   Past Surgical History:  Procedure Laterality Date   APPENDECTOMY  1953   CHOLECYSTECTOMY  2002   CHOLECYSTECTOMY OPEN  10 years ago   scarlet fever     age 74   TONSILLECTOMY  36   Social History   Socioeconomic History   Marital status: Divorced    Spouse name: Not on file   Number of children: Not on file   Years of education: Not on file   Highest education level: Not on file  Occupational History   Not on file  Tobacco Use   Smoking status: Never   Smokeless tobacco: Never  Substance and Sexual Activity   Alcohol use: No   Drug use: No   Sexual activity: Not on file  Other Topics Concern   Not on file  Social History Narrative   Not  on file   Social Determinants of Health   Financial Resource Strain: Low Risk  (05/27/2021)   Overall Financial Resource Strain (CARDIA)    Difficulty of Paying Living Expenses: Not hard at all  Food Insecurity: No Food Insecurity (08/27/2022)   Hunger Vital Sign    Worried About Running Out of Food in the Last Year: Never true    Ran Out of Food in the Last Year: Never true  Transportation Needs: No Transportation Needs (08/22/2022)   PRAPARE - Administrator, Civil Service (Medical): No    Lack of Transportation (Non-Medical): No  Physical Activity: Inactive (05/27/2021)   Exercise Vital Sign    Days of Exercise per Week: 0 days    Minutes of Exercise per Session: 0 min  Stress: No Stress Concern Present (05/27/2021)   [employer] of Occupational Health - Occupational Stress Questionnaire    Feeling of Stress : Not at all  Social Connections: Unknown (05/27/2021)   Social Connection and Isolation Panel [NHANES]    Frequency of Communication with Friends and Family: More than three times a week    Frequency of Social Gatherings with Friends and Family: More than three times a week    Attends Religious Services: Patient declined    Database administrator or Organizations: Patient  declined    Attends Banker Meetings: Patient declined    Marital Status: Divorced   Allergies  Allergen Reactions   Lipitor [Atorvastatin]     myalgia   Methocarbamol Other (See Comments)    hallucinations   Simvastatin     Hair loss   Sulfa Antibiotics     unknown   Family History  Problem Relation Age of Onset   Colon cancer Mother    Stroke Mother    Arthritis Sister    Atrial fibrillation Sister        has pacemaker   Diabetes Maternal Grandmother    Diabetes Daughter    Stomach cancer Neg Hx     Current Outpatient Medications (Endocrine & Metabolic):    levothyroxine (SYNTHROID) 100 MCG tablet, Take 1 tablet (100 mcg total) by mouth daily before  breakfast.  Current Outpatient Medications (Cardiovascular):    Evolocumab (REPATHA SURECLICK) 140 MG/ML SOAJ, Inject 140 mg into the skin every 14 (fourteen) days.   ezetimibe (ZETIA) 10 MG tablet, Take 1 tablet (10 mg total) by mouth daily.  Current Outpatient Medications (Respiratory):    albuterol (VENTOLIN HFA) 108 (90 Base) MCG/ACT inhaler, Inhale 2 puffs into the lungs every 6 (six) hours as needed for wheezing or shortness of breath.   loratadine (CLARITIN) 10 MG tablet, Take 1 tablet (10 mg total) by mouth daily.   promethazine-codeine (PHENERGAN WITH CODEINE) 6.25-10 MG/5ML syrup, Take 5 mLs by mouth every 4 (four) hours as needed for up to 10 days for cough.  Current Outpatient Medications (Analgesics):    acetaminophen (TYLENOL) 500 MG tablet, Take 500 mg by mouth every 6 (six) hours as needed. For pain   aspirin 81 MG chewable tablet, Chew 1 tablet (81 mg total) by mouth daily.   traMADol (ULTRAM) 50 MG tablet, TAKE 1 TABLET BY MOUTH EVERY 6 HOURS AS NEEDED  Current Outpatient Medications (Hematological):    clopidogrel (PLAVIX) 75 MG tablet, Take 1 tablet (75 mg total) by mouth daily.  Current Outpatient Medications (Other):    Cholecalciferol (THERA-D 2000) 50 MCG (2000 UT) TABS, 1 tab by mouth epr day (Patient taking differently: Take 1 tablet by mouth daily.)   DHA-EPA-Vitamin E (OMEGA-3 COMPLEX PO), Take by mouth.   lidocaine (LIDODERM) 5 %, Place 2 patches onto the skin daily. Remove & Discard patch within 12 hours or as directed by MD   meclizine (ANTIVERT) 25 MG tablet, Take 1 tablet (25 mg total) by mouth 3 (three) times daily as needed for dizziness.   nortriptyline (PAMELOR) 10 MG capsule, 1-3 TABS BY MOUTH AT BEDTIME AS NEEDED FOR PAIN AND SLEEP   ondansetron (ZOFRAN-ODT) 4 MG disintegrating tablet, Take 1 tablet (4 mg total) by mouth every 8 (eight) hours as needed for nausea or vomiting.   pantoprazole (PROTONIX) 40 MG tablet, Take 1 tablet (40 mg total) by mouth  daily.   PARoxetine (PAXIL) 30 MG tablet, Take 1 tablet (30 mg total) by mouth daily.   polyethylene glycol (MIRALAX / GLYCOLAX) 17 g packet, Take 17 g by mouth daily.   tiZANidine (ZANAFLEX) 2 MG tablet, Take 1 tablet (2 mg total) by mouth every 8 (eight) hours as needed for muscle spasms.   Reviewed prior external information including notes and imaging from  primary care provider As well as notes that were available from care everywhere and other healthcare systems.  Past medical history, social, surgical and family history all reviewed in electronic medical record.  No pertanent information  unless stated regarding to the chief complaint.   Review of Systems:  No headache, visual changes, nausea, vomiting, diarrhea, constipation, dizziness, abdominal pain, skin rash, fevers, chills, night sweats, weight loss, swollen lymph nodes, body aches, joint swelling, chest pain, shortness of breath, mood changes. POSITIVE muscle aches  Objective  There were no vitals taken for this visit.   General: No apparent distress alert and oriented x3 mood and affect normal, dressed appropriately.  HEENT: Pupils equal, extraocular movements intact  Respiratory: Patient's speak in full sentences and does not appear short of breath  Cardiovascular: No lower extremity edema, non tender, no erythema      Impression and Recommendations:    The above documentation has been reviewed and is accurate and complete Judi Saa, DO

## 2022-11-03 ENCOUNTER — Inpatient Hospital Stay: Payer: 59 | Admitting: Registered Nurse

## 2022-11-04 ENCOUNTER — Ambulatory Visit (INDEPENDENT_AMBULATORY_CARE_PROVIDER_SITE_OTHER): Payer: 59 | Admitting: Internal Medicine

## 2022-11-04 ENCOUNTER — Encounter: Payer: Self-pay | Admitting: Internal Medicine

## 2022-11-04 VITALS — BP 128/76 | HR 100 | Temp 99.6°F | Ht 65.0 in | Wt 235.0 lb

## 2022-11-04 DIAGNOSIS — R413 Other amnesia: Secondary | ICD-10-CM

## 2022-11-04 DIAGNOSIS — R7302 Impaired glucose tolerance (oral): Secondary | ICD-10-CM | POA: Diagnosis not present

## 2022-11-04 DIAGNOSIS — R1312 Dysphagia, oropharyngeal phase: Secondary | ICD-10-CM | POA: Diagnosis not present

## 2022-11-04 DIAGNOSIS — E7849 Other hyperlipidemia: Secondary | ICD-10-CM | POA: Diagnosis not present

## 2022-11-04 DIAGNOSIS — R1310 Dysphagia, unspecified: Secondary | ICD-10-CM | POA: Insufficient documentation

## 2022-11-04 DIAGNOSIS — R911 Solitary pulmonary nodule: Secondary | ICD-10-CM | POA: Diagnosis not present

## 2022-11-04 DIAGNOSIS — E538 Deficiency of other specified B group vitamins: Secondary | ICD-10-CM | POA: Diagnosis not present

## 2022-11-04 DIAGNOSIS — Z8673 Personal history of transient ischemic attack (TIA), and cerebral infarction without residual deficits: Secondary | ICD-10-CM | POA: Diagnosis not present

## 2022-11-04 DIAGNOSIS — E559 Vitamin D deficiency, unspecified: Secondary | ICD-10-CM | POA: Diagnosis not present

## 2022-11-04 DIAGNOSIS — I10 Essential (primary) hypertension: Secondary | ICD-10-CM

## 2022-11-04 MED ORDER — ALBUTEROL SULFATE HFA 108 (90 BASE) MCG/ACT IN AERS: 2.0000 | INHALATION_SPRAY | Freq: Four times a day (QID) | RESPIRATORY_TRACT | 5 refills | Status: DC | PRN

## 2022-11-04 MED ORDER — PROMETHAZINE-CODEINE 6.25-10 MG/5ML PO SYRP: 5.0000 mL | ORAL_SOLUTION | ORAL | 0 refills | Status: AC | PRN

## 2022-11-04 MED ORDER — ONDANSETRON 4 MG PO TBDP: 4.0000 mg | ORAL_TABLET | Freq: Three times a day (TID) | ORAL | 1 refills | Status: DC | PRN

## 2022-11-04 MED ORDER — LEVOTHYROXINE SODIUM 100 MCG PO TABS: 100.0000 ug | ORAL_TABLET | Freq: Every day | ORAL | 3 refills | Status: DC

## 2022-11-04 MED ORDER — LIDOCAINE 5 % EX PTCH: 2.0000 | MEDICATED_PATCH | CUTANEOUS | 2 refills | Status: DC

## 2022-11-04 MED ORDER — MECLIZINE HCL 25 MG PO TABS: 25.0000 mg | ORAL_TABLET | Freq: Three times a day (TID) | ORAL | 2 refills | Status: DC | PRN

## 2022-11-04 MED ORDER — REPATHA SURECLICK 140 MG/ML ~~LOC~~ SOAJ: 140.0000 mg | SUBCUTANEOUS | 3 refills | Status: DC

## 2022-11-04 MED ORDER — CLOPIDOGREL BISULFATE 75 MG PO TABS: 75.0000 mg | ORAL_TABLET | Freq: Every day | ORAL | 1 refills | Status: DC

## 2022-11-04 MED ORDER — EZETIMIBE 10 MG PO TABS: 10.0000 mg | ORAL_TABLET | Freq: Every day | ORAL | 3 refills | Status: DC

## 2022-11-04 NOTE — Assessment & Plan Note (Signed)
BP Readings from Last 3 Encounters:  11/04/22 128/76  10/06/22 132/84  09/03/22 (!) 142/82   Stable, pt to continue medical treatment  - diet, wt control

## 2022-11-04 NOTE — Progress Notes (Signed)
Patient ID: Jenny Acosta, female   DOB: 1945/05/28, 77 y.o.   MRN: [id number]        Chief Complaint: follow up stroke hospn July 3 - 10, memory changes, hld, right 3 mm pulmonary nodule, dysphagia       HPI:  Jenny Acosta is a 77 y.o. female here with daughter, was hospd with CVA with occluded right posterior basilar artery, Pt denies chest pain, increased sob or doe, wheezing, orthopnea, PND, increased LE swelling, palpitations, dizziness or syncope.   Pt denies polydipsia, polyuria, or new focal neuro s/s, but still has occasional difficultly swallowing solids with choke and cough. .  Unfortunately did not restart the crestor due to prior leg pain.  Willing to try repatha and continue zetia.  Has been compliant with asa, plavix.  In addition and separately has had prior to cva mild memory changes now worsening possibly post cva.  Did also have an unfortunate slide and fall out of bed with bruising to the right posterolateral chest relatively small area, minor tender at worst.  Has neurology f/u appt oct 2024.  Incidentally also has  3 mm pulm nodule right mid lung by CT with recommendation for f/u CT now         Wt Readings from Last 3 Encounters:  11/04/22 235 lb (106.6 kg)  10/06/22 238 lb 3.2 oz (108 kg)  08/27/22 240 lb 1.6 oz (108.9 kg)   BP Readings from Last 3 Encounters:  11/04/22 128/76  10/06/22 132/84  09/03/22 (!) 142/82         Past Medical History:  Diagnosis Date   Allergic rhinitis, cause unspecified    Arthritis    Carotid stenosis    Family history of colon cancer 07/21/2013   mother   Gallstone    GERD (gastroesophageal reflux disease)    Hyperlipidemia 08/30/2010   Hypertension    Hypothyroidism    Impaired glucose tolerance    Pain, coccyx    Right lumbar radiculopathy 08/30/2010   Past Surgical History:  Procedure Laterality Date   APPENDECTOMY  1953   CHOLECYSTECTOMY  2002   CHOLECYSTECTOMY OPEN  10 years ago   scarlet fever     age 88   TONSILLECTOMY   1959    reports that she has never smoked. She has never used smokeless tobacco. She reports that she does not drink alcohol and does not use drugs. family history includes Arthritis in her sister; Atrial fibrillation in her sister; Colon cancer in her mother; Diabetes in her daughter and maternal grandmother; Stroke in her mother. Allergies  Allergen Reactions   Lipitor [Atorvastatin]     myalgia   Methocarbamol Other (See Comments)    hallucinations   Simvastatin     Hair loss   Sulfa Antibiotics     unknown   Current Outpatient Medications on File Prior to Visit  Medication Sig Dispense Refill   acetaminophen (TYLENOL) 500 MG tablet Take 500 mg by mouth every 6 (six) hours as needed. For pain     aspirin 81 MG chewable tablet Chew 1 tablet (81 mg total) by mouth daily.     Cholecalciferol (THERA-D 2000) 50 MCG (2000 UT) TABS 1 tab by mouth epr day (Patient taking differently: Take 1 tablet by mouth daily.) 30 tablet 99   DHA-EPA-Vitamin E (OMEGA-3 COMPLEX PO) Take by mouth.     loratadine (CLARITIN) 10 MG tablet Take 1 tablet (10 mg total) by mouth daily. 30 tablet 0   nortriptyline (  PAMELOR) 10 MG capsule 1-3 TABS BY MOUTH AT BEDTIME AS NEEDED FOR PAIN AND SLEEP 270 capsule 1   pantoprazole (PROTONIX) 40 MG tablet Take 1 tablet (40 mg total) by mouth daily. 90 tablet 2   PARoxetine (PAXIL) 30 MG tablet Take 1 tablet (30 mg total) by mouth daily. 90 tablet 2   polyethylene glycol (MIRALAX / GLYCOLAX) 17 g packet Take 17 g by mouth daily. 14 each 0   tiZANidine (ZANAFLEX) 2 MG tablet Take 1 tablet (2 mg total) by mouth every 8 (eight) hours as needed for muscle spasms. 30 tablet 0   traMADol (ULTRAM) 50 MG tablet TAKE 1 TABLET BY MOUTH EVERY 6 HOURS AS NEEDED 30 tablet 2   No current facility-administered medications on file prior to visit.        ROS:  All others reviewed and negative.  Objective        PE:  BP 128/76 (BP Location: Right Arm, Patient Position: Sitting, Cuff  Size: Normal)   Pulse 100   Temp 99.6 F (37.6 C) (Oral)   Ht 5\' 5"  (1.651 m)   Wt 235 lb (106.6 kg)   SpO2 96%   BMI 39.11 kg/m                 Constitutional: Pt appears in NAD               HENT: Head: NCAT.                Right Ear: External ear normal.                 Left Ear: External ear normal.                Eyes: . Pupils are equal, round, and reactive to light. Conjunctivae and EOM are normal               Nose: without d/c or deformity               Neck: Neck supple. Gross normal ROM               Cardiovascular: Normal rate and regular rhythm.                 Pulmonary/Chest: Effort normal and breath sounds without rales or wheezing.                Abd:  Soft, NT, ND, + BS, no organomegaly               Neurological: Pt is alert. At baseline orientation, motor grossly intact               Skin: Skin is warm. No rashes, no other new lesions, LE edema - none               Psychiatric: Pt behavior is normal without agitation   Micro: none  Cardiac tracings I have personally interpreted today:  none  Pertinent Radiological findings (summarize): none   Lab Results  Component Value Date   WBC 7.5 08/28/2022   HGB 13.7 08/28/2022   HCT 43.1 08/28/2022   PLT 224 08/28/2022   GLUCOSE 115 (H) 08/28/2022   CHOL 131 08/23/2022   TRIG 24 08/23/2022   HDL 60 08/23/2022   LDLDIRECT 121.0 06/17/2022   LDLCALC 66 08/23/2022   ALT 33 08/28/2022   AST 34 08/28/2022   NA 138 08/28/2022   K 4.1 08/28/2022   CL  103 08/28/2022   CREATININE 0.78 08/28/2022   BUN 12 08/28/2022   CO2 25 08/28/2022   TSH 0.538 08/22/2022   HGBA1C 6.3 (H) 08/23/2022   Assessment/Plan:  Jenny Acosta is a 77 y.o. White or Caucasian [1] female with  has a past medical history of Allergic rhinitis, cause unspecified, Arthritis, Carotid stenosis, Family history of colon cancer (07/21/2013), Gallstone, GERD (gastroesophageal reflux disease), Hyperlipidemia (08/30/2010), Hypertension, Hypothyroidism,  Impaired glucose tolerance, Pain, coccyx, and Right lumbar radiculopathy (08/30/2010).  History of arterial ischemic stroke Stable overall, cont asa and plavix, cont zetia 10 every day, start repatha 140 mg, and f/u neurology as planned oct 2024  Hyperlipidemia Lab Results  Component Value Date   LDLCALC 66 08/23/2022   Stable, pt to continue zetia 10 every day but repatha 140 mg as well with f/u lipids today   Hypertension BP Readings from Last 3 Encounters:  11/04/22 128/76  10/06/22 132/84  09/03/22 (!) 142/82   Stable, pt to continue medical treatment  - diet, wt control   Impaired glucose tolerance Lab Results  Component Value Date   HGBA1C 6.3 (H) 08/23/2022   Stable, pt to continue current medical treatment  - diet, wt control   Vitamin D deficiency Last vitamin D Lab Results  Component Value Date   VD25OH 33.32 06/17/2022   Low to start oral replacement   Dysphagia For phen codeine cough med prn per pt request for comfort, also for HH with RN, PT, and ST and aide; consdier GI referral but declines for now  Memory changes I believe separate problem from hx of cva - also for neurology f/u as planned  Solitary pulmonary nodule Incidental - for ct chest   Followup: Return in about 6 months (around 05/04/2023).  Oliver Barre, MD 11/04/2022 9:20 PM [facility]

## 2022-11-04 NOTE — Patient Instructions (Addendum)
Please continue the aspirin and plavix until your Neurology appointment, and you may be able to stop the plavix at that time  Please take all new medication as prescribed - the Repatha shots for cholesterol  Ok to stay off the crestor, but also keep taking the Zetia  Please take all new medication as prescribed - the codeine based cough syrup  Please continue all other medications as before, and refills have been done if requested.  Please have the pharmacy call with any other refills you may need.  Please continue your efforts at being more active, low cholesterol diet, and weight control.  Please keep your appointments with your specialists as you may have planned - Neurology Oct 2024 - and remember to ask about the memory changes that I think are a different problem from the stroke  You will be contacted regarding the referral for: Home Health with [facility] for RN, PT, ST, and aide  You will be contacted regarding the referral for: CT chest for the right lung small nodule  Please go to the LAB at the blood drawing area for the tests to be done  You will be contacted by phone if any changes need to be made immediately.  Otherwise, you will receive a letter about your results with an explanation, but please check with MyChart first.  Please make an Appointment to return in 6 months, or sooner if needed

## 2022-11-04 NOTE — Assessment & Plan Note (Signed)
Lab Results  Component Value Date   HGBA1C 6.3 (H) 08/23/2022   Stable, pt to continue current medical treatment  - diet, wt control

## 2022-11-04 NOTE — Assessment & Plan Note (Signed)
Last vitamin D Lab Results  Component Value Date   VD25OH 33.32 06/17/2022   Low to start oral replacement

## 2022-11-04 NOTE — Assessment & Plan Note (Addendum)
For phen codeine cough med prn per pt request for comfort, also for [facility] with RN, PT, and ST and aide; consdier GI referral but declines for now

## 2022-11-04 NOTE — Assessment & Plan Note (Signed)
I believe separate problem from hx of cva - also for neurology f/u as planned

## 2022-11-04 NOTE — Assessment & Plan Note (Signed)
Lab Results  Component Value Date   LDLCALC 66 08/23/2022   Stable, pt to continue zetia 10 every day but repatha 140 mg as well with f/u lipids today

## 2022-11-04 NOTE — Assessment & Plan Note (Signed)
Stable overall, cont asa and plavix, cont zetia 10 every day, start repatha 140 mg, and f/u neurology as planned oct 2024

## 2022-11-04 NOTE — Assessment & Plan Note (Signed)
Incidental - for ct chest

## 2022-11-05 ENCOUNTER — Ambulatory Visit: Payer: 59 | Admitting: Family Medicine

## 2022-11-06 DIAGNOSIS — E785 Hyperlipidemia, unspecified: Secondary | ICD-10-CM | POA: Diagnosis not present

## 2022-11-06 DIAGNOSIS — I69398 Other sequelae of cerebral infarction: Secondary | ICD-10-CM | POA: Diagnosis not present

## 2022-11-06 DIAGNOSIS — I651 Occlusion and stenosis of basilar artery: Secondary | ICD-10-CM | POA: Diagnosis not present

## 2022-11-06 DIAGNOSIS — E039 Hypothyroidism, unspecified: Secondary | ICD-10-CM | POA: Diagnosis not present

## 2022-11-06 DIAGNOSIS — K7460 Unspecified cirrhosis of liver: Secondary | ICD-10-CM | POA: Diagnosis not present

## 2022-11-06 DIAGNOSIS — M6281 Muscle weakness (generalized): Secondary | ICD-10-CM | POA: Diagnosis not present

## 2022-11-06 DIAGNOSIS — M1990 Unspecified osteoarthritis, unspecified site: Secondary | ICD-10-CM | POA: Diagnosis not present

## 2022-11-06 DIAGNOSIS — I7121 Aneurysm of the ascending aorta, without rupture: Secondary | ICD-10-CM | POA: Diagnosis not present

## 2022-11-06 DIAGNOSIS — I10 Essential (primary) hypertension: Secondary | ICD-10-CM | POA: Diagnosis not present

## 2022-11-06 DIAGNOSIS — W19XXXD Unspecified fall, subsequent encounter: Secondary | ICD-10-CM | POA: Diagnosis not present

## 2022-11-07 ENCOUNTER — Other Ambulatory Visit: Payer: Self-pay | Admitting: Internal Medicine

## 2022-11-07 ENCOUNTER — Telehealth: Payer: Self-pay | Admitting: Internal Medicine

## 2022-11-07 MED ORDER — PROMETHAZINE-DM 6.25-15 MG/5ML PO SYRP: 5.0000 mL | ORAL_SOLUTION | Freq: Four times a day (QID) | ORAL | 0 refills | Status: DC | PRN

## 2022-11-07 NOTE — Telephone Encounter (Signed)
Ok for verbals 

## 2022-11-07 NOTE — Telephone Encounter (Signed)
Caller [employer]:  Jenny Acosta from [facility]   Phone Number:  [phone]   Needs Verbal orders for what service & frequency:  Physical therapy 1 week x 6

## 2022-11-10 NOTE — Telephone Encounter (Signed)
Called and gave verbals.

## 2022-11-11 ENCOUNTER — Encounter: Payer: Self-pay | Admitting: Physical Medicine & Rehabilitation

## 2022-11-11 ENCOUNTER — Encounter: Payer: 59 | Attending: Physical Medicine & Rehabilitation | Admitting: Physical Medicine & Rehabilitation

## 2022-11-11 VITALS — BP 134/81 | HR 91 | Ht 65.0 in | Wt 233.0 lb

## 2022-11-11 DIAGNOSIS — R413 Other amnesia: Secondary | ICD-10-CM | POA: Insufficient documentation

## 2022-11-11 DIAGNOSIS — I6329 Cerebral infarction due to unspecified occlusion or stenosis of other precerebral arteries: Secondary | ICD-10-CM | POA: Insufficient documentation

## 2022-11-11 DIAGNOSIS — M797 Fibromyalgia: Secondary | ICD-10-CM | POA: Insufficient documentation

## 2022-11-11 NOTE — Progress Notes (Signed)
Subjective:    Patient ID: Jenny Acosta, female    DOB: [birth year], 77 y.o.   MRN: [id number] 78 y.o. right-handed female with history significant for carotid stenosis ascending thoracic aortic aneurysm, hyperlipidemia hypothyroidism hypertension.  Per chart review lives alone independent living facility.  Ambulates with a rollator for community distances.  Reports 1 fall in the past 6 months.  She does have a daughter in the area to provide assistance.  Presented 08/21/2022 with weakness dizziness sustaining a fall.  Patient had developed some lightheadedness and her legs gave out.  Denied loss of consciousness.  She was able to get up on her own.  CT/MRI positive for acute infarction in the right cerebellum right SCA territory and vermis and mildly scattered in the right PCA territory.  Chronic posterior right MCA infarct new since 2015 brain MRI.  CTA of head and neck severely near occlusive basilar artery stenosis with appearance that could reflect possible dissection particularly given progression since 2015.  Moderate right M1 MCA stenosis.  Approximately 50% stenosis of the proximal right ICA in the neck.  Partially imaged ascending aortic aneurysm.  Scans were reviewed by neurology dissection was doubtful.  CT thoracic spine no fracture seen.  Admission chemistries unremarkable troponin negative.  Echocardiogram with ejection fraction of 60 to 65% no wall motion abnormalities grade 1 diastolic dysfunction.  Maintained on low-dose aspirin and Plavix for CVA prophylaxis.  Neurology was considering Brilinta if she was able to afford it.  Subcutaneous heparin added for DVT prophylaxis.  Patient did complete a course of Augmentin for question aspiration in the setting of vomiting episode and currently remained stable.  Therapy evaluations completed due to patient decreased functional mobility was admitted for a comprehensive rehab programAdmit date: 08/27/2022 Discharge date: 09/03/2022 HPI  Has home  Health still coming out  Had frequent falls prior to stroke and started using a walker  Family supervised bathing even prior to stroke. Activity limited by fibromyalgia syndrome.  She asked for other medications that may help her in this regard.  We discussed duloxetine is an option for her but she will need to discuss this with her primary care physician. Her and her daughter asked about whether the patient had cirrhosis.  We discussed the last lab test which showed normal transaminase.  She has not had any recent hepatic imaging.  We discussed that she would likely need further workup to answer this question or perhaps see an gastroenterologist.  Would defer to Dr. Jonny Ruiz on this matter. She also asked about dental work.  She plans to have some teeth pulled.  We discussed that after October 1 she should be off Plavix and only on aspirin.  Usually neurology gives the clearance for stopping aspirin as needed after stroke.  Generally speaking we discussed that 6 months is the time that usually is the minimum prior to starting with elective procedures.  She has an appointment with neurology October 17 Has had  Pain Inventory Average Pain 7 Pain Right Now 6 My pain is constant, dull, tingling, and aching  In the last 24 hours, has pain interfered with the following? General activity 6 Relation with others 6 Enjoyment of life 6 What TIME of day is your pain at its worst? varies Sleep (in general) Good  Pain is worse with:  . Pain improves with: medication Relief from Meds: 5  Family History  Problem Relation Age of Onset   Colon cancer Mother    Stroke Mother  Arthritis Sister    Atrial fibrillation Sister        has pacemaker   Diabetes Maternal Grandmother    Diabetes Daughter    Stomach cancer Neg Hx    Social History   Socioeconomic History   Marital status: Divorced    Spouse name: Not on file   Number of children: Not on file   Years of education: Not on file   Highest  education level: Not on file  Occupational History   Not on file  Tobacco Use   Smoking status: Never   Smokeless tobacco: Never  Substance and Sexual Activity   Alcohol use: No   Drug use: No   Sexual activity: Not on file  Other Topics Concern   Not on file  Social History Narrative   Not on file   Social Determinants of Health   Financial Resource Strain: Low Risk  (05/27/2021)   Overall Financial Resource Strain (CARDIA)    Difficulty of Paying Living Expenses: Not hard at all  Food Insecurity: No Food Insecurity (08/27/2022)   Hunger Vital Sign    Worried About Running Out of Food in the Last Year: Never true    Ran Out of Food in the Last Year: Never true  Transportation Needs: No Transportation Needs (08/22/2022)   PRAPARE - Administrator, Civil Service (Medical): No    Lack of Transportation (Non-Medical): No  Physical Activity: Inactive (05/27/2021)   Exercise Vital Sign    Days of Exercise per Week: 0 days    Minutes of Exercise per Session: 0 min  Stress: No Stress Concern Present (05/27/2021)   [employer] of Occupational Health - Occupational Stress Questionnaire    Feeling of Stress : Not at all  Social Connections: Unknown (05/27/2021)   Social Connection and Isolation Panel [NHANES]    Frequency of Communication with Friends and Family: More than three times a week    Frequency of Social Gatherings with Friends and Family: More than three times a week    Attends Religious Services: Patient declined    Database administrator or Organizations: Patient declined    Attends Banker Meetings: Patient declined    Marital Status: Divorced   Past Surgical History:  Procedure Laterality Date   APPENDECTOMY  1953   CHOLECYSTECTOMY  2002   CHOLECYSTECTOMY OPEN  10 years ago   scarlet fever     age 72   TONSILLECTOMY  43   Past Surgical History:  Procedure Laterality Date   APPENDECTOMY  1953   CHOLECYSTECTOMY  2002   CHOLECYSTECTOMY  OPEN  10 years ago   scarlet fever     age 79   TONSILLECTOMY  50   Past Medical History:  Diagnosis Date   Allergic rhinitis, cause unspecified    Arthritis    Carotid stenosis    Family history of colon cancer 07/21/2013   mother   Gallstone    GERD (gastroesophageal reflux disease)    Hyperlipidemia 08/30/2010   Hypertension    Hypothyroidism    Impaired glucose tolerance    Pain, coccyx    Right lumbar radiculopathy 08/30/2010   BP 134/81   Pulse 91   Ht 5\' 5"  (1.651 m)   Wt 233 lb (105.7 kg)   SpO2 94%   BMI 38.77 kg/m   Opioid Risk Score:   Fall Risk Score:  `1  Depression screen [facility] 2/9     11/04/2022    4:18  PM 10/06/2022    3:38 PM 06/17/2022    2:49 PM 05/27/2021    3:22 PM 02/27/2020    4:34 PM 02/27/2020    3:41 PM 01/11/2019    4:14 PM  Depression screen PHQ 2/9  Decreased Interest 0 0 3 2 0 0 0  Down, Depressed, Hopeless 0 0 3 2 0 0 1  PHQ - 2 Score 0 0 6 4 0 0 1  Altered sleeping  3 3 3      Tired, decreased energy  3 3 3      Change in appetite  1 2 3      Feeling bad or failure about yourself   0 2 0     Trouble concentrating  0 2 3     Moving slowly or fidgety/restless  2 1 0     Suicidal thoughts  0 0 0     PHQ-9 Score  9 19 16      Difficult doing work/chores  Extremely dIfficult Very difficult          Review of Systems  Musculoskeletal:  Positive for myalgias.  All other systems reviewed and are negative.     Objective:   Physical Exam  General No acute distress Mood and affect are appropriate Speech without dysarthria or aphasia There is no evidence of visual field cut Eyes without nystagmus Motor strength is 5/5 bilateral deltoid, bicep, tricep, grip, hip flexor, knee extensor, ankle dorsiflexor and plantar flexor There is minimal tremor left upper extremity with finger-nose-finger testing but no overt signs of dysmetria in the upper extremities. She goes from sit to stand without physical assistance.  She can stand without physical  assistance.  She has a mildly widened base of support. She has no tenderness palpation over the upper traps over the medial knees over the elbows. She has full range of motion in the upper limbs and lower limbs.     Assessment & Plan:  1.  Recent right cerebellar and right PCA distribution CVA at this time she does not have any significant residual neurologic deficits that I can appreciate on my exam today.  We discussed that the plan from acute care was for her to be on aspirin and Plavix for 3 months and then aspirin alone after that time.  We discussed stopping the Plavix beginning October, 2024.  She will follow-up with neurology for questions regarding elective procedures She will follow-up with primary care on secondary stroke prevention and questions regarding her liver. She will continue with her home health therapy but she is very close to her functional baseline.  She plans to move back to her apartment pretty soon.  She has been staying with her daughter. I will see the patient back on a as needed basis given excellent improvements after stroke. Daughter mentions that the patient sometimes keep the water on after leaving the kitchen.  The patient states that she cannot hear the water.  She has been using some written cues for this.  We discussed that this could be cognitive deficits poststroke and if so they may continue to improve over the next 6 months.  If these progress and may be worthwhile to pursue neuropsychologic testing.  The patient will follow-up with neurology.

## 2022-11-11 NOTE — Patient Instructions (Signed)
Stop plavix Oct 1.

## 2022-11-12 ENCOUNTER — Telehealth: Payer: Self-pay

## 2022-11-12 NOTE — Telephone Encounter (Signed)
Pharmacy Patient Advocate Encounter   Received notification from CoverMyMeds that prior authorization for Repatha SureClick 140MG /ML auto-injectors is required/requested.   Insurance verification completed.   The patient is insured through [facility] .   Per test claim: PA required; PA submitted to [facility] via CoverMyMeds Key/confirmation #/EOC [id number] Status is pending

## 2022-11-13 ENCOUNTER — Ambulatory Visit: Payer: 59 | Admitting: Physical Medicine & Rehabilitation

## 2022-11-13 NOTE — Telephone Encounter (Signed)
Pharmacy Patient Advocate Encounter  Received notification from [facility] that Prior Authorization for Repatha SureClick 140MG /ML auto-injectors  has been APPROVED from 11/12/22 to 05/12/23   PA #/Case ID/Reference #: [id number]

## 2022-11-14 ENCOUNTER — Other Ambulatory Visit (HOSPITAL_COMMUNITY): Payer: Self-pay

## 2022-11-14 ENCOUNTER — Telehealth: Payer: Self-pay

## 2022-11-14 DIAGNOSIS — M1990 Unspecified osteoarthritis, unspecified site: Secondary | ICD-10-CM | POA: Diagnosis not present

## 2022-11-14 DIAGNOSIS — E785 Hyperlipidemia, unspecified: Secondary | ICD-10-CM | POA: Diagnosis not present

## 2022-11-14 DIAGNOSIS — I10 Essential (primary) hypertension: Secondary | ICD-10-CM | POA: Diagnosis not present

## 2022-11-14 DIAGNOSIS — I651 Occlusion and stenosis of basilar artery: Secondary | ICD-10-CM | POA: Diagnosis not present

## 2022-11-14 DIAGNOSIS — K7460 Unspecified cirrhosis of liver: Secondary | ICD-10-CM | POA: Diagnosis not present

## 2022-11-14 DIAGNOSIS — I7121 Aneurysm of the ascending aorta, without rupture: Secondary | ICD-10-CM | POA: Diagnosis not present

## 2022-11-14 DIAGNOSIS — I69398 Other sequelae of cerebral infarction: Secondary | ICD-10-CM | POA: Diagnosis not present

## 2022-11-14 DIAGNOSIS — E039 Hypothyroidism, unspecified: Secondary | ICD-10-CM | POA: Diagnosis not present

## 2022-11-14 DIAGNOSIS — M6281 Muscle weakness (generalized): Secondary | ICD-10-CM | POA: Diagnosis not present

## 2022-11-14 DIAGNOSIS — W19XXXD Unspecified fall, subsequent encounter: Secondary | ICD-10-CM | POA: Diagnosis not present

## 2022-11-14 NOTE — Telephone Encounter (Signed)
Pharmacy Patient Advocate Encounter   Received notification from CoverMyMeds that prior authorization for Lidocaine 5% patches is required/requested.   Insurance verification completed.   The patient is insured through [facility] .   Per test claim: PA required; PA submitted to [facility] via CoverMyMeds Key/confirmation #/EOC Key: [id number]   Status is pending

## 2022-11-17 ENCOUNTER — Other Ambulatory Visit (HOSPITAL_COMMUNITY): Payer: Self-pay

## 2022-11-17 NOTE — Telephone Encounter (Signed)
Pharmacy Patient Advocate Encounter  Received notification from [facility] that Prior Authorization for Lidocaine 5% patches has been APPROVED from 9.20.24 to 12.31.24. Ran test claim, Copay is $0.00. This test claim was processed through [facility]- copay amounts may vary at other pharmacies due to pharmacy/plan contracts, or as the patient moves through the different stages of their insurance plan.   PA #/Case ID/Reference #: (Key: [id number])

## 2022-11-21 DIAGNOSIS — E039 Hypothyroidism, unspecified: Secondary | ICD-10-CM | POA: Diagnosis not present

## 2022-11-21 DIAGNOSIS — M1990 Unspecified osteoarthritis, unspecified site: Secondary | ICD-10-CM | POA: Diagnosis not present

## 2022-11-21 DIAGNOSIS — E785 Hyperlipidemia, unspecified: Secondary | ICD-10-CM | POA: Diagnosis not present

## 2022-11-21 DIAGNOSIS — I69398 Other sequelae of cerebral infarction: Secondary | ICD-10-CM | POA: Diagnosis not present

## 2022-11-21 DIAGNOSIS — I7121 Aneurysm of the ascending aorta, without rupture: Secondary | ICD-10-CM | POA: Diagnosis not present

## 2022-11-21 DIAGNOSIS — W19XXXD Unspecified fall, subsequent encounter: Secondary | ICD-10-CM | POA: Diagnosis not present

## 2022-11-21 DIAGNOSIS — K7460 Unspecified cirrhosis of liver: Secondary | ICD-10-CM | POA: Diagnosis not present

## 2022-11-21 DIAGNOSIS — I651 Occlusion and stenosis of basilar artery: Secondary | ICD-10-CM | POA: Diagnosis not present

## 2022-11-21 DIAGNOSIS — I10 Essential (primary) hypertension: Secondary | ICD-10-CM | POA: Diagnosis not present

## 2022-11-21 DIAGNOSIS — M6281 Muscle weakness (generalized): Secondary | ICD-10-CM | POA: Diagnosis not present

## 2022-11-28 ENCOUNTER — Other Ambulatory Visit: Payer: Self-pay

## 2022-11-28 ENCOUNTER — Other Ambulatory Visit: Payer: Self-pay | Admitting: Internal Medicine

## 2022-12-05 DIAGNOSIS — I7121 Aneurysm of the ascending aorta, without rupture: Secondary | ICD-10-CM | POA: Diagnosis not present

## 2022-12-05 DIAGNOSIS — E785 Hyperlipidemia, unspecified: Secondary | ICD-10-CM | POA: Diagnosis not present

## 2022-12-05 DIAGNOSIS — I10 Essential (primary) hypertension: Secondary | ICD-10-CM | POA: Diagnosis not present

## 2022-12-05 DIAGNOSIS — K7460 Unspecified cirrhosis of liver: Secondary | ICD-10-CM | POA: Diagnosis not present

## 2022-12-05 DIAGNOSIS — E039 Hypothyroidism, unspecified: Secondary | ICD-10-CM | POA: Diagnosis not present

## 2022-12-05 DIAGNOSIS — W19XXXD Unspecified fall, subsequent encounter: Secondary | ICD-10-CM | POA: Diagnosis not present

## 2022-12-05 DIAGNOSIS — M6281 Muscle weakness (generalized): Secondary | ICD-10-CM | POA: Diagnosis not present

## 2022-12-05 DIAGNOSIS — I69398 Other sequelae of cerebral infarction: Secondary | ICD-10-CM | POA: Diagnosis not present

## 2022-12-05 DIAGNOSIS — M1990 Unspecified osteoarthritis, unspecified site: Secondary | ICD-10-CM | POA: Diagnosis not present

## 2022-12-05 DIAGNOSIS — I651 Occlusion and stenosis of basilar artery: Secondary | ICD-10-CM | POA: Diagnosis not present

## 2022-12-08 ENCOUNTER — Encounter: Payer: Self-pay | Admitting: Internal Medicine

## 2022-12-08 DIAGNOSIS — R269 Unspecified abnormalities of gait and mobility: Secondary | ICD-10-CM

## 2022-12-08 DIAGNOSIS — Z8673 Personal history of transient ischemic attack (TIA), and cerebral infarction without residual deficits: Secondary | ICD-10-CM

## 2022-12-08 DIAGNOSIS — R911 Solitary pulmonary nodule: Secondary | ICD-10-CM

## 2022-12-08 DIAGNOSIS — R413 Other amnesia: Secondary | ICD-10-CM

## 2022-12-09 MED ORDER — HYDROCODONE BIT-HOMATROP MBR 5-1.5 MG/5ML PO SOLN: 5.0000 mL | Freq: Four times a day (QID) | ORAL | 0 refills | Status: AC | PRN

## 2022-12-09 NOTE — Telephone Encounter (Signed)
PCC's to see pt concern   thanks

## 2022-12-10 NOTE — Telephone Encounter (Signed)
Ok to let pt know that her requests falls under care that is personal services, so not covered by insurance, but can be obtained by calling the [facility] herself

## 2022-12-11 ENCOUNTER — Inpatient Hospital Stay: Payer: 59 | Admitting: Neurology

## 2022-12-11 DIAGNOSIS — H04123 Dry eye syndrome of bilateral lacrimal glands: Secondary | ICD-10-CM | POA: Diagnosis not present

## 2022-12-11 DIAGNOSIS — H353221 Exudative age-related macular degeneration, left eye, with active choroidal neovascularization: Secondary | ICD-10-CM | POA: Diagnosis not present

## 2022-12-11 DIAGNOSIS — H353112 Nonexudative age-related macular degeneration, right eye, intermediate dry stage: Secondary | ICD-10-CM | POA: Diagnosis not present

## 2022-12-11 DIAGNOSIS — I69898 Other sequelae of other cerebrovascular disease: Secondary | ICD-10-CM | POA: Diagnosis not present

## 2022-12-15 NOTE — Telephone Encounter (Signed)
Sorry, none that I know, thanks

## 2022-12-16 ENCOUNTER — Other Ambulatory Visit: Payer: Self-pay | Admitting: Internal Medicine

## 2022-12-22 ENCOUNTER — Telehealth: Payer: Self-pay | Admitting: Internal Medicine

## 2022-12-22 NOTE — Telephone Encounter (Signed)
Caller & What Company:   Charrise with [facility]  Phone Number:[phone]   Needs Verbal orders for what service & frequency:  Home Health PT 1 week 1

## 2022-12-23 NOTE — Telephone Encounter (Signed)
Called and gave verbals.

## 2022-12-23 NOTE — Telephone Encounter (Signed)
Ok for verbal 

## 2022-12-24 ENCOUNTER — Encounter (INDEPENDENT_AMBULATORY_CARE_PROVIDER_SITE_OTHER): Payer: 59 | Admitting: Ophthalmology

## 2022-12-31 ENCOUNTER — Telehealth: Payer: Self-pay | Admitting: Internal Medicine

## 2022-12-31 NOTE — Telephone Encounter (Signed)
Caller & What Company:  [facility]   Phone Number: [phone]   Needs Verbal orders for what service & frequency:  PT 1 week 1 starting 01/05/2023

## 2023-01-01 ENCOUNTER — Telehealth: Payer: Self-pay | Admitting: Internal Medicine

## 2023-01-01 NOTE — Telephone Encounter (Signed)
Selena Batten stated a fax was sent to them about this pt for physical therapy and nursing service. Pt is doing well with (physical therapy) but pt has dementia and Selena Batten mentioned there was no (indication) to what nursing was for. She was wondering if something can be sent to her via fax for indication on the request & why she needs services.. If you have any questions regarding this concern please reach out to [facility] @ [phone]-- [facility]   PLEASE ADVISE  THANKS.

## 2023-01-01 NOTE — Telephone Encounter (Signed)
Called and gave verbals.

## 2023-01-01 NOTE — Telephone Encounter (Signed)
Ok for verbal 

## 2023-01-01 NOTE — Telephone Encounter (Signed)
Ok to hold on RN please, thanks

## 2023-01-02 ENCOUNTER — Inpatient Hospital Stay: Admission: RE | Admit: 2023-01-02 | Payer: 59 | Source: Ambulatory Visit

## 2023-01-05 DIAGNOSIS — E785 Hyperlipidemia, unspecified: Secondary | ICD-10-CM | POA: Diagnosis not present

## 2023-01-05 DIAGNOSIS — M6281 Muscle weakness (generalized): Secondary | ICD-10-CM | POA: Diagnosis not present

## 2023-01-05 DIAGNOSIS — I7121 Aneurysm of the ascending aorta, without rupture: Secondary | ICD-10-CM | POA: Diagnosis not present

## 2023-01-05 DIAGNOSIS — I651 Occlusion and stenosis of basilar artery: Secondary | ICD-10-CM | POA: Diagnosis not present

## 2023-01-05 DIAGNOSIS — I69398 Other sequelae of cerebral infarction: Secondary | ICD-10-CM | POA: Diagnosis not present

## 2023-01-05 DIAGNOSIS — K7460 Unspecified cirrhosis of liver: Secondary | ICD-10-CM | POA: Diagnosis not present

## 2023-01-05 DIAGNOSIS — E039 Hypothyroidism, unspecified: Secondary | ICD-10-CM | POA: Diagnosis not present

## 2023-01-05 DIAGNOSIS — M1990 Unspecified osteoarthritis, unspecified site: Secondary | ICD-10-CM | POA: Diagnosis not present

## 2023-01-05 DIAGNOSIS — W19XXXD Unspecified fall, subsequent encounter: Secondary | ICD-10-CM | POA: Diagnosis not present

## 2023-01-05 DIAGNOSIS — I10 Essential (primary) hypertension: Secondary | ICD-10-CM | POA: Diagnosis not present

## 2023-01-06 ENCOUNTER — Telehealth: Payer: Self-pay | Admitting: Internal Medicine

## 2023-01-06 DIAGNOSIS — F419 Anxiety disorder, unspecified: Secondary | ICD-10-CM

## 2023-01-06 NOTE — Telephone Encounter (Signed)
Caller & What Company: Jenny Acosta with [facility]   Phone Number:[phone]     Needs Verbal orders for what service & frequency:  Home Health PT ;once a week for 6 week   Pt has anxiety and can't sleep at night and nurse wanted to know can she get set up with a counselor or someone to talk to to help her level go down if possible. Pt also wont move for NO scheduled appointments.Marland Kitchen  PLEASE ADVISE,  THANKS

## 2023-01-06 NOTE — Telephone Encounter (Signed)
Ok for verbal  Ok for referral psychology for anxiety counseling

## 2023-01-08 NOTE — Telephone Encounter (Signed)
Called and left voicemail giving verbals.

## 2023-01-09 ENCOUNTER — Other Ambulatory Visit: Payer: Self-pay | Admitting: Internal Medicine

## 2023-01-09 ENCOUNTER — Other Ambulatory Visit (INDEPENDENT_AMBULATORY_CARE_PROVIDER_SITE_OTHER): Payer: 59

## 2023-01-09 DIAGNOSIS — E559 Vitamin D deficiency, unspecified: Secondary | ICD-10-CM

## 2023-01-09 DIAGNOSIS — E7849 Other hyperlipidemia: Secondary | ICD-10-CM

## 2023-01-09 DIAGNOSIS — E538 Deficiency of other specified B group vitamins: Secondary | ICD-10-CM

## 2023-01-09 DIAGNOSIS — R7302 Impaired glucose tolerance (oral): Secondary | ICD-10-CM | POA: Diagnosis not present

## 2023-01-09 LAB — URINALYSIS, ROUTINE W REFLEX MICROSCOPIC
Bilirubin Urine: NEGATIVE
Hgb urine dipstick: NEGATIVE
Ketones, ur: NEGATIVE
Nitrite: NEGATIVE
Specific Gravity, Urine: 1.01 (ref 1.000–1.030)
Total Protein, Urine: NEGATIVE
Urine Glucose: NEGATIVE
Urobilinogen, UA: 0.2 (ref 0.0–1.0)
pH: 6.5 (ref 5.0–8.0)

## 2023-01-09 LAB — LIPID PANEL
Cholesterol: 260 mg/dL — ABNORMAL HIGH (ref 0–200)
HDL: 52 mg/dL (ref >39.00)
LDL Cholesterol: 167 mg/dL — ABNORMAL HIGH (ref 0–99)
NonHDL: 207.96
Total CHOL/HDL Ratio: 5
Triglycerides: 205 mg/dL — ABNORMAL HIGH (ref 0.0–149.0)
VLDL: 41 mg/dL — ABNORMAL HIGH (ref 0.0–40.0)

## 2023-01-09 LAB — CBC WITH DIFFERENTIAL/PLATELET
Basophils Absolute: 0 10*3/uL (ref 0.0–0.1)
Basophils Relative: 0.3 % (ref 0.0–3.0)
Eosinophils Absolute: 0.3 10*3/uL (ref 0.0–0.7)
Eosinophils Relative: 3.1 % (ref 0.0–5.0)
HCT: 46.8 % — ABNORMAL HIGH (ref 36.0–46.0)
Hemoglobin: 15.4 g/dL — ABNORMAL HIGH (ref 12.0–15.0)
Lymphocytes Relative: 32.3 % (ref 12.0–46.0)
Lymphs Abs: 2.9 10*3/uL (ref 0.7–4.0)
MCHC: 32.9 g/dL (ref 30.0–36.0)
MCV: 88.8 fl (ref 78.0–100.0)
Monocytes Absolute: 0.7 10*3/uL (ref 0.1–1.0)
Monocytes Relative: 7.3 % (ref 3.0–12.0)
Neutro Abs: 5.1 10*3/uL (ref 1.4–7.7)
Neutrophils Relative %: 57 % (ref 43.0–77.0)
Platelets: 253 10*3/uL (ref 150.0–400.0)
RBC: 5.27 Mil/uL — ABNORMAL HIGH (ref 3.87–5.11)
RDW: 15.3 % (ref 11.5–15.5)
WBC: 8.9 10*3/uL (ref 4.0–10.5)

## 2023-01-09 LAB — HEPATIC FUNCTION PANEL
ALT: 28 U/L (ref 0–35)
AST: 20 U/L (ref 0–37)
Albumin: 4.5 g/dL (ref 3.5–5.2)
Alkaline Phosphatase: 145 U/L — ABNORMAL HIGH (ref 39–117)
Bilirubin, Direct: 0.1 mg/dL (ref 0.0–0.3)
Total Bilirubin: 0.5 mg/dL (ref 0.2–1.2)
Total Protein: 7.9 g/dL (ref 6.0–8.3)

## 2023-01-09 LAB — MICROALBUMIN / CREATININE URINE RATIO
Creatinine,U: 43.6 mg/dL
Microalb Creat Ratio: 1.6 mg/g (ref 0.0–30.0)
Microalb, Ur: <0.7 mg/dL (ref 0.0–1.9)

## 2023-01-09 LAB — BASIC METABOLIC PANEL
BUN: 17 mg/dL (ref 6–23)
CO2: 31 meq/L (ref 19–32)
Calcium: 9.8 mg/dL (ref 8.4–10.5)
Chloride: 101 meq/L (ref 96–112)
Creatinine, Ser: 0.75 mg/dL (ref 0.40–1.20)
GFR: 76.58 mL/min (ref >60.00)
Glucose, Bld: 100 mg/dL — ABNORMAL HIGH (ref 70–99)
Potassium: 4.6 meq/L (ref 3.5–5.1)
Sodium: 139 meq/L (ref 135–145)

## 2023-01-09 LAB — TSH: TSH: 3.78 u[IU]/mL (ref 0.35–5.50)

## 2023-01-09 LAB — VITAMIN B12: Vitamin B-12: 966 pg/mL — ABNORMAL HIGH (ref 211–911)

## 2023-01-09 LAB — VITAMIN D 25 HYDROXY (VIT D DEFICIENCY, FRACTURES): VITD: 59.22 ng/mL (ref 30.00–100.00)

## 2023-01-09 LAB — HEMOGLOBIN A1C: Hgb A1c MFr Bld: 5.8 % (ref 4.6–6.5)

## 2023-01-09 MED ORDER — NEXLETOL 180 MG PO TABS: ORAL_TABLET | ORAL | 3 refills | Status: DC

## 2023-01-12 ENCOUNTER — Telehealth: Payer: Self-pay

## 2023-01-12 ENCOUNTER — Other Ambulatory Visit (HOSPITAL_COMMUNITY): Payer: Self-pay

## 2023-01-12 NOTE — Telephone Encounter (Signed)
Pharmacy Patient Advocate Encounter   Received notification from CoverMyMeds that prior authorization for Nexletol 180MG  tablets is required/requested.   Insurance verification completed.   The patient is insured through  [employer]  .   Per test claim: PA required; PA submitted to above mentioned insurance via CoverMyMeds Key/confirmation #/EOC Key: [id number]  Status is pending

## 2023-01-13 ENCOUNTER — Encounter: Payer: Self-pay | Admitting: Internal Medicine

## 2023-01-13 MED ORDER — ONDANSETRON 4 MG PO TBDP: 4.0000 mg | ORAL_TABLET | Freq: Three times a day (TID) | ORAL | 1 refills | Status: DC | PRN

## 2023-01-13 MED ORDER — MECLIZINE HCL 25 MG PO TABS: 25.0000 mg | ORAL_TABLET | Freq: Three times a day (TID) | ORAL | 2 refills | Status: DC | PRN

## 2023-01-13 MED ORDER — NEXLETOL 180 MG PO TABS: ORAL_TABLET | ORAL | 3 refills | Status: DC

## 2023-01-14 ENCOUNTER — Other Ambulatory Visit (HOSPITAL_COMMUNITY): Payer: Self-pay

## 2023-01-14 NOTE — Telephone Encounter (Signed)
Pharmacy Patient Advocate Encounter  Received notification from [facility] that Prior Authorization for NEXLETOL has been APPROVED from 11.18.24 to 5.18.25. Ran test claim, and the Rx has been filled and is payable again on/after 03/21/23.    This test claim was processed through [facility]- copay amounts may vary at other pharmacies due to pharmacy/plan contracts, or as the patient moves through the different stages of their insurance plan.   PA #/Case ID/Reference #: Key: [id number]

## 2023-01-20 DIAGNOSIS — K219 Gastro-esophageal reflux disease without esophagitis: Secondary | ICD-10-CM | POA: Diagnosis not present

## 2023-01-20 DIAGNOSIS — E785 Hyperlipidemia, unspecified: Secondary | ICD-10-CM | POA: Diagnosis not present

## 2023-01-20 DIAGNOSIS — M1990 Unspecified osteoarthritis, unspecified site: Secondary | ICD-10-CM | POA: Diagnosis not present

## 2023-01-20 DIAGNOSIS — I7121 Aneurysm of the ascending aorta, without rupture: Secondary | ICD-10-CM | POA: Diagnosis not present

## 2023-01-20 DIAGNOSIS — I69398 Other sequelae of cerebral infarction: Secondary | ICD-10-CM | POA: Diagnosis not present

## 2023-01-20 DIAGNOSIS — K7460 Unspecified cirrhosis of liver: Secondary | ICD-10-CM | POA: Diagnosis not present

## 2023-01-20 DIAGNOSIS — E039 Hypothyroidism, unspecified: Secondary | ICD-10-CM | POA: Diagnosis not present

## 2023-01-20 DIAGNOSIS — I10 Essential (primary) hypertension: Secondary | ICD-10-CM | POA: Diagnosis not present

## 2023-01-20 DIAGNOSIS — I651 Occlusion and stenosis of basilar artery: Secondary | ICD-10-CM | POA: Diagnosis not present

## 2023-01-20 DIAGNOSIS — M6281 Muscle weakness (generalized): Secondary | ICD-10-CM | POA: Diagnosis not present

## 2023-01-23 ENCOUNTER — Encounter: Payer: Self-pay | Admitting: Internal Medicine

## 2023-01-27 ENCOUNTER — Telehealth: Payer: Self-pay | Admitting: Internal Medicine

## 2023-01-27 NOTE — Telephone Encounter (Signed)
Charris from [facility] called for verbal orders to discharge patient from hh PT due to insurance not approving PT effective 01/26/23. Best callback is [phone]).

## 2023-01-28 NOTE — Telephone Encounter (Signed)
Called and gave verbals.

## 2023-01-28 NOTE — Telephone Encounter (Signed)
Ok noted  

## 2023-02-02 ENCOUNTER — Other Ambulatory Visit: Payer: 59

## 2023-02-06 NOTE — Telephone Encounter (Signed)
Hello mary -   Is this ok to do or would I need to do another referral?  thanks

## 2023-02-11 ENCOUNTER — Encounter (INDEPENDENT_AMBULATORY_CARE_PROVIDER_SITE_OTHER): Payer: Self-pay

## 2023-02-11 ENCOUNTER — Encounter (INDEPENDENT_AMBULATORY_CARE_PROVIDER_SITE_OTHER): Payer: 59 | Admitting: Ophthalmology

## 2023-02-15 ENCOUNTER — Encounter: Payer: Self-pay | Admitting: Internal Medicine

## 2023-02-16 ENCOUNTER — Other Ambulatory Visit: Payer: Self-pay | Admitting: Internal Medicine

## 2023-02-16 NOTE — Telephone Encounter (Signed)
[facility]

## 2023-02-18 ENCOUNTER — Other Ambulatory Visit: Payer: Self-pay | Admitting: Internal Medicine

## 2023-03-04 ENCOUNTER — Telehealth: Payer: Self-pay | Admitting: Neurology

## 2023-03-04 NOTE — Telephone Encounter (Signed)
 Pt's daughter called to r/s appt but no open slots showing. States they have been waiting on this appt for months and would like to be soon asap. Requesting call back [phone]

## 2023-03-05 NOTE — Telephone Encounter (Signed)
 LVM on pt's daughter phone to offer appt on 1/13 at 10 am with Dr. Marjory Lies. Slot is saved for pt

## 2023-03-05 NOTE — Telephone Encounter (Signed)
 Called the daughter once more. No answer. LVM.  Called the home number on file which went to the pt. Offered the apt time available and pt states that daughter would prefer late afternoon. She hated to not take it. Advised I would keep her on wait list and call if anything in afternoon opens up sooner. Placed apt on the books for march 6 4 pm. Will add to office wait list and my wait list.

## 2023-03-20 ENCOUNTER — Other Ambulatory Visit: Payer: Self-pay | Admitting: Internal Medicine

## 2023-03-23 ENCOUNTER — Encounter (INDEPENDENT_AMBULATORY_CARE_PROVIDER_SITE_OTHER): Payer: Self-pay

## 2023-03-23 ENCOUNTER — Encounter (INDEPENDENT_AMBULATORY_CARE_PROVIDER_SITE_OTHER): Payer: 59 | Admitting: Ophthalmology

## 2023-03-25 ENCOUNTER — Encounter: Payer: Self-pay | Admitting: Internal Medicine

## 2023-03-26 ENCOUNTER — Telehealth: Payer: Self-pay

## 2023-03-26 ENCOUNTER — Other Ambulatory Visit: Payer: Self-pay | Admitting: Internal Medicine

## 2023-03-27 ENCOUNTER — Telehealth: Payer: Self-pay

## 2023-03-27 ENCOUNTER — Other Ambulatory Visit (HOSPITAL_COMMUNITY): Payer: Self-pay

## 2023-03-27 NOTE — Telephone Encounter (Signed)
Pharmacy Patient Advocate Encounter   Received notification from Pt Calls Messages that prior authorization for Tramadol 50mg  is required/requested.   Insurance verification completed.   The patient is insured through  [employer]  .   Per test claim: Refill too soon. Placed a call to [facility] and per the pharmacist, Tramadol was filled and is ready to be picked up.

## 2023-03-27 NOTE — Telephone Encounter (Signed)
Very sorry, I am unable to answer as I do not have influence or knowledge about the Prior Authorization process, but hopefully they will address this today

## 2023-03-30 ENCOUNTER — Inpatient Hospital Stay: Payer: 59 | Admitting: Neurology

## 2023-04-07 NOTE — Telephone Encounter (Signed)
Jenny Acosta

## 2023-04-09 ENCOUNTER — Encounter: Payer: Self-pay | Admitting: Internal Medicine

## 2023-04-09 DIAGNOSIS — I77810 Thoracic aortic ectasia: Secondary | ICD-10-CM

## 2023-04-10 ENCOUNTER — Other Ambulatory Visit: Payer: 59

## 2023-04-24 ENCOUNTER — Other Ambulatory Visit: Payer: Self-pay | Admitting: Internal Medicine

## 2023-04-27 ENCOUNTER — Other Ambulatory Visit: Payer: Self-pay

## 2023-04-30 ENCOUNTER — Inpatient Hospital Stay: Payer: 59 | Admitting: Neurology

## 2023-05-07 ENCOUNTER — Ambulatory Visit
Admission: RE | Admit: 2023-05-07 | Discharge: 2023-05-07 | Disposition: A | Discharge status: Home / Self Care | Payer: 59 | Source: Ambulatory Visit | Attending: Internal Medicine | Admitting: Internal Medicine

## 2023-05-07 ENCOUNTER — Other Ambulatory Visit: Payer: Self-pay | Admitting: Internal Medicine

## 2023-05-07 ENCOUNTER — Other Ambulatory Visit: Payer: 59

## 2023-05-07 DIAGNOSIS — I7121 Aneurysm of the ascending aorta, without rupture: Secondary | ICD-10-CM | POA: Diagnosis not present

## 2023-05-07 DIAGNOSIS — I77810 Thoracic aortic ectasia: Secondary | ICD-10-CM

## 2023-05-07 DIAGNOSIS — R911 Solitary pulmonary nodule: Secondary | ICD-10-CM | POA: Diagnosis not present

## 2023-05-08 ENCOUNTER — Encounter: Payer: Self-pay | Admitting: Internal Medicine

## 2023-05-11 ENCOUNTER — Ambulatory Visit: Admitting: Internal Medicine

## 2023-05-20 ENCOUNTER — Ambulatory Visit (INDEPENDENT_AMBULATORY_CARE_PROVIDER_SITE_OTHER)

## 2023-05-20 ENCOUNTER — Encounter: Payer: Self-pay | Admitting: Internal Medicine

## 2023-05-20 ENCOUNTER — Ambulatory Visit (INDEPENDENT_AMBULATORY_CARE_PROVIDER_SITE_OTHER): Admitting: Internal Medicine

## 2023-05-20 VITALS — BP 136/80 | HR 94 | Temp 97.6°F | Ht 65.0 in

## 2023-05-20 DIAGNOSIS — R0989 Other specified symptoms and signs involving the circulatory and respiratory systems: Secondary | ICD-10-CM | POA: Diagnosis not present

## 2023-05-20 DIAGNOSIS — E7849 Other hyperlipidemia: Secondary | ICD-10-CM

## 2023-05-20 DIAGNOSIS — E039 Hypothyroidism, unspecified: Secondary | ICD-10-CM

## 2023-05-20 DIAGNOSIS — I10 Essential (primary) hypertension: Secondary | ICD-10-CM | POA: Diagnosis not present

## 2023-05-20 DIAGNOSIS — I7121 Aneurysm of the ascending aorta, without rupture: Secondary | ICD-10-CM

## 2023-05-20 DIAGNOSIS — R0781 Pleurodynia: Secondary | ICD-10-CM | POA: Insufficient documentation

## 2023-05-20 DIAGNOSIS — R300 Dysuria: Secondary | ICD-10-CM | POA: Diagnosis not present

## 2023-05-20 DIAGNOSIS — M1711 Unilateral primary osteoarthritis, right knee: Secondary | ICD-10-CM | POA: Diagnosis not present

## 2023-05-20 DIAGNOSIS — R7302 Impaired glucose tolerance (oral): Secondary | ICD-10-CM

## 2023-05-20 DIAGNOSIS — E538 Deficiency of other specified B group vitamins: Secondary | ICD-10-CM

## 2023-05-20 DIAGNOSIS — Z0001 Encounter for general adult medical examination with abnormal findings: Secondary | ICD-10-CM

## 2023-05-20 DIAGNOSIS — I77819 Aortic ectasia, unspecified site: Secondary | ICD-10-CM | POA: Diagnosis not present

## 2023-05-20 DIAGNOSIS — E559 Vitamin D deficiency, unspecified: Secondary | ICD-10-CM | POA: Diagnosis not present

## 2023-05-20 LAB — BASIC METABOLIC PANEL WITH GFR
BUN: 22 mg/dL (ref 6–23)
CO2: 32 meq/L (ref 19–32)
Calcium: 9.5 mg/dL (ref 8.4–10.5)
Chloride: 99 meq/L (ref 96–112)
Creatinine, Ser: 0.69 mg/dL (ref 0.40–1.20)
GFR: 83.27 mL/min (ref >60.00)
Glucose, Bld: 109 mg/dL — ABNORMAL HIGH (ref 70–99)
Potassium: 4 meq/L (ref 3.5–5.1)
Sodium: 137 meq/L (ref 135–145)

## 2023-05-20 LAB — CBC WITH DIFFERENTIAL/PLATELET
Basophils Absolute: 0 10*3/uL (ref 0.0–0.1)
Basophils Relative: 0.1 % (ref 0.0–3.0)
Eosinophils Absolute: 0.2 10*3/uL (ref 0.0–0.7)
Eosinophils Relative: 2.2 % (ref 0.0–5.0)
HCT: 44.1 % (ref 36.0–46.0)
Hemoglobin: 14.5 g/dL (ref 12.0–15.0)
Lymphocytes Relative: 36.8 % (ref 12.0–46.0)
Lymphs Abs: 3.4 10*3/uL (ref 0.7–4.0)
MCHC: 33 g/dL (ref 30.0–36.0)
MCV: 87.8 fl (ref 78.0–100.0)
Monocytes Absolute: 0.8 10*3/uL (ref 0.1–1.0)
Monocytes Relative: 8.6 % (ref 3.0–12.0)
Neutro Abs: 4.9 10*3/uL (ref 1.4–7.7)
Neutrophils Relative %: 52.3 % (ref 43.0–77.0)
Platelets: 259 10*3/uL (ref 150.0–400.0)
RBC: 5.02 Mil/uL (ref 3.87–5.11)
RDW: 14.7 % (ref 11.5–15.5)
WBC: 9.3 10*3/uL (ref 4.0–10.5)

## 2023-05-20 LAB — HEPATIC FUNCTION PANEL
ALT: 20 U/L (ref 0–35)
AST: 21 U/L (ref 0–37)
Albumin: 4.3 g/dL (ref 3.5–5.2)
Alkaline Phosphatase: 130 U/L — ABNORMAL HIGH (ref 39–117)
Bilirubin, Direct: 0.1 mg/dL (ref 0.0–0.3)
Total Bilirubin: 0.3 mg/dL (ref 0.2–1.2)
Total Protein: 7.7 g/dL (ref 6.0–8.3)

## 2023-05-20 LAB — LIPID PANEL
Cholesterol: 181 mg/dL (ref 0–200)
HDL: 57.5 mg/dL (ref >39.00)
LDL Cholesterol: 99 mg/dL (ref 0–99)
NonHDL: 123.84
Total CHOL/HDL Ratio: 3
Triglycerides: 125 mg/dL (ref 0.0–149.0)
VLDL: 25 mg/dL (ref 0.0–40.0)

## 2023-05-20 LAB — HEMOGLOBIN A1C: Hgb A1c MFr Bld: 5.8 % (ref 4.6–6.5)

## 2023-05-20 MED ORDER — CIPROFLOXACIN HCL 500 MG PO TABS
500.0000 mg | ORAL_TABLET | Freq: Two times a day (BID) | ORAL | 0 refills | Status: DC
Start: 2023-05-20 — End: 2023-05-26

## 2023-05-20 NOTE — Assessment & Plan Note (Signed)
 BP Readings from Last 3 Encounters:  05/20/23 136/80  11/11/22 134/81  11/04/22 128/76   Stable, pt to continue medical treatment - diet,wt control

## 2023-05-20 NOTE — Patient Instructions (Addendum)
 Please be sure to call Dr Garen Grams office for your next appointment  Please take all new medication as prescribed - the antibiotic  Please have your Shingrix (shingles) shots done at your local pharmacy.  Please continue all other medications as before, and refills have been done if requested.  Please have the pharmacy call with any other refills you may need.  Please continue your efforts at being more active, low cholesterol diet, and weight control.  You are otherwise up to date with prevention measures today.  Please keep your appointments with your specialists as you may have planned  You will be contacted regarding the referral for: orthopedic for the right knee  Please go to the XRAY Department in the first floor for the x-ray testing  Please go to the LAB at the blood drawing area for the tests to be done  You will be contacted by phone if any changes need to be made immediately.  Otherwise, you will receive a letter about your results with an explanation, but please check with MyChart first.  Please make an Appointment to return in 6 months, or sooner if needed

## 2023-05-20 NOTE — Assessment & Plan Note (Signed)
 Age and sex appropriate education and counseling updated with regular exercise and diet Referrals for preventative services - decliens dxa Immunizations addressed - for shingrix at pharmacy Smoking counseling  - none needed Evidence for depression or other mood disorder - likely depression but pt declines  Most recent labs reviewed. I have personally reviewed and have noted: 1) the patient's medical and social history 2) The patient's current medications and supplements 3) The patient's height, weight, and BMI have been recorded in the chart

## 2023-05-20 NOTE — Assessment & Plan Note (Signed)
 Lab Results  Component Value Date   TSH 3.78 01/09/2023   Stable, pt to continue levothyroxine 100 mcg every day, , f/u lab today

## 2023-05-20 NOTE — Assessment & Plan Note (Signed)
 Lab Results  Component Value Date   LDLCALC 167 (H) 01/09/2023   Pt has not been taking crestor,  pt for lower chol diet, declines other statin, for lab today and consider repatha; and continue zetia 10 qd

## 2023-05-20 NOTE — Assessment & Plan Note (Signed)
 Last vitamin D Lab Results  Component Value Date   VD25OH 59.22 01/09/2023   Stable, cont oral replacement

## 2023-05-20 NOTE — Assessment & Plan Note (Signed)
 Lab Results  Component Value Date   HGBA1C 5.8 01/09/2023   Stable, pt to continue current medical treatment  - diet, wt control

## 2023-05-20 NOTE — Assessment & Plan Note (Addendum)
 Worsening now with recent fall, for refer ortho for cortisone and / or gel shot, pt does not want surgury; may benefit from PT after coritsone

## 2023-05-20 NOTE — Assessment & Plan Note (Signed)
 I suspect rib fx - for cxr and left rib films

## 2023-05-20 NOTE — Progress Notes (Signed)
 Patient ID: Jenny Acosta, female   DOB: 05-03-1945, 78 y.o.   MRN: [id number]         Chief Complaint:: wellness exam and Fall (Had a fall and has been hurting on the left side since then when she is coughing and breathing it hurts the fall happened a week ago , says her balance isn't  back since having the stroke says the crestor made her sick has stopped taking it )  , left rib pain, hld, ascending aortic aneurysm, right knee arthritis       HPI:  Jenny Acosta is a 78 y.o. female here for wellness exam; for shingrix at pharmacy, declines dxa, o/w up to date                        Also fell over a hard chair arm with stumble at home now with 2 wks persistent moderate left lateral rib pain and bruising, worse to deep breaths and cough.  Has been waffling on f/u with [facility] Surg[facility] for her large ascending aortic aneurysm, wanting to know how severe this really is, last CT c/w 5.5 cm (worse over last 2 CT at 5.3 cm).  Also has worsening right knee arthritic pain and becoming more and more debilitated and mobility impaired in the home.  Appears defeated today though has not seen ortho for several years and not aware of possible tx such as cortisone, just does not want surgury.  Also incidentally has dysuria x 3 days   Wt Readings from Last 3 Encounters:  11/11/22 233 lb (105.7 kg)  11/04/22 235 lb (106.6 kg)  10/06/22 238 lb 3.2 oz (108 kg)   BP Readings from Last 3 Encounters:  05/20/23 136/80  11/11/22 134/81  11/04/22 128/76   Immunization History  Administered Date(s) Administered   DT (Pediatric) 08/11/2001   DTaP 08/24/2001   Influenza Split 02/27/2011   Influenza, High Dose Seasonal PF 01/06/2017, 10/27/2017   Influenza, Seasonal, Injecte, Preservative Fre 04/16/2009   Influenza,inj,Quad PF,6+ Mos 12/15/2013   Moderna Sars-Covid-2 Vaccination 11/11/2019, 12/09/2019   Pneumococcal Conjugate-13 07/21/2013   Pneumococcal Polysaccharide-23 08/30/2010   Tdap 01/06/2017   Zoster, Live  08/30/2007, 09/27/2008   Health Maintenance Due  Topic Date Due   Zoster Vaccines- Shingrix (1 of 2) 03/18/1964   DEXA SCAN  Never done   Medicare Annual Wellness (AWV)  05/28/2022      Past Medical History:  Diagnosis Date   Allergic rhinitis, cause unspecified    Arthritis    Carotid stenosis    Family history of colon cancer 07/21/2013   mother   Gallstone    GERD (gastroesophageal reflux disease)    Hyperlipidemia 08/30/2010   Hypertension    Hypothyroidism    Impaired glucose tolerance    Pain, coccyx    Right lumbar radiculopathy 08/30/2010   Past Surgical History:  Procedure Laterality Date   APPENDECTOMY  1953   CHOLECYSTECTOMY  2002   CHOLECYSTECTOMY OPEN  10 years ago   scarlet fever     age 9   TONSILLECTOMY  1959    reports that she has never smoked. She has never used smokeless tobacco. She reports that she does not drink alcohol and does not use drugs. family history includes Arthritis in her sister; Atrial fibrillation in her sister; Colon cancer in her mother; Diabetes in her daughter and maternal grandmother; Stroke in her mother. Allergies  Allergen Reactions   Azithromycin    Lisinopril  Cough   Losartan Other (See Comments)    Swelling of face   Methocarbamol Other (See Comments)    hallucinations   Simvastatin     Hair loss   Sulfa Antibiotics     unknown   Current Outpatient Medications on File Prior to Visit  Medication Sig Dispense Refill   acetaminophen (TYLENOL) 500 MG tablet Take 500 mg by mouth every 6 (six) hours as needed. For pain     albuterol (VENTOLIN HFA) 108 (90 Base) MCG/ACT inhaler Inhale 2 puffs into the lungs every 6 (six) hours as needed for wheezing or shortness of breath. 8.5 each 5   aspirin 81 MG chewable tablet Chew 1 tablet (81 mg total) by mouth daily.     Bempedoic Acid (NEXLETOL) 180 MG TABS 1 tab by mouth once daily 90 tablet 3   Cholecalciferol (THERA-D 2000) 50 MCG (2000 UT) TABS 1 tab by mouth epr day  (Patient taking differently: Take 1 tablet by mouth daily.) 30 tablet 99   clopidogrel (PLAVIX) 75 MG tablet TAKE 1 TABLET BY MOUTH EVERY DAY 90 tablet 1   DHA-EPA-Vitamin E (OMEGA-3 COMPLEX PO) Take by mouth.     ezetimibe (ZETIA) 10 MG tablet Take 1 tablet (10 mg total) by mouth daily. 90 tablet 3   levothyroxine (SYNTHROID) 100 MCG tablet Take 1 tablet (100 mcg total) by mouth daily before breakfast. 90 tablet 3   lidocaine (LIDODERM) 5 % Place 2 patches onto the skin daily. Remove & Discard patch within 12 hours or as directed by MD 30 patch 2   loratadine (CLARITIN) 10 MG tablet Take 1 tablet (10 mg total) by mouth daily. 30 tablet 0   meclizine (ANTIVERT) 25 MG tablet Take 1 tablet (25 mg total) by mouth 3 (three) times daily as needed for dizziness. 30 tablet 2   nortriptyline (PAMELOR) 10 MG capsule Take 3 capsules (30 mg total) by mouth at bedtime. 270 capsule 1   ondansetron (ZOFRAN-ODT) 4 MG disintegrating tablet Take 1 tablet (4 mg total) by mouth every 8 (eight) hours as needed for nausea or vomiting. 30 tablet 1   pantoprazole (PROTONIX) 40 MG tablet Take 1 tablet (40 mg total) by mouth daily. 90 tablet 2   PARoxetine (PAXIL) 30 MG tablet Take 1 tablet (30 mg total) by mouth daily. 90 tablet 2   polyethylene glycol (MIRALAX / GLYCOLAX) 17 g packet Take 17 g by mouth daily. 14 each 0   promethazine-dextromethorphan (PROMETHAZINE-DM) 6.25-15 MG/5ML syrup Take 5 mLs by mouth 4 (four) times daily as needed for cough. 118 mL 0   rosuvastatin (CRESTOR) 40 MG tablet Take 40 mg by mouth daily.     tiZANidine (ZANAFLEX) 2 MG tablet Take 1 tablet (2 mg total) by mouth every 8 (eight) hours as needed for muscle spasms. 30 tablet 0   traMADol (ULTRAM) 50 MG tablet TAKE 1 TABLET BY MOUTH EVERY 6 HOURS AS NEEDED 120 tablet 2   No current facility-administered medications on file prior to visit.        ROS:  All others reviewed and negative.  Objective        PE:  BP 136/80 (BP Location: Right  Arm, Patient Position: Sitting, Cuff Size: Normal)   Pulse 94   Temp 97.6 F (36.4 C) (Oral)   Ht 5\' 5"  (1.651 m)   SpO2 96%   BMI 38.77 kg/m                 Constitutional:  Pt appears in NAD               HENT: Head: NCAT.                Right Ear: External ear normal.                 Left Ear: External ear normal.                Eyes: . Pupils are equal, round, and reactive to light. Conjunctivae and EOM are normal               Nose: without d/c or deformity               Neck: Neck supple. Gross normal ROM               Cardiovascular: Normal rate and regular rhythm.                 Pulmonary/Chest: Effort normal and breath sounds without rales or wheezing. ; has marked point tenderness over left lateral side ribs and bruising               Abd:  Soft, mild low mid abd tender, ND, + BS, no organomegaly               Neurological: Pt is alert. At baseline orientation, motor grossly intact               Skin: Skin is warm. No rashes, no other new lesions, LE edema - trace RLE, right knee with severe OA degenerative change without effusion               Psychiatric: Pt behavior is normal without agitation   Micro: none  Cardiac tracings I have personally interpreted today:  none  Pertinent Radiological findings (summarize): none   Lab Results  Component Value Date   WBC 8.9 01/09/2023   HGB 15.4 (H) 01/09/2023   HCT 46.8 (H) 01/09/2023   PLT 253.0 01/09/2023   GLUCOSE 100 (H) 01/09/2023   CHOL 260 (H) 01/09/2023   TRIG 205.0 (H) 01/09/2023   HDL 52.00 01/09/2023   LDLDIRECT 121.0 06/17/2022   LDLCALC 167 (H) 01/09/2023   ALT 28 01/09/2023   AST 20 01/09/2023   NA 139 01/09/2023   K 4.6 01/09/2023   CL 101 01/09/2023   CREATININE 0.75 01/09/2023   BUN 17 01/09/2023   CO2 31 01/09/2023   TSH 3.78 01/09/2023   HGBA1C 5.8 01/09/2023   MICROALBUR <0.7 01/09/2023   Assessment/Plan:  Cionna Collantes is a 78 y.o. White or Caucasian [1] female with  has a past medical  history of Allergic rhinitis, cause unspecified, Arthritis, Carotid stenosis, Family history of colon cancer (07/21/2013), Gallstone, GERD (gastroesophageal reflux disease), Hyperlipidemia (08/30/2010), Hypertension, Hypothyroidism, Impaired glucose tolerance, Pain, coccyx, and Right lumbar radiculopathy (08/30/2010).  Encounter for well adult exam with abnormal findings Age and sex appropriate education and counseling updated with regular exercise and diet Referrals for preventative services - decliens dxa Immunizations addressed - for shingrix at pharmacy Smoking counseling  - none needed Evidence for depression or other mood disorder - likely depression but pt declines  Most recent labs reviewed. I have personally reviewed and have noted: 1) the patient's medical and social history 2) The patient's current medications and supplements 3) The patient's height, weight, and BMI have been recorded in the chart   Impaired glucose tolerance Lab Results  Component Value Date  HGBA1C 5.8 01/09/2023   Stable, pt to continue current medical treatment  - diet, wt control   Hypertension BP Readings from Last 3 Encounters:  05/20/23 136/80  11/11/22 134/81  11/04/22 128/76   Stable, pt to continue medical treatment - diet,wt control   Acquired hypothyroidism Lab Results  Component Value Date   TSH 3.78 01/09/2023   Stable, pt to continue levothyroxine 100 mcg every day, , f/u lab today    Hyperlipidemia Lab Results  Component Value Date   LDLCALC 167 (H) 01/09/2023   Pt has not been taking crestor,  pt for lower chol diet, declines other statin, for lab today and consider repatha; and continue zetia 10 qd   Vitamin D deficiency Last vitamin D Lab Results  Component Value Date   VD25OH 59.22 01/09/2023   Stable, cont oral replacement   Arthritis of right knee Worsening now with recent fall, for refer ortho for cortisone and / or gel shot, pt does not want surgury; may  benefit from PT after coritsone  Thoracic ascending aortic aneurysm (HCC) D/w pt the severe abnormal CT with 5.5 cm aneurysm, pt states she will call Dr Garen Grams office for f/u appt, decliens referral back there  Rib pain on left side I suspect rib fx - for cxr and left rib films  Followup: Return in about 6 months (around 11/20/2023).  Oliver Barre, MD 05/20/2023 8:50 PM [facility]

## 2023-05-20 NOTE — Assessment & Plan Note (Signed)
 D/w pt the severe abnormal CT with 5.5 cm aneurysm, pt states she will call Dr Garen Grams office for f/u appt, decliens referral back there

## 2023-05-21 ENCOUNTER — Encounter: Payer: Self-pay | Admitting: Internal Medicine

## 2023-05-21 LAB — VITAMIN D 25 HYDROXY (VIT D DEFICIENCY, FRACTURES): VITD: 62.25 ng/mL (ref 30.00–100.00)

## 2023-05-21 LAB — VITAMIN B12: Vitamin B-12: 671 pg/mL (ref 211–911)

## 2023-05-21 LAB — TSH: TSH: 6.18 u[IU]/mL — ABNORMAL HIGH (ref 0.35–5.50)

## 2023-05-21 NOTE — Progress Notes (Signed)
 The test results show that your current treatment is OK, as the tests are stable.  Please continue the same plan.  There is no other need for change of treatment or further evaluation based on these results, at this time.  thanks

## 2023-05-25 ENCOUNTER — Encounter: Payer: Self-pay | Admitting: Internal Medicine

## 2023-05-26 MED ORDER — NITROFURANTOIN MACROCRYSTAL 50 MG PO CAPS: 50.0000 mg | ORAL_CAPSULE | Freq: Four times a day (QID) | ORAL | 0 refills | Status: AC

## 2023-05-27 ENCOUNTER — Encounter: Payer: 59 | Admitting: Surgery

## 2023-05-28 ENCOUNTER — Telehealth: Payer: Self-pay | Admitting: Internal Medicine

## 2023-05-28 NOTE — Telephone Encounter (Signed)
 Copied from CRM [id number]. Topic: Clinical - Lab/Test Results >> May 28, 2023  1:42 PM Melissa C wrote: Reason for CRM: patient has not yet received results from imaging and was inquiring about results as she received a message from [facility] and didn't know what that was concerning (also sent message about this in MyChart). Patient's daughter was the one that sent these messages and called on her behalf. She asked that you call her back since patient is hard of hearing.

## 2023-05-28 NOTE — Telephone Encounter (Signed)
 I dont have any other information to relay to her about this, thanks

## 2023-05-28 NOTE — Telephone Encounter (Signed)
 This has been handled via Mychart

## 2023-05-30 ENCOUNTER — Encounter: Payer: Self-pay | Admitting: Internal Medicine

## 2023-06-01 ENCOUNTER — Other Ambulatory Visit: Payer: Self-pay

## 2023-06-01 ENCOUNTER — Other Ambulatory Visit: Payer: Self-pay | Admitting: Internal Medicine

## 2023-06-03 ENCOUNTER — Ambulatory Visit: Admitting: Internal Medicine

## 2023-06-04 ENCOUNTER — Ambulatory Visit: Admitting: Internal Medicine

## 2023-06-04 ENCOUNTER — Encounter: Payer: Self-pay | Admitting: Internal Medicine

## 2023-06-09 ENCOUNTER — Encounter: Payer: Self-pay | Admitting: Internal Medicine

## 2023-06-09 ENCOUNTER — Ambulatory Visit (INDEPENDENT_AMBULATORY_CARE_PROVIDER_SITE_OTHER): Admitting: Internal Medicine

## 2023-06-09 VITALS — BP 122/70 | HR 88 | Temp 98.6°F | Ht 65.0 in

## 2023-06-09 DIAGNOSIS — R0989 Other specified symptoms and signs involving the circulatory and respiratory systems: Secondary | ICD-10-CM

## 2023-06-09 DIAGNOSIS — E559 Vitamin D deficiency, unspecified: Secondary | ICD-10-CM

## 2023-06-09 DIAGNOSIS — I10 Essential (primary) hypertension: Secondary | ICD-10-CM | POA: Diagnosis not present

## 2023-06-09 DIAGNOSIS — R7302 Impaired glucose tolerance (oral): Secondary | ICD-10-CM

## 2023-06-09 MED ORDER — FUROSEMIDE 20 MG PO TABS: 20.0000 mg | ORAL_TABLET | Freq: Every day | ORAL | 11 refills | Status: DC

## 2023-06-09 NOTE — Progress Notes (Unsigned)
 Patient ID: Jenny Acosta, female   DOB: [birth year], 78 y.o.   MRN: [id number]        Chief Complaint: follow up recent abnormal cxr, htn,        HPI:  Jenny Acosta is a 78 y.o. female here with c/o 1 wk worsening sob doe with minor efforts it seems sometimes; Pt denies chest pain, wheezing, orthopnea, PND, increased LE swelling, palpitations, dizziness or syncope.   Pt denies polydipsia, polyuria, or new focal neuro s/s.    Pt denies fever, wt loss, night sweats, loss of appetite, or other constitutional symptoms  Pt declines wt today     Wt Readings from Last 3 Encounters:  11/11/22 233 lb (105.7 kg)  11/04/22 235 lb (106.6 kg)  10/06/22 238 lb 3.2 oz (108 kg)   BP Readings from Last 3 Encounters:  06/09/23 122/70  05/20/23 136/80  11/11/22 134/81         Past Medical History:  Diagnosis Date   Allergic rhinitis, cause unspecified    Arthritis    Carotid stenosis    Family history of colon cancer 07/21/2013   mother   Gallstone    GERD (gastroesophageal reflux disease)    Hyperlipidemia 08/30/2010   Hypertension    Hypothyroidism    Impaired glucose tolerance    Pain, coccyx    Right lumbar radiculopathy 08/30/2010   Past Surgical History:  Procedure Laterality Date   APPENDECTOMY  1953   CHOLECYSTECTOMY  2002   CHOLECYSTECTOMY OPEN  10 years ago   scarlet fever     age 27   TONSILLECTOMY  1959    reports that she has never smoked. She has never used smokeless tobacco. She reports that she does not drink alcohol and does not use drugs. family history includes Arthritis in her sister; Atrial fibrillation in her sister; Colon cancer in her mother; Diabetes in her daughter and maternal grandmother; Stroke in her mother. Allergies  Allergen Reactions   Azithromycin    Lisinopril     Cough   Losartan Other (See Comments)    Swelling of face   Methocarbamol Other (See Comments)    hallucinations   Simvastatin     Hair loss   Sulfa Antibiotics     unknown   Current  Outpatient Medications on File Prior to Visit  Medication Sig Dispense Refill   acetaminophen (TYLENOL) 500 MG tablet Take 500 mg by mouth every 6 (six) hours as needed. For pain     albuterol (VENTOLIN HFA) 108 (90 Base) MCG/ACT inhaler Inhale 2 puffs into the lungs every 6 (six) hours as needed for wheezing or shortness of breath. 8.5 each 5   aspirin 81 MG chewable tablet Chew 1 tablet (81 mg total) by mouth daily.     Cholecalciferol (THERA-D 2000) 50 MCG (2000 UT) TABS 1 tab by mouth epr day (Patient taking differently: Take 1 tablet by mouth daily.) 30 tablet 99   clopidogrel (PLAVIX) 75 MG tablet TAKE 1 TABLET BY MOUTH EVERY DAY 90 tablet 1   DHA-EPA-Vitamin E (OMEGA-3 COMPLEX PO) Take by mouth.     ezetimibe (ZETIA) 10 MG tablet Take 1 tablet (10 mg total) by mouth daily. 90 tablet 3   levothyroxine (SYNTHROID) 100 MCG tablet Take 1 tablet (100 mcg total) by mouth daily before breakfast. 90 tablet 3   loratadine (CLARITIN) 10 MG tablet Take 1 tablet (10 mg total) by mouth daily. 30 tablet 0   meclizine (ANTIVERT) 25 MG tablet TAKE  1 TABLET BY MOUTH 3 TIMES DAILY AS NEEDED FOR DIZZINESS. 30 tablet 2   nortriptyline (PAMELOR) 10 MG capsule Take 3 capsules (30 mg total) by mouth at bedtime. 270 capsule 1   ondansetron (ZOFRAN-ODT) 4 MG disintegrating tablet Take 1 tablet (4 mg total) by mouth every 8 (eight) hours as needed for nausea or vomiting. 30 tablet 1   pantoprazole (PROTONIX) 40 MG tablet Take 1 tablet (40 mg total) by mouth daily. 90 tablet 2   PARoxetine (PAXIL) 30 MG tablet Take 1 tablet (30 mg total) by mouth daily. 90 tablet 2   tiZANidine (ZANAFLEX) 2 MG tablet Take 1 tablet (2 mg total) by mouth every 8 (eight) hours as needed for muscle spasms. 30 tablet 0   traMADol (ULTRAM) 50 MG tablet TAKE 1 TABLET BY MOUTH EVERY 6 HOURS AS NEEDED 120 tablet 2   Bempedoic Acid (NEXLETOL) 180 MG TABS 1 tab by mouth once daily (Patient not taking: Reported on 06/09/2023) 90 tablet 3    lidocaine (LIDODERM) 5 % Place 2 patches onto the skin daily. Remove & Discard patch within 12 hours or as directed by MD (Patient not taking: Reported on 06/09/2023) 30 patch 2   polyethylene glycol (MIRALAX / GLYCOLAX) 17 g packet Take 17 g by mouth daily. (Patient not taking: Reported on 06/09/2023) 14 each 0   promethazine-dextromethorphan (PROMETHAZINE-DM) 6.25-15 MG/5ML syrup Take 5 mLs by mouth 4 (four) times daily as needed for cough. (Patient not taking: Reported on 06/09/2023) 118 mL 0   rosuvastatin (CRESTOR) 40 MG tablet Take 40 mg by mouth daily. (Patient not taking: Reported on 06/09/2023)     No current facility-administered medications on file prior to visit.        ROS:  All others reviewed and negative.  Objective        PE:  BP 122/70 (BP Location: Right Arm, Patient Position: Sitting, Cuff Size: Normal)   Pulse 88   Temp 98.6 F (37 C) (Oral)   Ht 5\' 5"  (1.651 m)   SpO2 94%   BMI 38.77 kg/m                 Constitutional: Pt appears in NAD               HENT: Head: NCAT.                Right Ear: External ear normal.                 Left Ear: External ear normal.                Eyes: . Pupils are equal, round, and reactive to light. Conjunctivae and EOM are normal               Nose: without d/c or deformity               Neck: Neck supple. Gross normal ROM               Cardiovascular: Normal rate and regular rhythm.                 Pulmonary/Chest: Effort normal and breath sounds without rales or wheezing.                Abd:  Soft, NT, ND, + BS, no organomegaly               Neurological: Pt is alert. At baseline orientation, motor grossly intact  Skin: Skin is warm. No rashes, no other new lesions, LE edema - trace bilateral               Psychiatric: Pt behavior is normal without agitation   Micro: none  Cardiac tracings I have personally interpreted today:  none  Pertinent Radiological findings (summarize): none   Lab Results  Component  Value Date   WBC 9.3 05/20/2023   HGB 14.5 05/20/2023   HCT 44.1 05/20/2023   PLT 259.0 05/20/2023   GLUCOSE 109 (H) 05/20/2023   CHOL 181 05/20/2023   TRIG 125.0 05/20/2023   HDL 57.50 05/20/2023   LDLDIRECT 121.0 06/17/2022   LDLCALC 99 05/20/2023   ALT 20 05/20/2023   AST 21 05/20/2023   NA 137 05/20/2023   K 4.0 05/20/2023   CL 99 05/20/2023   CREATININE 0.69 05/20/2023   BUN 22 05/20/2023   CO2 32 05/20/2023   TSH 6.18 (H) 05/20/2023   HGBA1C 5.8 05/20/2023   MICROALBUR <0.7 01/09/2023   Assessment/Plan:  Lashara Urey is a 78 y.o. White or Caucasian [1] female with  has a past medical history of Allergic rhinitis, cause unspecified, Arthritis, Carotid stenosis, Family history of colon cancer (07/21/2013), Gallstone, GERD (gastroesophageal reflux disease), Hyperlipidemia (08/30/2010), Hypertension, Hypothyroidism, Impaired glucose tolerance, Pain, coccyx, and Right lumbar radiculopathy (08/30/2010).  Hypertension BP Readings from Last 3 Encounters:  06/09/23 122/70  05/20/23 136/80  11/11/22 134/81   Stable, pt to continue medical treatment  - diet, wt control   Pulmonary congestion By recent cxr, likely mildly symptomatic mild volume overload, for change hct to lasix 20 mg every day and f/u lab in 2 wks  Impaired glucose tolerance Lab Results  Component Value Date   HGBA1C 5.8 05/20/2023   Stable, pt to continue current medical treatment  - diet, wt control   Vitamin D deficiency Last vitamin D Lab Results  Component Value Date   VD25OH 62.25 05/20/2023   Stable, cont oral replacement  Followup: Return in about 3 months (around 09/08/2023).  Rosalia Colonel, MD 06/10/2023 7:56 PM [facility]

## 2023-06-09 NOTE — Patient Instructions (Addendum)
 Please take all new medication as prescribed- the fluid pill  We will fill out the form you mentioned  Please continue all other medications as before, and refills have been done if requested.  Please have the pharmacy call with any other refills you may need.  Please keep your appointments with your specialists as you may have planned  Please go to the LAB at the blood drawing area for the tests to be done - in 2 weeks  You will be contacted by phone if any changes need to be made immediately.  Otherwise, you will receive a letter about your results with an explanation, but please check with MyChart first.  Please make an Appointment to return in 3 months, or sooner if needed, also with Lab Appointment for testing done 3-5 days before at the FIRST FLOOR Lab (so this is for TWO appointments - please see the scheduling desk as you leave)

## 2023-06-10 ENCOUNTER — Encounter: Payer: Self-pay | Admitting: Internal Medicine

## 2023-06-10 DIAGNOSIS — R0989 Other specified symptoms and signs involving the circulatory and respiratory systems: Secondary | ICD-10-CM | POA: Insufficient documentation

## 2023-06-10 NOTE — Assessment & Plan Note (Signed)
 BP Readings from Last 3 Encounters:  06/09/23 122/70  05/20/23 136/80  11/11/22 134/81   Stable, pt to continue medical treatment  - diet, wt control

## 2023-06-10 NOTE — Assessment & Plan Note (Signed)
 Lab Results  Component Value Date   HGBA1C 5.8 05/20/2023   Stable, pt to continue current medical treatment  - diet, wt control

## 2023-06-10 NOTE — Assessment & Plan Note (Signed)
 Last vitamin D Lab Results  Component Value Date   VD25OH 62.25 05/20/2023   Stable, cont oral replacement

## 2023-06-10 NOTE — Assessment & Plan Note (Signed)
 By recent cxr, likely mildly symptomatic mild volume overload, for change hct to lasix 20 mg every day and f/u lab in 2 wks

## 2023-06-19 ENCOUNTER — Telehealth: Payer: Self-pay

## 2023-06-19 ENCOUNTER — Other Ambulatory Visit (HOSPITAL_COMMUNITY): Payer: Self-pay

## 2023-06-19 NOTE — Telephone Encounter (Addendum)
 Pharmacy Patient Advocate Encounter   Received notification from CoverMyMeds that prior authorization for Nexletol  180MG  tablets  is due for renewal.   Insurance verification completed.   The patient is insured through [facility].  Action: PA required and submitted KEY/EOC/Request #:(Key: [id number] APPROVED Unable to obtain price due to refill too soon rejection, next available fill date 07/13/2023 PT APPROVAL IS FROM 02/25/2023 UNTIL 02/24/2024 NO PA IS NEEDED

## 2023-06-26 ENCOUNTER — Other Ambulatory Visit: Payer: Self-pay | Admitting: Internal Medicine

## 2023-06-26 ENCOUNTER — Other Ambulatory Visit: Payer: Self-pay

## 2023-06-30 ENCOUNTER — Encounter: Payer: Self-pay | Admitting: Internal Medicine

## 2023-06-30 MED ORDER — TORSEMIDE 20 MG PO TABS: 20.0000 mg | ORAL_TABLET | Freq: Every day | ORAL | 3 refills | Status: DC

## 2023-07-03 ENCOUNTER — Other Ambulatory Visit

## 2023-07-09 ENCOUNTER — Other Ambulatory Visit

## 2023-07-09 ENCOUNTER — Other Ambulatory Visit: Payer: Self-pay | Admitting: Internal Medicine

## 2023-07-10 ENCOUNTER — Other Ambulatory Visit: Payer: Self-pay

## 2023-07-15 ENCOUNTER — Other Ambulatory Visit: Payer: Self-pay | Admitting: Internal Medicine

## 2023-07-16 ENCOUNTER — Other Ambulatory Visit

## 2023-07-17 ENCOUNTER — Other Ambulatory Visit: Payer: Self-pay

## 2023-07-17 ENCOUNTER — Other Ambulatory Visit

## 2023-07-17 ENCOUNTER — Encounter: Payer: Self-pay | Admitting: Internal Medicine

## 2023-07-17 MED ORDER — TIZANIDINE HCL 2 MG PO TABS: 2.0000 mg | ORAL_TABLET | Freq: Three times a day (TID) | ORAL | 0 refills | Status: DC | PRN

## 2023-07-22 ENCOUNTER — Ambulatory Visit: Admitting: Internal Medicine

## 2023-07-22 ENCOUNTER — Inpatient Hospital Stay: Admitting: Neurology

## 2023-07-28 ENCOUNTER — Other Ambulatory Visit: Payer: Self-pay | Admitting: Internal Medicine

## 2023-07-29 ENCOUNTER — Other Ambulatory Visit

## 2023-07-29 ENCOUNTER — Ambulatory Visit: Admitting: Internal Medicine

## 2023-07-30 ENCOUNTER — Other Ambulatory Visit

## 2023-08-04 MED ORDER — TIZANIDINE HCL 2 MG PO TABS: 2.0000 mg | ORAL_TABLET | Freq: Three times a day (TID) | ORAL | 1 refills | Status: DC | PRN

## 2023-08-11 ENCOUNTER — Ambulatory Visit: Admitting: Internal Medicine

## 2023-08-14 ENCOUNTER — Other Ambulatory Visit: Payer: Self-pay

## 2023-08-14 ENCOUNTER — Other Ambulatory Visit: Payer: Self-pay | Admitting: Internal Medicine

## 2023-08-20 ENCOUNTER — Other Ambulatory Visit

## 2023-08-21 ENCOUNTER — Other Ambulatory Visit

## 2023-08-26 ENCOUNTER — Ambulatory Visit: Admitting: Internal Medicine

## 2023-08-26 ENCOUNTER — Other Ambulatory Visit: Payer: Self-pay | Admitting: Internal Medicine

## 2023-08-26 ENCOUNTER — Ambulatory Visit: Admitting: Surgery

## 2023-09-02 ENCOUNTER — Other Ambulatory Visit

## 2023-09-04 ENCOUNTER — Other Ambulatory Visit

## 2023-09-10 ENCOUNTER — Ambulatory Visit: Admitting: Internal Medicine

## 2023-09-17 ENCOUNTER — Other Ambulatory Visit: Payer: Self-pay | Admitting: Internal Medicine

## 2023-09-22 ENCOUNTER — Other Ambulatory Visit: Payer: Self-pay | Admitting: Internal Medicine

## 2023-09-22 ENCOUNTER — Ambulatory Visit: Admitting: Internal Medicine

## 2023-09-29 ENCOUNTER — Other Ambulatory Visit: Payer: Self-pay | Admitting: Internal Medicine

## 2023-10-04 ENCOUNTER — Encounter: Payer: Self-pay | Admitting: Internal Medicine

## 2023-10-04 DIAGNOSIS — R300 Dysuria: Secondary | ICD-10-CM

## 2023-10-04 DIAGNOSIS — R7989 Other specified abnormal findings of blood chemistry: Secondary | ICD-10-CM

## 2023-10-04 DIAGNOSIS — R7302 Impaired glucose tolerance (oral): Secondary | ICD-10-CM

## 2023-10-05 MED ORDER — TIZANIDINE HCL 4 MG PO TABS: 4.0000 mg | ORAL_TABLET | Freq: Four times a day (QID) | ORAL | 0 refills | Status: DC | PRN

## 2023-10-12 ENCOUNTER — Other Ambulatory Visit: Payer: Self-pay | Admitting: Internal Medicine

## 2023-10-13 ENCOUNTER — Ambulatory Visit: Payer: Self-pay

## 2023-10-13 NOTE — Telephone Encounter (Signed)
 Patient's daughter called regarding gum discomfort and swelling. States dentist is unable to get her in for an appt soon. Scheduled for next day appt as is unable to transport her today. Advised to call back if symptoms worsen or if she would like to try to get her in sooner.  Daughter would also like to discuss neuro referral, stating that the patient is having a lot of difficulty secondary to her CVA. This RN also recommended she speak to the provider to request resources as she mentioned that she is going to move her mother in with her soon and that she was unable to secure home health d/t insurance.  FYI Only or Action Required?: FYI only for provider.  Patient was last seen in primary care on 06/09/2023 by Norleen Lynwood ORN, MD.  Called Nurse Triage reporting Triage.  Symptoms began several days ago.  Interventions attempted: OTC medications: tylenol .  Symptoms are: gradually worsening.  Triage Disposition: See Today or Tomorrow in Office (overriding See PCP Within 2 Weeks)  Patient/caregiver understands and will follow disposition?: Yes  Reason for Disposition  All other mouth symptoms  (Exceptions: Dry mouth from not drinking enough liquids, chapped lips.)  Answer Assessment - Initial Assessment Questions 1. SYMPTOM: What's the main symptom you're concerned about? (e.g., chapped lips, dry mouth, lump, sores)     Oral/gum pain  2. ONSET:     5 days  3. PAIN: Is there any pain? If Yes, ask: How bad is it? (Scale: 0-10; or none, mild, moderate, severe)     Yes, moderate  4. CAUSE: What do you think is causing the symptoms?     Unknown  Protocols used: Mouth Symptoms-A-AH Copied from CRM [id number]. Topic: Clinical - Red Word Triage >> Oct 13, 2023 12:55 PM Pinkey ORN wrote: Red Word that prompted transfer to Nurse Triage: Dizziness >> Oct 13, 2023 12:57 PM Pinkey ORN wrote: Patient has been experiencing some dizziness as well as some concerns of possible upper  respiratory infection.

## 2023-10-14 ENCOUNTER — Encounter: Payer: Self-pay | Admitting: Internal Medicine

## 2023-10-14 ENCOUNTER — Ambulatory Visit: Admitting: Internal Medicine

## 2023-10-14 NOTE — Telephone Encounter (Signed)
 Copied from CRM [id number]. Topic: Clinical - Medication Question >> Oct 14, 2023  2:06 PM Deaijah H wrote: Reason for CRM: Patient's daughter called in wanting to know if an antibiotic may be sent in regarding some dental issue. Please call [phone]

## 2023-10-15 MED ORDER — AMOXICILLIN 500 MG PO CAPS: 500.0000 mg | ORAL_CAPSULE | Freq: Three times a day (TID) | ORAL | 0 refills | Status: AC

## 2023-10-16 ENCOUNTER — Ambulatory Visit: Admitting: Internal Medicine

## 2023-10-20 ENCOUNTER — Encounter: Payer: Self-pay | Admitting: Internal Medicine

## 2023-10-20 ENCOUNTER — Other Ambulatory Visit: Payer: Self-pay | Admitting: Internal Medicine

## 2023-10-23 ENCOUNTER — Other Ambulatory Visit: Payer: Self-pay | Admitting: Internal Medicine

## 2023-10-23 MED ORDER — TRAMADOL HCL 50 MG PO TABS: 50.0000 mg | ORAL_TABLET | Freq: Four times a day (QID) | ORAL | 2 refills | Status: DC | PRN

## 2023-10-23 NOTE — Telephone Encounter (Signed)
 Ok done erx but with controlled substances we normally prescribe 30 days only   thanks

## 2023-10-23 NOTE — Telephone Encounter (Unsigned)
 Copied from CRM [id number]. Topic: Clinical - Medication Refill >> Oct 23, 2023  3:32 PM Suzen RAMAN wrote: Medication: traMADol  (ULTRAM ) 50 MG tablet Would like a 90 days supply  Has the patient contacted their pharmacy? Yes   This is the patient's preferred pharmacy:  [id number] - [place], [place] - [address] AT [facility] [address] [place] [address] Phone: [phone] Fax: [phone]  Is this the correct pharmacy for this prescription? Yes If no, delete pharmacy and type the correct one.   Has the prescription been filled recently? No  Is the patient out of the medication? No  Has the patient been seen for an appointment in the last year OR does the patient have an upcoming appointment? Yes  Can we respond through MyChart? Yes  Agent: Please be advised that Rx refills may take up to 3 business days. We ask that you follow-up with your pharmacy.

## 2023-11-09 ENCOUNTER — Encounter: Payer: Self-pay | Admitting: Internal Medicine

## 2023-11-15 ENCOUNTER — Other Ambulatory Visit: Payer: Self-pay | Admitting: Internal Medicine

## 2023-11-24 ENCOUNTER — Ambulatory Visit: Admitting: Neurology

## 2023-11-28 ENCOUNTER — Other Ambulatory Visit: Payer: Self-pay | Admitting: Internal Medicine

## 2023-11-30 ENCOUNTER — Other Ambulatory Visit: Payer: Self-pay

## 2023-12-31 ENCOUNTER — Ambulatory Visit: Admitting: Internal Medicine

## 2024-01-28 ENCOUNTER — Encounter: Payer: Self-pay | Admitting: Internal Medicine

## 2024-01-28 ENCOUNTER — Ambulatory Visit: Admitting: Internal Medicine

## 2024-01-29 ENCOUNTER — Ambulatory Visit: Admitting: Internal Medicine

## 2024-02-11 ENCOUNTER — Other Ambulatory Visit: Payer: Self-pay | Admitting: Internal Medicine

## 2024-02-11 ENCOUNTER — Other Ambulatory Visit: Payer: Self-pay

## 2024-02-15 ENCOUNTER — Ambulatory Visit: Admitting: Internal Medicine

## 2024-02-16 ENCOUNTER — Other Ambulatory Visit: Payer: Self-pay | Admitting: Internal Medicine

## 2024-02-22 ENCOUNTER — Ambulatory Visit

## 2024-02-29 ENCOUNTER — Ambulatory Visit: Admitting: Internal Medicine

## 2024-03-05 ENCOUNTER — Other Ambulatory Visit: Payer: Self-pay | Admitting: Internal Medicine

## 2024-03-06 ENCOUNTER — Encounter (HOSPITAL_COMMUNITY): Payer: Self-pay

## 2024-03-06 ENCOUNTER — Encounter: Payer: Self-pay | Admitting: Internal Medicine

## 2024-03-06 ENCOUNTER — Emergency Department (HOSPITAL_COMMUNITY)

## 2024-03-06 ENCOUNTER — Other Ambulatory Visit: Payer: Self-pay

## 2024-03-06 ENCOUNTER — Emergency Department (HOSPITAL_COMMUNITY)
Admission: EM | Admit: 2024-03-06 | Discharge: 2024-03-06 | Disposition: A | Discharge status: Home / Self Care | Attending: Emergency Medicine | Admitting: Emergency Medicine

## 2024-03-06 DIAGNOSIS — W06XXXA Fall from bed, initial encounter: Secondary | ICD-10-CM | POA: Insufficient documentation

## 2024-03-06 DIAGNOSIS — Z7982 Long term (current) use of aspirin: Secondary | ICD-10-CM | POA: Insufficient documentation

## 2024-03-06 DIAGNOSIS — S0990XA Unspecified injury of head, initial encounter: Secondary | ICD-10-CM | POA: Diagnosis present

## 2024-03-06 DIAGNOSIS — Z7902 Long term (current) use of antithrombotics/antiplatelets: Secondary | ICD-10-CM | POA: Diagnosis not present

## 2024-03-06 DIAGNOSIS — S01111A Laceration without foreign body of right eyelid and periocular area, initial encounter: Secondary | ICD-10-CM | POA: Diagnosis not present

## 2024-03-06 DIAGNOSIS — S0181XA Laceration without foreign body of other part of head, initial encounter: Secondary | ICD-10-CM

## 2024-03-06 DIAGNOSIS — W19XXXA Unspecified fall, initial encounter: Secondary | ICD-10-CM

## 2024-03-06 MED ORDER — TRAMADOL HCL 50 MG PO TABS
50.0000 mg | ORAL_TABLET | Freq: Once | ORAL | Status: AC
  Administered 2024-03-06: 50 mg via ORAL
  Filled 2024-03-06: qty 1

## 2024-03-06 NOTE — ED Provider Notes (Signed)
 " [place] EMERGENCY DEPARTMENT AT [facility] Provider Note   CSN: [id number] Arrival date & time: 03/06/24  1127     Patient presents with: [place]   Jenny Acosta is a 79 y.o. female.   Patient is a 79 year old female with a history of some, prior stroke with chronic dizziness and some residual weakness who is presenting today as a level 2 fall.  Patient reports that she was sleeping and she rolled out of bed.  She thinks she had a piece of furniture on the way down when she landed on the laminate floor.  She complains of a headache and injury to her face but otherwise reports she has fibromyalgia and has pain everywhere and it feels about the same as usual.  She denies any visual changes.  No chest or abdominal pain at this time.  Patient does take Plavix  and took her dose yesterday.  She has not had any of her morning medications.  Reports her last tetanus shot is up-to-date  The history is provided by the patient, the EMS personnel and medical records.  Fall       Prior to Admission medications  Medication Sig Start Date End Date Taking? Authorizing Provider  acetaminophen  (TYLENOL ) 500 MG tablet Take 500 mg by mouth every 6 (six) hours as needed. For pain    [provider]  albuterol  (VENTOLIN  HFA) 108 (90 Base) MCG/ACT inhaler TAKE 2 PUFFS BY MOUTH EVERY 6 HOURS AS NEEDED FOR WHEEZE OR SHORTNESS OF BREATH 02/11/24   Norleen Lynwood ORN, MD  aspirin  81 MG chewable tablet Chew 1 tablet (81 mg total) by mouth daily. 08/28/22   Singh, Prashant K, MD  Bempedoic Acid  (NEXLETOL ) 180 MG TABS 1 tab by mouth once daily Patient not taking: Reported on 06/09/2023 01/13/23   Norleen Lynwood ORN, MD  Cholecalciferol (THERA-D 2000) 50 MCG (2000 UT) TABS 1 tab by mouth epr day Patient taking differently: Take 1 tablet by mouth daily. 02/27/20   Norleen Lynwood ORN, MD  clopidogrel  (PLAVIX ) 75 MG tablet TAKE 1 TABLET BY MOUTH EVERY DAY 11/30/23   Norleen Lynwood ORN, MD  DHA-EPA-Vitamin E (OMEGA-3 COMPLEX  PO) Take by mouth.    [provider]  ezetimibe  (ZETIA ) 10 MG tablet TAKE 1 TABLET BY MOUTH EVERY DAY 06/26/23   Norleen Lynwood ORN, MD  levothyroxine  (SYNTHROID ) 100 MCG tablet TAKE 1 TABLET BY MOUTH DAILY BEFORE BREAKFAST. 11/16/23   Norleen Lynwood ORN, MD  lidocaine  (LIDODERM ) 5 % Place 2 patches onto the skin daily. Remove & Discard patch within 12 hours or as directed by MD Patient not taking: Reported on 06/09/2023 11/04/22   Norleen Lynwood ORN, MD  loratadine  (CLARITIN ) 10 MG tablet Take 1 tablet (10 mg total) by mouth daily. 09/03/22   Angiulli, Toribio PARAS, PA-C  meclizine  (ANTIVERT ) 25 MG tablet TAKE 1 TABLET BY MOUTH 3 TIMES A DAY AS NEEDED FOR DIZZINESS 09/24/23   Norleen Lynwood ORN, MD  nortriptyline  (PAMELOR ) 10 MG capsule TAKE 3 CAPSULES (30 MG TOTAL) BY MOUTH AT BEDTIME. 09/18/23   Norleen Lynwood ORN, MD  ondansetron  (ZOFRAN -ODT) 4 MG disintegrating tablet TAKE 1 TABLET BY MOUTH EVERY 8 HOURS AS NEEDED FOR NAUSEA AND VOMITING 09/24/23   Norleen Lynwood ORN, MD  pantoprazole  (PROTONIX ) 40 MG tablet TAKE 1 TABLET BY MOUTH EVERY DAY 06/26/23   Norleen Lynwood ORN, MD  PARoxetine  (PAXIL ) 30 MG tablet TAKE 1 TABLET BY MOUTH EVERY DAY 10/21/23   Norleen Lynwood ORN, MD  polyethylene glycol (MIRALAX  / GLYCOLAX ) 17 g packet Take 17 g by mouth daily. Patient not taking: Reported on 06/09/2023 09/03/22   Angiulli, Toribio PARAS, PA-C  promethazine -dextromethorphan (PROMETHAZINE -DM) 6.25-15 MG/5ML syrup Take 5 mLs by mouth 4 (four) times daily as needed for cough. Patient not taking: Reported on 06/09/2023 11/07/22   Norleen Lynwood ORN, MD  rosuvastatin  (CRESTOR ) 40 MG tablet TAKE 1 TABLET BY MOUTH EVERY DAY 07/28/23   Norleen Lynwood ORN, MD  tiZANidine  (ZANAFLEX ) 4 MG tablet TAKE 1 TABLET BY MOUTH EVERY 6 HOURS AS NEEDED FOR MUSCLE SPASMS. 02/16/24   Norleen Lynwood ORN, MD  torsemide  (DEMADEX ) 20 MG tablet Take 1 tablet (20 mg total) by mouth daily. 06/30/23   Norleen Lynwood ORN, MD  traMADol  (ULTRAM ) 50 MG tablet Take 1 tablet (50 mg total) by mouth every 6 (six) hours  as needed. 10/23/23   Norleen Lynwood ORN, MD    Allergies: Azithromycin , Lisinopril, Losartan, Methocarbamol, Simvastatin , and Sulfa antibiotics    Review of Systems  Updated Vital Signs BP (!) 149/65   Pulse 97   Temp (!) 97.5 F (36.4 C) (Temporal)   Resp 20   SpO2 100%   Physical Exam Vitals and nursing note reviewed.  Constitutional:      General: She is not in acute distress.    Appearance: She is well-developed.  HENT:     Head: Normocephalic and atraumatic.      Comments: 3 cm laceration over the right eyebrow Eyes:     Pupils: Pupils are equal, round, and reactive to light.  Neck:     Comments: C-collar is in place Cardiovascular:     Rate and Rhythm: Normal rate and regular rhythm.     Heart sounds: Normal heart sounds. No murmur heard.    No friction rub.  Pulmonary:     Effort: Pulmonary effort is normal.     Breath sounds: Normal breath sounds. No wheezing or rales.  Abdominal:     General: Bowel sounds are normal. There is no distension.     Palpations: Abdomen is soft.     Tenderness: There is no abdominal tenderness. There is no guarding or rebound.  Musculoskeletal:        General: No tenderness. Normal range of motion.     Comments: No edema patient is able to move her upper and lower extremities without significant pain.  Does complain of lower back pain but no significant point tenderness  Skin:    General: Skin is warm and dry.     Findings: No rash.  Neurological:     Mental Status: She is alert and oriented to person, place, and time. Mental status is at baseline.     Cranial Nerves: No cranial nerve deficit.     Sensory: No sensory deficit.     Motor: No weakness.  Psychiatric:        Behavior: Behavior normal.     (all labs ordered are listed, but only abnormal results are displayed) Labs Reviewed - No data to display  EKG: None  Radiology: CT MAXILLOFACIAL WO CONTRAST Result Date: 03/06/2024 EXAM: CT OF THE FACE WITHOUT CONTRAST  03/06/2024 12:01:57 PM TECHNIQUE: CT of the face was performed without the administration of intravenous contrast. Multiplanar reformatted images are provided for review. Automated exposure control, iterative reconstruction, and/or weight based adjustment of the mA/kV was utilized to reduce the radiation dose to as low as reasonably achievable. COMPARISON: CTA head and neck 08/22/2022 CLINICAL HISTORY: Facial trauma, blunt  FINDINGS: FACIAL BONES: No acute facial fracture. No mandibular dislocation. No suspicious bone lesion. Dental caries. ORBITS: No acute traumatic injury. No inflammatory change. Bilateral cataract extraction. SINUSES AND MASTOIDS: Mild scattered mucosal thickening in the paranasal sinuses. Minimal fluid in the right sphenoid sinus. Clear mastoid air cells and middle ear cavities. SOFT TISSUES: Right forehead scalp soft tissue swelling and laceration. IMPRESSION: 1. No facial fracture. 2. Right forehead scalp soft tissue swelling and laceration. Electronically signed by: Dasie Hamburg MD MD 03/06/2024 12:19 PM EST RP Workstation: [id number]   CT CERVICAL SPINE WO CONTRAST Result Date: 03/06/2024 EXAM: CT CERVICAL SPINE WITHOUT CONTRAST 03/06/2024 12:01:57 PM TECHNIQUE: CT of the cervical spine was performed without the administration of intravenous contrast. Multiplanar reformatted images are provided for review. Automated exposure control, iterative reconstruction, and/or weight based adjustment of the mA/kV was utilized to reduce the radiation dose to as low as reasonably achievable. COMPARISON: CT cervical spine 08/21/2022. CLINICAL HISTORY: Polytrauma, blunt. FINDINGS: BONES AND ALIGNMENT: Hemangioma in the T2 vertebral body. No acute fracture or traumatic malalignment. DEGENERATIVE CHANGES: Widespread advanced disc degeneration throughout the cervical and included upper thoracic spine with relative sparing of C2-C3. Interbody facet ankylosis at C3-C4. Moderately advanced facet arthrosis in  the mid to upper cervical spine. No evidence of high grade spinal canal stenosis. SOFT TISSUES: No prevertebral soft tissue swelling. IMPRESSION: 1. No acute cervical spine fracture or traumatic malalignment. Electronically signed by: Dasie Hamburg MD MD 03/06/2024 12:17 PM EST RP Workstation: [id number]   CT HEAD WO CONTRAST Result Date: 03/06/2024 EXAM: CT HEAD WITHOUT CONTRAST 03/06/2024 12:01:57 PM TECHNIQUE: CT of the head was performed without the administration of intravenous contrast. Automated exposure control, iterative reconstruction, and/or weight based adjustment of the mA/kV was utilized to reduce the radiation dose to as low as reasonably achievable. COMPARISON: CTA head and neck 08/22/2022. MRI head 08/22/2022. CLINICAL HISTORY: Head trauma, moderate-severe. FINDINGS: BRAIN AND VENTRICLES: There is no evidence of an acute infarct, intracranial hemorrhage, mass, midline shift, hydrocephalus, or extra-axial fluid collection. Small to moderate sized chronic posterior right PCA infarct is again noted. Patchy and confluent hypodensities in the cerebral white matter bilaterally are nonspecific but compatible with extensive chronic small vessel ischemic disease. Chronic lacunar infarcts are present in the deep gray nuclei bilaterally as well as in the right cerebellar hemisphere. There is mild to moderate cerebral atrophy. Calcified atherosclerosis at the skull base. ORBITS: Reported on today's separate maxillofacial CT. SINUSES: Reported on today's separate maxillofacial CT. SOFT TISSUES AND SKULL: Right forehead scalp soft tissue swelling and laceration. No skull fracture. IMPRESSION: 1. No acute intracranial abnormality. 2. Right forehead scalp soft tissue swelling and laceration. 3. Extensive chronic small vessel ischemic disease with chronic infarcts as above. Electronically signed by: Dasie Hamburg MD MD 03/06/2024 12:14 PM EST RP Workstation: [id number]     Procedures  LACERATION  REPAIR Performed by: [facility] Authorized by: Benton Shone Consent: Verbal consent obtained. Risks and benefits: risks, benefits and alternatives were discussed Consent given by: patient Patient identity confirmed: provided demographic data Prepped and Draped in normal sterile fashion Wound explored  Laceration Location: right forehead  Laceration Length: 3cm  No Foreign Bodies seen or palpated  Anesthesia: local infiltration  Local anesthetic: lidocaine  2% with epinephrine  Anesthetic total: 4 ml  Irrigation method: syringe Amount of cleaning: standard  Skin closure: 6.0 prolene  Number of sutures: 6  Technique: simple interrupted  Patient tolerance: Patient tolerated the procedure well with no immediate complications.  Medications Ordered  in the ED  traMADol  (ULTRAM ) tablet 50 mg (50 mg Oral Given 03/06/24 1221)                                    Medical Decision Making Amount and/or Complexity of Data Reviewed Radiology: ordered and independent interpretation performed. Decision-making details documented in ED Course.  Risk Prescription drug management.   Pt with multiple medical problems and comorbidities and presenting today with a complaint that caries a high risk for morbidity and mortality.  Here today after a fall and head injury.  She was sleeping and rolled out of the bed.  Otherwise reported yesterday was a normal day.  She is complaining of a headache but is neurologically intact at this time.  Tetanus shot is up-to-date.  Wound was repaired as above.  Imaging is pending.  Low suspicion for an underlying medical cause as the reason for her fall. I have independently visualized and interpreted pt's images today.  CT of the head was negative for intracranial injury and no fractures in the cervical spine.  Radiology reports no acute abnormalities but extensive chronic small vessel ischemia with chronic infarcts and right frontal hematoma.  All this  was discussed with the patient.  She had self removed the collar but has no significant tenderness and has range of motion.  At this time patient is stable for discharge home.       Final diagnoses:  Fall, initial encounter  Facial laceration, initial encounter    ED Discharge Orders     None          Doretha Folks, MD 03/06/24 1227  "

## 2024-03-06 NOTE — ED Notes (Signed)
 Patient's daughter contacted and updated. Daughter report's being on the way.

## 2024-03-06 NOTE — ED Triage Notes (Signed)
 Patient BIB [facility] from home after rolling out of bed to the floor, patient believes she hit a piece of furniture on the way down. Patient is A&Ox4, GCS 15, no LOC, on plavix , level 2 activation, 2in laceration to R forehead above eyebrow. EMS reports no other obvious injuries, patient has no other complaints.  BP 146/84 HR 94 R 18 98% RA CBG 120

## 2024-03-06 NOTE — Progress Notes (Signed)
 Orthopedic Tech Progress Note Patient Details:  Jenny Acosta 1945/04/11 [id number]  Patient ID: Jenny Acosta, female   DOB: [birth year], 79 y.o.   MRN: [id number] Responded to level 2 trauma, ortho techs currently not needed.  Jenny Acosta 03/06/2024, 11:37 AM

## 2024-03-06 NOTE — ED Notes (Signed)
 Patient agitated and demanding to take C-collar off. C-collar removed by patient and refused to put it back on.

## 2024-03-06 NOTE — Discharge Instructions (Addendum)
 All the scans today were okay.  No evidence of new stroke or bleeding.  No broken bones.  You are going to be sore all over from the fall and you can continue to take your tramadol  at home.  If you start having confusion, persistent vomiting, shortness of breath or other concerns return to the ER.

## 2024-03-06 NOTE — ED Notes (Signed)
Patient transported to CT by TRN ?

## 2024-03-07 ENCOUNTER — Other Ambulatory Visit: Payer: Self-pay

## 2024-03-08 ENCOUNTER — Other Ambulatory Visit: Payer: Self-pay | Admitting: Internal Medicine

## 2024-03-15 ENCOUNTER — Ambulatory Visit: Admitting: Internal Medicine

## 2024-03-15 ENCOUNTER — Encounter: Payer: Self-pay | Admitting: Internal Medicine

## 2024-03-15 ENCOUNTER — Ambulatory Visit: Payer: Self-pay | Admitting: Internal Medicine

## 2024-03-15 VITALS — BP 122/78 | HR 97 | Temp 97.7°F | Ht 65.0 in | Wt 232.0 lb

## 2024-03-15 DIAGNOSIS — E7849 Other hyperlipidemia: Secondary | ICD-10-CM

## 2024-03-15 DIAGNOSIS — S0181XA Laceration without foreign body of other part of head, initial encounter: Secondary | ICD-10-CM | POA: Diagnosis not present

## 2024-03-15 DIAGNOSIS — R1312 Dysphagia, oropharyngeal phase: Secondary | ICD-10-CM

## 2024-03-15 DIAGNOSIS — Z0000 Encounter for general adult medical examination without abnormal findings: Secondary | ICD-10-CM

## 2024-03-15 DIAGNOSIS — I7121 Aneurysm of the ascending aorta, without rupture: Secondary | ICD-10-CM

## 2024-03-15 DIAGNOSIS — R911 Solitary pulmonary nodule: Secondary | ICD-10-CM | POA: Diagnosis not present

## 2024-03-15 DIAGNOSIS — Z0001 Encounter for general adult medical examination with abnormal findings: Secondary | ICD-10-CM

## 2024-03-15 DIAGNOSIS — R7302 Impaired glucose tolerance (oral): Secondary | ICD-10-CM

## 2024-03-15 DIAGNOSIS — Z23 Encounter for immunization: Secondary | ICD-10-CM | POA: Diagnosis not present

## 2024-03-15 DIAGNOSIS — Z01419 Encounter for gynecological examination (general) (routine) without abnormal findings: Secondary | ICD-10-CM

## 2024-03-15 DIAGNOSIS — Z8673 Personal history of transient ischemic attack (TIA), and cerebral infarction without residual deficits: Secondary | ICD-10-CM

## 2024-03-15 DIAGNOSIS — S0181XD Laceration without foreign body of other part of head, subsequent encounter: Secondary | ICD-10-CM

## 2024-03-15 DIAGNOSIS — I77810 Thoracic aortic ectasia: Secondary | ICD-10-CM

## 2024-03-15 DIAGNOSIS — I10 Essential (primary) hypertension: Secondary | ICD-10-CM

## 2024-03-15 LAB — URINALYSIS, ROUTINE W REFLEX MICROSCOPIC
Bilirubin Urine: NEGATIVE
Hgb urine dipstick: NEGATIVE
Nitrite: NEGATIVE
Specific Gravity, Urine: >=1.03 — AB (ref 1.000–1.030)
Total Protein, Urine: NEGATIVE
Urine Glucose: NEGATIVE
Urobilinogen, UA: 0.2 (ref 0.0–1.0)
pH: 5.5 (ref 5.0–8.0)

## 2024-03-15 LAB — LIPID PANEL
Cholesterol: 220 mg/dL — ABNORMAL HIGH (ref 28–200)
HDL: 52.7 mg/dL
LDL Cholesterol: 132 mg/dL — ABNORMAL HIGH (ref 10–99)
NonHDL: 167.72
Total CHOL/HDL Ratio: 4
Triglycerides: 178 mg/dL — ABNORMAL HIGH (ref 10.0–149.0)
VLDL: 35.6 mg/dL (ref 0.0–40.0)

## 2024-03-15 LAB — CBC WITH DIFFERENTIAL/PLATELET
Basophils Absolute: 0 K/uL (ref 0.0–0.1)
Basophils Relative: 0.2 % (ref 0.0–3.0)
Eosinophils Absolute: 0.1 K/uL (ref 0.0–0.7)
Eosinophils Relative: 1.3 % (ref 0.0–5.0)
HCT: 44.4 % (ref 36.0–46.0)
Hemoglobin: 14.9 g/dL (ref 12.0–15.0)
Lymphocytes Relative: 38.1 % (ref 12.0–46.0)
Lymphs Abs: 3.1 K/uL (ref 0.7–4.0)
MCHC: 33.5 g/dL (ref 30.0–36.0)
MCV: 86.8 fl (ref 78.0–100.0)
Monocytes Absolute: 0.6 K/uL (ref 0.1–1.0)
Monocytes Relative: 8 % (ref 3.0–12.0)
Neutro Abs: 4.2 K/uL (ref 1.4–7.7)
Neutrophils Relative %: 52.4 % (ref 43.0–77.0)
Platelets: 237 K/uL (ref 150.0–400.0)
RBC: 5.12 Mil/uL — ABNORMAL HIGH (ref 3.87–5.11)
RDW: 14.5 % (ref 11.5–15.5)
WBC: 8 K/uL (ref 4.0–10.5)

## 2024-03-15 LAB — BASIC METABOLIC PANEL WITH GFR
BUN: 26 mg/dL — ABNORMAL HIGH (ref 6–23)
CO2: 27 meq/L (ref 19–32)
Calcium: 9.3 mg/dL (ref 8.4–10.5)
Chloride: 102 meq/L (ref 96–112)
Creatinine, Ser: 0.62 mg/dL (ref 0.40–1.20)
GFR: 84.95 mL/min
Glucose, Bld: 101 mg/dL — ABNORMAL HIGH (ref 70–99)
Potassium: 4.2 meq/L (ref 3.5–5.1)
Sodium: 138 meq/L (ref 135–145)

## 2024-03-15 LAB — HEPATIC FUNCTION PANEL
ALT: 21 U/L (ref 3–35)
AST: 18 U/L (ref 5–37)
Albumin: 4.1 g/dL (ref 3.5–5.2)
Alkaline Phosphatase: 118 U/L — ABNORMAL HIGH (ref 39–117)
Bilirubin, Direct: 0.1 mg/dL (ref 0.1–0.3)
Total Bilirubin: 0.4 mg/dL (ref 0.2–1.2)
Total Protein: 7.5 g/dL (ref 6.0–8.3)

## 2024-03-15 LAB — HEMOGLOBIN A1C: Hgb A1c MFr Bld: 5.7 % (ref 4.6–6.5)

## 2024-03-15 LAB — TSH: TSH: 3.5 u[IU]/mL (ref 0.35–5.50)

## 2024-03-15 MED ORDER — ROSUVASTATIN CALCIUM 40 MG PO TABS: 40.0000 mg | ORAL_TABLET | Freq: Every day | ORAL | 2 refills | Status: AC

## 2024-03-15 MED ORDER — EZETIMIBE 10 MG PO TABS: 10.0000 mg | ORAL_TABLET | Freq: Every day | ORAL | 2 refills | Status: AC

## 2024-03-15 MED ORDER — ALBUTEROL SULFATE HFA 108 (90 BASE) MCG/ACT IN AERS: 2.0000 | INHALATION_SPRAY | Freq: Four times a day (QID) | RESPIRATORY_TRACT | 5 refills | Status: AC | PRN

## 2024-03-15 MED ORDER — PANTOPRAZOLE SODIUM 40 MG PO TBEC: 40.0000 mg | DELAYED_RELEASE_TABLET | Freq: Every day | ORAL | 3 refills | Status: AC

## 2024-03-15 MED ORDER — PAROXETINE HCL 30 MG PO TABS: 30.0000 mg | ORAL_TABLET | Freq: Every day | ORAL | 2 refills | Status: AC

## 2024-03-15 MED ORDER — CLOPIDOGREL BISULFATE 75 MG PO TABS: 75.0000 mg | ORAL_TABLET | Freq: Every day | ORAL | 2 refills | Status: AC

## 2024-03-15 MED ORDER — LEVOTHYROXINE SODIUM 100 MCG PO TABS: 100.0000 ug | ORAL_TABLET | Freq: Every day | ORAL | 2 refills | Status: AC

## 2024-03-15 MED ORDER — TRAMADOL HCL 50 MG PO TABS: 50.0000 mg | ORAL_TABLET | Freq: Four times a day (QID) | ORAL | 2 refills | Status: AC | PRN

## 2024-03-15 MED ORDER — NORTRIPTYLINE HCL 10 MG PO CAPS: 30.0000 mg | ORAL_CAPSULE | Freq: Every day | ORAL | 1 refills | Status: AC

## 2024-03-15 MED ORDER — TORSEMIDE 20 MG PO TABS: 20.0000 mg | ORAL_TABLET | Freq: Every day | ORAL | 3 refills | Status: AC

## 2024-03-15 NOTE — Assessment & Plan Note (Signed)
 For CTA aortic, but also refer pulmonary with hx of aspirate pna

## 2024-03-15 NOTE — Progress Notes (Signed)
 Patient ID: Jenny Acosta, female   DOB: [birth year], 79 y.o.   MRN: [id number]         Chief Complaint:: wellness exam and Medical Management of Chronic Issues (Referrals, neurology , GYN, Pulmonary )  , ascending aortic dilation, right forehead laceration, dysphagia with increased aspiration risk, hx of stroke       HPI:  Jenny Acosta is a 79 y.o. female here for wellness exam; for flu shot, due for GYN yearly exam, o/w up to date                        Also had an unfortunate roll and falling OOB while sleeping with large right periorbital echymosis, seen in ED with several stitches placed to approx 1 cm right forehead laceration, needs stitches out today.  Pt denies chest pain, increased sob or doe, wheezing, orthopnea, PND, increased LE swelling, palpitations, dizziness or syncope.   Pt denies polydipsia, polyuria, or new focal neuro s/s.   Due for CTA aortic to f/u ascending aortic dilation.  Has hx of aspirate pna, and now with ongoing dysphagia with solids.     Wt Readings from Last 3 Encounters:  03/15/24 232 lb (105.2 kg)  11/11/22 233 lb (105.7 kg)  11/04/22 235 lb (106.6 kg)   BP Readings from Last 3 Encounters:  03/15/24 122/78  03/06/24 (!) 149/65  06/09/23 122/70   Immunization History  Administered Date(s) Administered   DT (Pediatric) 08/11/2001   DTaP 08/24/2001   INFLUENZA, HIGH DOSE SEASONAL PF 01/06/2017, 10/27/2017, 03/15/2024   Influenza Split 02/27/2011   Influenza, Seasonal, Injecte, Preservative Fre 04/16/2009   Influenza,inj,Quad PF,6+ Mos 12/15/2013   Moderna Sars-Covid-2 Vaccination 11/11/2019, 12/09/2019   Pneumococcal Conjugate-13 07/21/2013   Pneumococcal Polysaccharide-23 08/30/2010   Tdap 01/06/2017   Zoster, Live 08/30/2007, 09/27/2008   Health Maintenance Due  Topic Date Due   Zoster Vaccines- Shingrix (1 of 2) 03/18/1964   Bone Density Scan  Never done   Medicare Annual Wellness (AWV)  05/28/2022      Past Medical History:  Diagnosis  Date   Allergic rhinitis, cause unspecified    Arthritis    Carotid stenosis    Family history of colon cancer 07/21/2013   mother   Gallstone    GERD (gastroesophageal reflux disease)    Hyperlipidemia 08/30/2010   Hypertension    Hypothyroidism    Impaired glucose tolerance    Pain, coccyx    Right lumbar radiculopathy 08/30/2010   Past Surgical History:  Procedure Laterality Date   APPENDECTOMY  1953   CHOLECYSTECTOMY  2002   CHOLECYSTECTOMY OPEN  10 years ago   scarlet fever     age 89   TONSILLECTOMY  1959    reports that she has never smoked. She has never used smokeless tobacco. She reports that she does not drink alcohol and does not use drugs. family history includes Arthritis in her sister; Atrial fibrillation in her sister; Colon cancer in her mother; Diabetes in her daughter and maternal grandmother; Stroke in her mother. Allergies[1] Medications Ordered Prior to Encounter[2]      ROS:  All others reviewed and negative.  Objective        PE:  BP 122/78 (BP Location: Right Arm, Patient Position: Sitting, Cuff Size: Normal)   Pulse 97   Temp 97.7 F (36.5 C) (Oral)   Ht 5' 5 (1.651 m)   Wt 232 lb (105.2 kg)   SpO2 93%   BMI  38.61 kg/m                 Constitutional: Pt appears in NAD, in wheelchair               HENT: Head: NCAT.                Right Ear: External ear normal.                 Left Ear: External ear normal.                Eyes: . Pupils are equal, round, and reactive to light. Conjunctivae and EOM are normal               Nose: without d/c or deformity               Neck: Neck supple. Gross normal ROM               Cardiovascular: Normal rate and regular rhythm.                 Pulmonary/Chest: Effort normal and breath sounds without rales or wheezing.                Abd:  Soft, NT, ND, + BS, no organomegaly               Neurological: Pt is alert. At baseline orientation, motor grossly intact               Skin: Skin is warm. No rashes, no  other new lesions, LE edema -  none               Psychiatric: Pt behavior is normal without agitation   Micro: none  Cardiac tracings I have personally interpreted today:  none  Pertinent Radiological findings (summarize): none   Lab Results  Component Value Date   WBC 8.0 03/15/2024   HGB 14.9 03/15/2024   HCT 44.4 03/15/2024   PLT 237.0 03/15/2024   GLUCOSE 101 (H) 03/15/2024   CHOL 220 (H) 03/15/2024   TRIG 178.0 (H) 03/15/2024   HDL 52.70 03/15/2024   LDLDIRECT 121.0 06/17/2022   LDLCALC 132 (H) 03/15/2024   ALT 21 03/15/2024   AST 18 03/15/2024   NA 138 03/15/2024   K 4.2 03/15/2024   CL 102 03/15/2024   CREATININE 0.62 03/15/2024   BUN 26 (H) 03/15/2024   CO2 27 03/15/2024   TSH 3.50 03/15/2024   HGBA1C 5.7 03/15/2024   Assessment/Plan:  Jenny Acosta is a 79 y.o. White or Caucasian [1] female with  has a past medical history of Allergic rhinitis, cause unspecified, Arthritis, Carotid stenosis, Family history of colon cancer (07/21/2013), Gallstone, GERD (gastroesophageal reflux disease), Hyperlipidemia (08/30/2010), Hypertension, Hypothyroidism, Impaired glucose tolerance, Pain, coccyx, and Right lumbar radiculopathy (08/30/2010).  History of arterial ischemic stroke For neurology f/u per pt reqeust  Encounter for well adult exam with abnormal findings Age and sex appropriate education and counseling updated with regular exercise and diet Referrals for preventative services - for GYN referral Immunizations addressed  - for flu shot Smoking counseling  - none needed Evidence for depression or other mood disorder - none significant Most recent labs reviewed. I have personally reviewed and have noted: 1) the patient's medical and social history 2) The patient's current medications and supplements 3) The patient's height, weight, and BMI have been recorded in the chart   Impaired glucose tolerance Lab Results  Component Value Date  HGBA1C 5.7 03/15/2024    Stable, pt to continue current medical treatment , diet, control   Hypertension BP Readings from Last 3 Encounters:  03/15/24 122/78  03/06/24 (!) 149/65  06/09/23 122/70   Stable, pt to continue medical treatment  - diet,wt control   Hyperlipidemia Lab Results  Component Value Date   LDLCALC 132 (H) 03/15/2024   Uncontrolled, pt to restart crestor  40 mg qd   Solitary pulmonary nodule For CTA aortic, but also refer pulmonary with hx of aspirate pna  Dysphagia Mild to mod, for refer GI and Speech therapy,  to f/u any worsening symptoms or concerns   Forehead laceration No evidence for infection, wound intact, now for stitches out today  Thoracic ascending aortic aneurysm Also for f/u CTA aorta  Followup: Return in about 6 months (around 09/12/2024).  Lynwood Rush, MD 03/15/2024 8:14 PM [facility]     [1]  Allergies Allergen Reactions   Azithromycin     Lisinopril     Cough   Losartan Other (See Comments)    Swelling of face   Methocarbamol Other (See Comments)    hallucinations   Simvastatin      Hair loss   Sulfa Antibiotics     unknown  [2]  Current Outpatient Medications on File Prior to Visit  Medication Sig Dispense Refill   acetaminophen  (TYLENOL ) 500 MG tablet Take 500 mg by mouth every 6 (six) hours as needed. For pain     aspirin  81 MG chewable tablet Chew 1 tablet (81 mg total) by mouth daily.     Cholecalciferol (THERA-D 2000) 50 MCG (2000 UT) TABS 1 tab by mouth epr day (Patient taking differently: Take 1 tablet by mouth daily.) 30 tablet 99   DHA-EPA-Vitamin E (OMEGA-3 COMPLEX PO) Take by mouth.     loratadine  (CLARITIN ) 10 MG tablet Take 1 tablet (10 mg total) by mouth daily. 30 tablet 0   meclizine  (ANTIVERT ) 25 MG tablet TAKE 1 TABLET BY MOUTH THREE TIMES A DAY AS NEEDED FOR DIZZINESS 30 tablet 2   ondansetron  (ZOFRAN -ODT) 4 MG disintegrating tablet TAKE 1 TABLET BY MOUTH  EVERY 8 HOURS AS NEEDED FOR NAUSEA AND VOMITING 30 tablet 2   tiZANidine  (ZANAFLEX ) 4 MG tablet TAKE 1 TABLET BY MOUTH EVERY 6 HOURS AS NEEDED FOR MUSCLE SPASMS. 360 tablet 1   No current facility-administered medications on file prior to visit.

## 2024-03-15 NOTE — Assessment & Plan Note (Signed)
 Also for f/u CTA aorta

## 2024-03-15 NOTE — Assessment & Plan Note (Signed)
 No evidence for infection, wound intact, now for stitches out today

## 2024-03-15 NOTE — Patient Instructions (Signed)
 You had the flu shot today  Your stitches were removed today  Please continue all other medications as before, and refills have been done if requested.  Please have the pharmacy call with any other refills you may need.  Please continue your efforts at being more active, low cholesterol diet, and weight control.  You are otherwise up to date with prevention measures today.  Please keep your appointments with your specialists as you may have planned  You will be contacted regarding the referral for: Pulmonary, GI, Speech Therapy, and GYN  You will be contacted regarding the referral for: CT for the aorta  Please go to the LAB at the blood drawing area for the tests to be done  You will be contacted by phone if any changes need to be made immediately.  Otherwise, you will receive a letter about your results with an explanation, but please check with MyChart first.  Please make an Appointment to return in 6 months, or sooner if needed

## 2024-03-15 NOTE — Assessment & Plan Note (Signed)
 For neurology f/u per pt reqeust

## 2024-03-15 NOTE — Assessment & Plan Note (Signed)
 Lab Results  Component Value Date   HGBA1C 5.7 03/15/2024   Stable, pt to continue current medical treatment , diet, control

## 2024-03-15 NOTE — Assessment & Plan Note (Signed)
 Lab Results  Component Value Date   LDLCALC 132 (H) 03/15/2024   Uncontrolled, pt to restart crestor  40 mg qd

## 2024-03-15 NOTE — Assessment & Plan Note (Signed)
 Age and sex appropriate education and counseling updated with regular exercise and diet Referrals for preventative services - for GYN referral Immunizations addressed  - for flu shot Smoking counseling  - none needed Evidence for depression or other mood disorder - none significant Most recent labs reviewed. I have personally reviewed and have noted: 1) the patient's medical and social history 2) The patient's current medications and supplements 3) The patient's height, weight, and BMI have been recorded in the chart

## 2024-03-15 NOTE — Assessment & Plan Note (Signed)
 BP Readings from Last 3 Encounters:  03/15/24 122/78  03/06/24 (!) 149/65  06/09/23 122/70   Stable, pt to continue medical treatment  - diet,wt control

## 2024-03-15 NOTE — Progress Notes (Signed)
 The test results show that your current treatment is OK, as the tests are stable.  Please continue the same plan.  There is no other need for change of treatment or further evaluation based on these results, at this time.  thanks

## 2024-03-15 NOTE — Assessment & Plan Note (Signed)
 Mild to mod, for refer GI and Speech therapy,  to f/u any worsening symptoms or concerns
